# Patient Record
Sex: Male | Born: 1935 | Race: White | Hispanic: No | Marital: Married | Smoking: Never smoker
Health system: Southern US, Community
[De-identification: ages and names within clinical notes are randomized; demographics above are authoritative.]

## PROBLEM LIST (undated history)

## (undated) DIAGNOSIS — I10 Essential (primary) hypertension: Secondary | ICD-10-CM

## (undated) DIAGNOSIS — K279 Peptic ulcer, site unspecified, unspecified as acute or chronic, without hemorrhage or perforation: Secondary | ICD-10-CM

## (undated) DIAGNOSIS — Z86010 Personal history of colonic polyps: Secondary | ICD-10-CM

## (undated) DIAGNOSIS — R972 Elevated prostate specific antigen [PSA]: Secondary | ICD-10-CM

## (undated) DIAGNOSIS — K648 Other hemorrhoids: Secondary | ICD-10-CM

## (undated) DIAGNOSIS — J383 Other diseases of vocal cords: Secondary | ICD-10-CM

## (undated) DIAGNOSIS — G252 Other specified forms of tremor: Secondary | ICD-10-CM

## (undated) DIAGNOSIS — R498 Other voice and resonance disorders: Secondary | ICD-10-CM

## (undated) DIAGNOSIS — M5416 Radiculopathy, lumbar region: Secondary | ICD-10-CM

## (undated) DIAGNOSIS — M545 Low back pain: Secondary | ICD-10-CM

## (undated) DIAGNOSIS — M21371 Foot drop, right foot: Secondary | ICD-10-CM

## (undated) DIAGNOSIS — I2510 Atherosclerotic heart disease of native coronary artery without angina pectoris: Secondary | ICD-10-CM

## (undated) DIAGNOSIS — R05 Cough: Secondary | ICD-10-CM

## (undated) DIAGNOSIS — G250 Essential tremor: Secondary | ICD-10-CM

## (undated) DIAGNOSIS — Z0000 Encounter for general adult medical examination without abnormal findings: Secondary | ICD-10-CM

## (undated) DIAGNOSIS — E785 Hyperlipidemia, unspecified: Secondary | ICD-10-CM

## (undated) DIAGNOSIS — J309 Allergic rhinitis, unspecified: Secondary | ICD-10-CM

## (undated) DIAGNOSIS — G5733 Lesion of lateral popliteal nerve, bilateral lower limbs: Secondary | ICD-10-CM

## (undated) DIAGNOSIS — R7302 Impaired glucose tolerance (oral): Secondary | ICD-10-CM

## (undated) DIAGNOSIS — F411 Generalized anxiety disorder: Secondary | ICD-10-CM

## (undated) HISTORY — DX: Impaired glucose tolerance (oral): R73.02

## (undated) HISTORY — DX: Personal history of colonic polyps: Z86.010

## (undated) HISTORY — DX: Essential (primary) hypertension: I10

## (undated) HISTORY — DX: Other diseases of vocal cords: J38.3

## (undated) HISTORY — DX: Atherosclerotic heart disease of native coronary artery without angina pectoris: I25.10

## (undated) HISTORY — DX: Other voice and resonance disorders: R49.8

## (undated) HISTORY — DX: Foot drop, right foot: M21.371

## (undated) HISTORY — DX: Lesion of lateral popliteal nerve, bilateral lower limbs: G57.33

## (undated) HISTORY — DX: Hyperlipidemia, unspecified: E78.5

## (undated) HISTORY — DX: Other hemorrhoids: K64.8

## (undated) HISTORY — PX: TONSILLECTOMY: SUR1361

## (undated) HISTORY — DX: Radiculopathy, lumbar region: M54.16

## (undated) HISTORY — DX: Other specified forms of tremor: G25.2

## (undated) HISTORY — DX: Peptic ulcer, site unspecified, unspecified as acute or chronic, without hemorrhage or perforation: K27.9

## (undated) HISTORY — PX: APPENDECTOMY: SHX54

## (undated) HISTORY — PX: OTHER SURGICAL HISTORY: SHX169

## (undated) HISTORY — DX: Cough: R05

## (undated) HISTORY — PX: CORONARY ARTERY BYPASS GRAFT: SHX141

## (undated) HISTORY — PX: BACK SURGERY: SHX140

## (undated) HISTORY — DX: Essential tremor: G25.0

## (undated) HISTORY — DX: Elevated prostate specific antigen (PSA): R97.20

## (undated) HISTORY — DX: Encounter for general adult medical examination without abnormal findings: Z00.00

## (undated) HISTORY — DX: Generalized anxiety disorder: F41.1

## (undated) HISTORY — DX: Low back pain: M54.5

## (undated) HISTORY — DX: Allergic rhinitis, unspecified: J30.9

---

## 1998-03-13 ENCOUNTER — Ambulatory Visit (HOSPITAL_COMMUNITY): Admission: RE | Admit: 1998-03-13 | Discharge: 1998-03-13 | Payer: Self-pay | Admitting: Cardiology

## 1998-10-30 ENCOUNTER — Ambulatory Visit (HOSPITAL_BASED_OUTPATIENT_CLINIC_OR_DEPARTMENT_OTHER): Admission: RE | Admit: 1998-10-30 | Discharge: 1998-10-30 | Payer: Self-pay | Admitting: General Surgery

## 2000-03-26 ENCOUNTER — Encounter: Payer: Self-pay | Admitting: Cardiology

## 2000-03-26 ENCOUNTER — Ambulatory Visit (HOSPITAL_COMMUNITY): Admission: RE | Admit: 2000-03-26 | Discharge: 2000-03-26 | Payer: Self-pay | Admitting: Cardiology

## 2002-05-14 ENCOUNTER — Encounter: Payer: Self-pay | Admitting: Internal Medicine

## 2002-05-14 ENCOUNTER — Encounter: Admission: RE | Admit: 2002-05-14 | Discharge: 2002-05-14 | Payer: Self-pay | Admitting: Internal Medicine

## 2002-07-22 ENCOUNTER — Encounter: Payer: Self-pay | Admitting: Neurosurgery

## 2002-07-26 ENCOUNTER — Inpatient Hospital Stay (HOSPITAL_COMMUNITY): Admission: RE | Admit: 2002-07-26 | Discharge: 2002-07-27 | Payer: Self-pay | Admitting: Neurosurgery

## 2002-07-26 ENCOUNTER — Encounter: Payer: Self-pay | Admitting: Neurosurgery

## 2003-03-23 ENCOUNTER — Encounter: Payer: Self-pay | Admitting: Gastroenterology

## 2004-10-18 ENCOUNTER — Ambulatory Visit: Payer: Self-pay | Admitting: Internal Medicine

## 2004-10-25 ENCOUNTER — Ambulatory Visit: Payer: Self-pay | Admitting: Internal Medicine

## 2004-11-22 ENCOUNTER — Ambulatory Visit: Payer: Self-pay | Admitting: Internal Medicine

## 2004-12-25 ENCOUNTER — Ambulatory Visit: Payer: Self-pay | Admitting: Internal Medicine

## 2005-11-12 ENCOUNTER — Ambulatory Visit: Payer: Self-pay | Admitting: Internal Medicine

## 2005-11-21 ENCOUNTER — Ambulatory Visit: Payer: Self-pay | Admitting: Internal Medicine

## 2005-12-24 ENCOUNTER — Ambulatory Visit: Payer: Self-pay | Admitting: Cardiology

## 2006-11-18 ENCOUNTER — Ambulatory Visit: Payer: Self-pay | Admitting: Internal Medicine

## 2006-11-18 ENCOUNTER — Ambulatory Visit: Payer: Self-pay

## 2006-11-18 ENCOUNTER — Ambulatory Visit: Payer: Self-pay | Admitting: Cardiology

## 2006-11-18 LAB — CONVERTED CEMR LAB
Bilirubin, Direct: 0.1 mg/dL (ref 0.0–0.3)
CO2: 29 meq/L (ref 19–32)
Creatinine, Ser: 1 mg/dL (ref 0.4–1.5)
HDL: 49.9 mg/dL (ref >39.0)
PSA: 1.06 ng/mL (ref 0.10–4.00)
Potassium: 4.8 meq/L (ref 3.5–5.1)
Sodium: 141 meq/L (ref 135–145)
Total Bilirubin: 1.2 mg/dL (ref 0.3–1.2)
Total CHOL/HDL Ratio: 3
Total Protein: 6.2 g/dL (ref 6.0–8.3)
Triglycerides: 61 mg/dL (ref 0–149)

## 2006-12-11 ENCOUNTER — Ambulatory Visit: Payer: Self-pay | Admitting: Cardiology

## 2007-03-03 ENCOUNTER — Encounter: Admission: RE | Admit: 2007-03-03 | Discharge: 2007-03-03 | Payer: Self-pay | Admitting: Neurosurgery

## 2007-04-08 ENCOUNTER — Inpatient Hospital Stay (HOSPITAL_COMMUNITY): Admission: RE | Admit: 2007-04-08 | Discharge: 2007-04-09 | Payer: Self-pay | Admitting: Neurosurgery

## 2007-10-05 IMAGING — CR DG LUMBAR SPINE 2-3V
1 series · 1 of 1 positions shown · non-contrast
Comparison: none

CLINICAL DATA: Lumbar stenosis, L4-5 decompression.
 LUMBAR SPINE ? 2 VIEW:

[view not recorded]
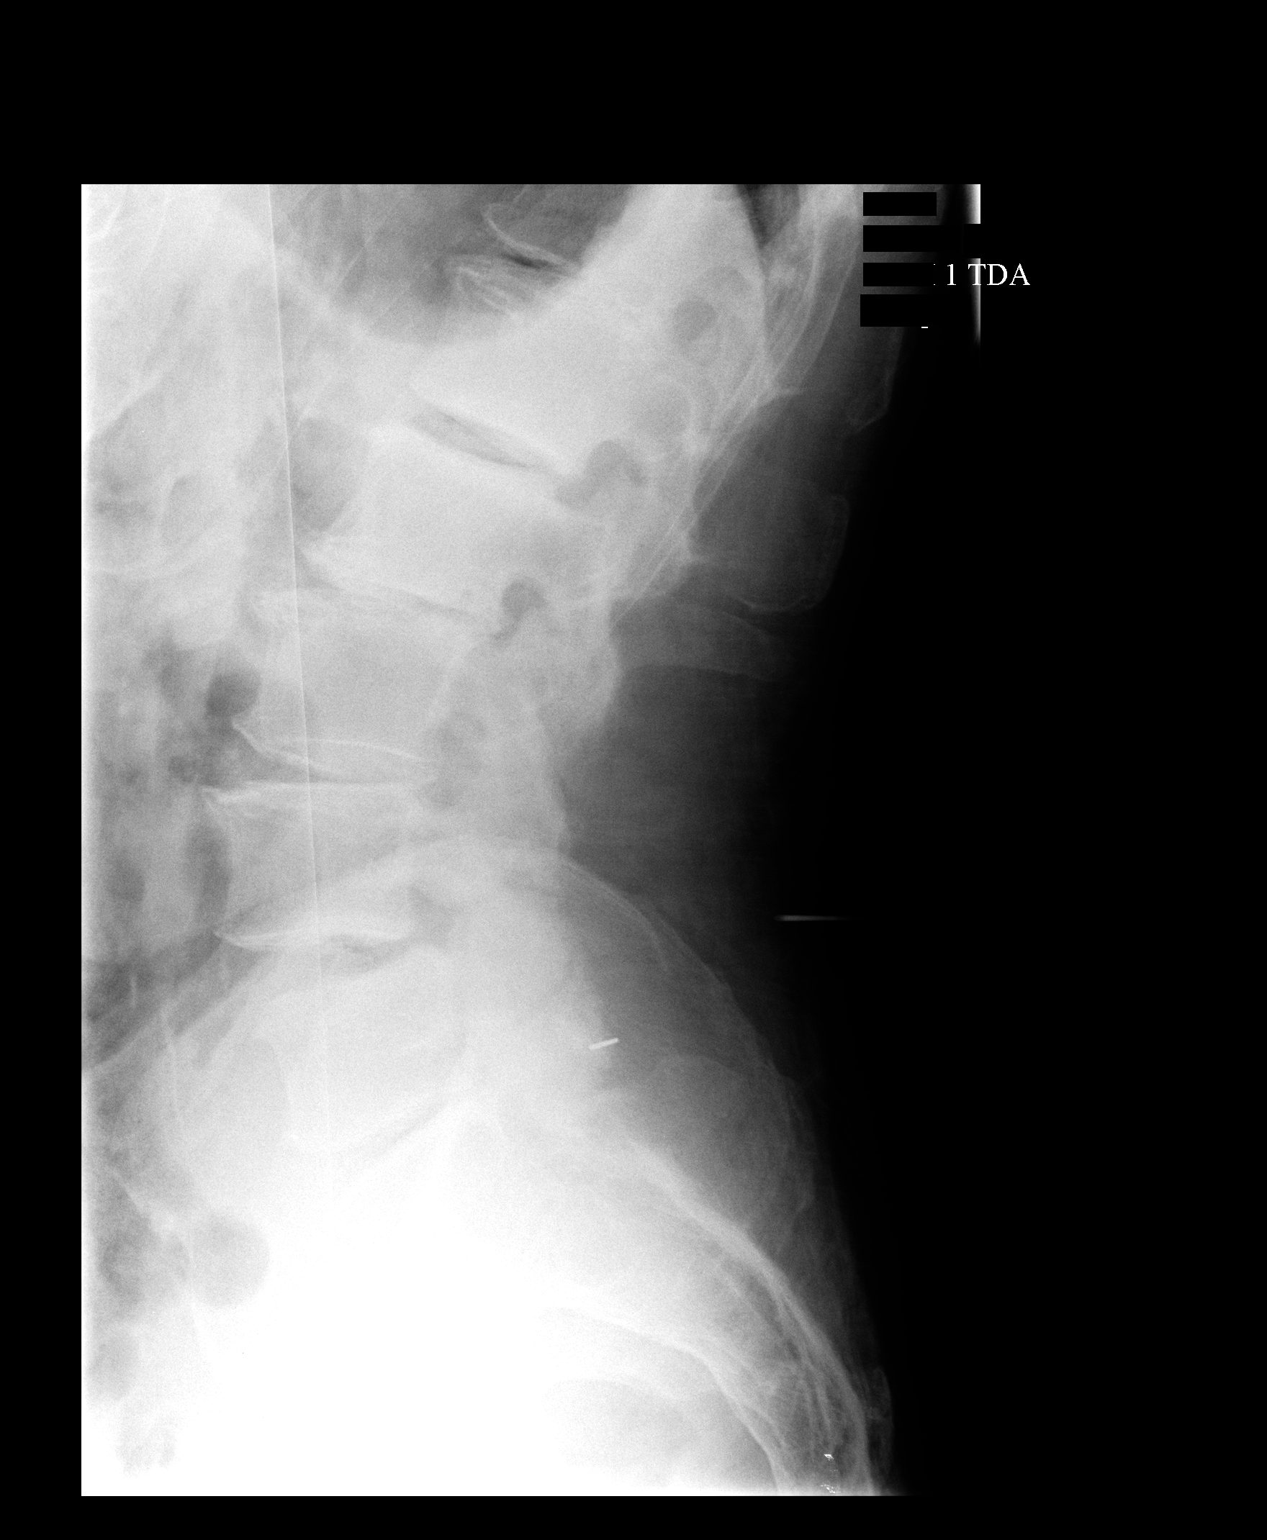

[1 of 1 positions shown; findings below may reference images not displayed]

FINDINGS: Two lateral intraoperative views of the lumbar spine are submitted postoperatively for interpretation.
 Film #1 demonstrates posterior metallic probe directed at the L4-5 interspace.
 Film #2 demonstrates a posterior metallic probe directed at the inferior aspect of L4.
IMPRESSION: Localization.

## 2007-11-25 ENCOUNTER — Ambulatory Visit: Payer: Self-pay | Admitting: Internal Medicine

## 2007-11-25 DIAGNOSIS — I2510 Atherosclerotic heart disease of native coronary artery without angina pectoris: Secondary | ICD-10-CM

## 2007-11-25 DIAGNOSIS — E785 Hyperlipidemia, unspecified: Secondary | ICD-10-CM

## 2007-11-25 DIAGNOSIS — G252 Other specified forms of tremor: Secondary | ICD-10-CM

## 2007-11-25 DIAGNOSIS — Z860100 Personal history of colon polyps, unspecified: Secondary | ICD-10-CM

## 2007-11-25 DIAGNOSIS — F411 Generalized anxiety disorder: Secondary | ICD-10-CM | POA: Insufficient documentation

## 2007-11-25 DIAGNOSIS — J309 Allergic rhinitis, unspecified: Secondary | ICD-10-CM

## 2007-11-25 DIAGNOSIS — G250 Essential tremor: Secondary | ICD-10-CM

## 2007-11-25 DIAGNOSIS — K279 Peptic ulcer, site unspecified, unspecified as acute or chronic, without hemorrhage or perforation: Secondary | ICD-10-CM | POA: Insufficient documentation

## 2007-11-25 DIAGNOSIS — I10 Essential (primary) hypertension: Secondary | ICD-10-CM

## 2007-11-25 DIAGNOSIS — M545 Low back pain, unspecified: Secondary | ICD-10-CM

## 2007-11-25 HISTORY — DX: Essential tremor: G25.0

## 2007-11-25 HISTORY — DX: Essential (primary) hypertension: I10

## 2007-11-25 HISTORY — DX: Allergic rhinitis, unspecified: J30.9

## 2007-11-25 HISTORY — DX: Low back pain, unspecified: M54.50

## 2007-11-25 HISTORY — DX: Atherosclerotic heart disease of native coronary artery without angina pectoris: I25.10

## 2007-11-25 HISTORY — DX: Personal history of colon polyps, unspecified: Z86.0100

## 2007-11-25 HISTORY — DX: Personal history of colonic polyps: Z86.010

## 2007-11-25 HISTORY — DX: Peptic ulcer, site unspecified, unspecified as acute or chronic, without hemorrhage or perforation: K27.9

## 2007-11-25 HISTORY — DX: Generalized anxiety disorder: F41.1

## 2007-11-25 HISTORY — DX: Hyperlipidemia, unspecified: E78.5

## 2007-12-01 LAB — CONVERTED CEMR LAB
AST: 29 units/L (ref 0–37)
Bilirubin, Direct: 0.2 mg/dL (ref 0.0–0.3)
Chloride: 103 meq/L (ref 96–112)
Cholesterol: 159 mg/dL (ref 0–200)
Creatinine, Ser: 1 mg/dL (ref 0.4–1.5)
Eosinophils Absolute: 0.2 10*3/uL (ref 0.0–0.6)
Eosinophils Relative: 3.8 % (ref 0.0–5.0)
GFR calc non Af Amer: 77 mL/min
Glucose, Bld: 113 mg/dL — ABNORMAL HIGH (ref 70–99)
HCT: 40.6 % (ref 39.0–52.0)
Ketones, ur: NEGATIVE mg/dL
Leukocytes, UA: NEGATIVE
Lymphocytes Relative: 20 % (ref 12.0–46.0)
MCV: 100.1 fL — ABNORMAL HIGH (ref 78.0–100.0)
Neutrophils Relative %: 63.1 % (ref 43.0–77.0)
Nitrite: NEGATIVE
PSA: 1.2 ng/mL (ref 0.10–4.00)
RBC: 4.06 M/uL — ABNORMAL LOW (ref 4.22–5.81)
Sodium: 140 meq/L (ref 135–145)
Total Bilirubin: 1.4 mg/dL — ABNORMAL HIGH (ref 0.3–1.2)
Total Protein: 7 g/dL (ref 6.0–8.3)
Urobilinogen, UA: 0.2 (ref 0.0–1.0)
WBC: 5.7 10*3/uL (ref 4.5–10.5)

## 2008-06-13 ENCOUNTER — Encounter: Payer: Self-pay | Admitting: Internal Medicine

## 2008-07-29 ENCOUNTER — Telehealth (INDEPENDENT_AMBULATORY_CARE_PROVIDER_SITE_OTHER): Payer: Self-pay | Admitting: *Deleted

## 2008-08-18 ENCOUNTER — Ambulatory Visit: Payer: Self-pay | Admitting: Cardiology

## 2008-08-19 ENCOUNTER — Ambulatory Visit: Payer: Self-pay | Admitting: Cardiology

## 2008-08-19 LAB — CONVERTED CEMR LAB
ALT: 23 units/L (ref 0–53)
Albumin: 3.5 g/dL (ref 3.5–5.2)
Alkaline Phosphatase: 52 units/L (ref 39–117)
BUN: 16 mg/dL (ref 6–23)
Calcium: 9.2 mg/dL (ref 8.4–10.5)
Eosinophils Relative: 4.7 % (ref 0.0–5.0)
GFR calc Af Amer: 93 mL/min
Glucose, Bld: 100 mg/dL — ABNORMAL HIGH (ref 70–99)
HCT: 39.7 % (ref 39.0–52.0)
Hemoglobin: 13.7 g/dL (ref 13.0–17.0)
Monocytes Absolute: 0.6 10*3/uL (ref 0.1–1.0)
Monocytes Relative: 13.1 % — ABNORMAL HIGH (ref 3.0–12.0)
Neutro Abs: 2.7 10*3/uL (ref 1.4–7.7)
RBC: 3.87 M/uL — ABNORMAL LOW (ref 4.22–5.81)
Total CHOL/HDL Ratio: 3.2
Total Protein: 6.3 g/dL (ref 6.0–8.3)
WBC: 4.8 10*3/uL (ref 4.5–10.5)

## 2008-09-06 ENCOUNTER — Encounter: Payer: Self-pay | Admitting: Internal Medicine

## 2008-10-19 ENCOUNTER — Encounter: Payer: Self-pay | Admitting: Internal Medicine

## 2008-11-22 ENCOUNTER — Telehealth: Payer: Self-pay | Admitting: Internal Medicine

## 2009-01-04 ENCOUNTER — Ambulatory Visit: Payer: Self-pay | Admitting: Internal Medicine

## 2009-01-04 DIAGNOSIS — R259 Unspecified abnormal involuntary movements: Secondary | ICD-10-CM | POA: Insufficient documentation

## 2009-01-04 DIAGNOSIS — R498 Other voice and resonance disorders: Secondary | ICD-10-CM

## 2009-01-04 DIAGNOSIS — R05 Cough: Secondary | ICD-10-CM | POA: Insufficient documentation

## 2009-01-04 DIAGNOSIS — R059 Cough, unspecified: Secondary | ICD-10-CM

## 2009-01-04 HISTORY — DX: Cough, unspecified: R05.9

## 2009-01-04 HISTORY — DX: Other voice and resonance disorders: R49.8

## 2009-01-05 ENCOUNTER — Encounter: Payer: Self-pay | Admitting: Internal Medicine

## 2009-01-05 DIAGNOSIS — R972 Elevated prostate specific antigen [PSA]: Secondary | ICD-10-CM

## 2009-01-05 HISTORY — DX: Elevated prostate specific antigen (PSA): R97.20

## 2009-01-06 LAB — CONVERTED CEMR LAB
Albumin: 3.7 g/dL (ref 3.5–5.2)
Alkaline Phosphatase: 48 units/L (ref 39–117)
Basophils Relative: 0.2 % (ref 0.0–3.0)
Bilirubin Urine: NEGATIVE
Bilirubin, Direct: 0.2 mg/dL (ref 0.0–0.3)
Chloride: 105 meq/L (ref 96–112)
Cholesterol: 170 mg/dL (ref 0–200)
Creatinine, Ser: 1.2 mg/dL (ref 0.4–1.5)
Eosinophils Relative: 3.9 % (ref 0.0–5.0)
GFR calc non Af Amer: 62.37 mL/min (ref >60)
HDL: 63.8 mg/dL (ref >39.00)
Ketones, ur: NEGATIVE mg/dL
Lymphocytes Relative: 28.6 % (ref 12.0–46.0)
MCV: 101.3 fL — ABNORMAL HIGH (ref 78.0–100.0)
Monocytes Absolute: 0.6 10*3/uL (ref 0.1–1.0)
Monocytes Relative: 12.8 % — ABNORMAL HIGH (ref 3.0–12.0)
Neutrophils Relative %: 54.5 % (ref 43.0–77.0)
Nitrite: NEGATIVE
Potassium: 4.9 meq/L (ref 3.5–5.1)
RBC: 4.13 M/uL — ABNORMAL LOW (ref 4.22–5.81)
Total Bilirubin: 1.6 mg/dL — ABNORMAL HIGH (ref 0.3–1.2)
Total Protein, Urine: NEGATIVE mg/dL
Triglycerides: 38 mg/dL (ref 0.0–149.0)
Urine Glucose: NEGATIVE mg/dL
VLDL: 7.6 mg/dL (ref 0.0–40.0)
WBC: 4.4 10*3/uL — ABNORMAL LOW (ref 4.5–10.5)
pH: 7 (ref 5.0–8.0)

## 2009-01-17 ENCOUNTER — Encounter: Payer: Self-pay | Admitting: Internal Medicine

## 2009-01-26 ENCOUNTER — Encounter: Payer: Self-pay | Admitting: Internal Medicine

## 2009-01-27 ENCOUNTER — Ambulatory Visit: Payer: Self-pay | Admitting: Gastroenterology

## 2009-02-17 ENCOUNTER — Ambulatory Visit: Payer: Self-pay | Admitting: Gastroenterology

## 2009-02-17 LAB — HM COLONOSCOPY

## 2009-08-03 ENCOUNTER — Encounter: Payer: Self-pay | Admitting: Internal Medicine

## 2009-09-05 ENCOUNTER — Ambulatory Visit: Payer: Self-pay | Admitting: Cardiology

## 2009-09-21 ENCOUNTER — Telehealth: Payer: Self-pay | Admitting: Internal Medicine

## 2010-01-25 ENCOUNTER — Ambulatory Visit: Payer: Self-pay | Admitting: Internal Medicine

## 2010-01-25 LAB — CONVERTED CEMR LAB
ALT: 24 units/L (ref 0–53)
Basophils Absolute: 0 10*3/uL (ref 0.0–0.1)
Bilirubin, Direct: 0.2 mg/dL (ref 0.0–0.3)
CO2: 29 meq/L (ref 19–32)
Eosinophils Absolute: 0.2 10*3/uL (ref 0.0–0.7)
Eosinophils Relative: 3.3 % (ref 0.0–5.0)
GFR calc non Af Amer: 78.57 mL/min (ref >60)
Glucose, Bld: 94 mg/dL (ref 70–99)
HDL: 60.3 mg/dL (ref >39.00)
Leukocytes, UA: NEGATIVE
MCV: 102.9 fL — ABNORMAL HIGH (ref 78.0–100.0)
Monocytes Absolute: 0.6 10*3/uL (ref 0.1–1.0)
Neutrophils Relative %: 54.7 % (ref 43.0–77.0)
Nitrite: NEGATIVE
PSA: 2 ng/mL (ref 0.10–4.00)
Platelets: 203 10*3/uL (ref 150.0–400.0)
Potassium: 4.9 meq/L (ref 3.5–5.1)
Sodium: 141 meq/L (ref 135–145)
Specific Gravity, Urine: 1.015 (ref 1.000–1.030)
Total Bilirubin: 1.3 mg/dL — ABNORMAL HIGH (ref 0.3–1.2)
VLDL: 8.2 mg/dL (ref 0.0–40.0)
WBC: 4.9 10*3/uL (ref 4.5–10.5)
pH: 6.5 (ref 5.0–8.0)

## 2010-01-30 ENCOUNTER — Ambulatory Visit: Payer: Self-pay | Admitting: Internal Medicine

## 2010-02-06 ENCOUNTER — Encounter (INDEPENDENT_AMBULATORY_CARE_PROVIDER_SITE_OTHER): Payer: Self-pay | Admitting: *Deleted

## 2010-03-16 ENCOUNTER — Encounter: Payer: Self-pay | Admitting: Internal Medicine

## 2010-03-29 ENCOUNTER — Encounter
Admission: RE | Admit: 2010-03-29 | Discharge: 2010-06-12 | Payer: Self-pay | Source: Home / Self Care | Admitting: Neurology

## 2010-08-27 ENCOUNTER — Ambulatory Visit: Payer: Self-pay | Admitting: Cardiology

## 2010-08-27 ENCOUNTER — Encounter: Payer: Self-pay | Admitting: Cardiology

## 2010-08-28 ENCOUNTER — Telehealth: Payer: Self-pay | Admitting: Cardiology

## 2010-09-03 ENCOUNTER — Encounter: Payer: Self-pay | Admitting: Internal Medicine

## 2010-10-14 LAB — CONVERTED CEMR LAB: Folate: >20 ng/mL

## 2010-10-16 NOTE — Progress Notes (Signed)
  Phone Note Refill Request  on September 21, 2009 4:36 PM  Refills Requested: Medication #1:  LOPRESSOR 50 MG  TABS 1 and 1/.2 by mouth qam and 1 by mouth qpm   Dosage confirmed as above?Dosage Confirmed   Last Refilled: 05/2009   Notes: [facility] Initial call taken by: Scharlene Gloss,  September 21, 2009 4:37 PM    Prescriptions: LOPRESSOR 50 MG  TABS (METOPROLOL TARTRATE) 1 and 1/.2 by mouth qam and 1 by mouth qpm  #75 Tablet x 4   Entered by:   Scharlene Gloss   Authorized by:   [name] MD   Signed by:   Scharlene Gloss on 09/21/2009   Method used:   Faxed to ...       [facility]  [address] [phone]* (retail)       [address]       [place], [place]  [address]       Ph: [phone]       Fax: [phone]   RxID:   [id number]

## 2010-10-16 NOTE — Consult Note (Signed)
Summary: [facility]  [facility]   Imported By: Sherian Rein 03/22/2010 12:44:08  _____________________________________________________________________  External Attachment:    Type:   Image     Comment:   External Document

## 2010-10-16 NOTE — Letter (Signed)
Summary: [facility] Consult Scheduled Letter  [facility]  [address] [place], [place] [address]   Phone: [phone]  Fax: [phone]      02/06/2010 MRN: [id number]  [facility] [address] [place], [place]  [address]    Dear Ronald [name],      We have scheduled an appointment for you.  At the recommendation of Dr.Mcihael, we have scheduled you a consult with [facility] (Dr.Yan) on June 16,2011 Thur at 10:45 AM .Their phone number is [phone].If this appointment day and time is not convenient for you, please feel free to call the office of the doctor you are being referred to at the number listed above and reschedule the appointment.  *please arrive 43 Min [facility]  [address], [place], [place] [address] [phone]   Thank you,  Patient Care Coordinator [facility]

## 2010-10-16 NOTE — Assessment & Plan Note (Signed)
Summary: CPX/ SECURE HORIZIONS// NWS  #   Vital Signs:  Patient profile:   77 year old male Height:      68 inches Weight:      183.25 pounds BMI:     27.96 O2 Sat:      97 % on Room air Temp:     97.8 degrees F oral Pulse rate:   54 / minute BP sitting:   128 / 74  (left arm) Cuff size:   regular  Vitals Entered ByZella Ball Ewing (Jan 30, 2010 10:29 AM)  O2 Flow:  Room air  CC: CPX/RE   Primary Care Provider:  [name] MD  CC:  CPX/RE.  History of Present Illness: tremors seems worse to the upper extremties in recent months despite the beta blocker with good compliacne for the lsat several years;  worse with "sensitve times" such as signing the name, drinking toasts of wine, and putting on the golf course;  also with chronic RLE weakness down to 3% due to  peroneal nerve damage;  still goes to the club daily with walking at least a mile per day, and some machine work; pain to the lower back some worse recently with more walking, but can ride the stationary bike without pain - stamina seems ok and no Pt denies CP, sob, doe, wheezing, orthopnea, pnd, worsening LE edema, palps, dizziness or syncope .  Also saw Dr Retta Diones for prostate  Wife ill iwth lung cancer for 1.5 yrs with mult treatment - he denies signifcant depressive symptoms or panic, sleeps well.    Preventive Screening-Counseling & Management      Drug Use:  no.    Problems Prior to Update: 1)  Tremor  (ICD-781.0) 2)  Coronary Artery Disease  (ICD-414.00) 3)  Hyperlipidemia  (ICD-272.4) 4)  Hypertension  (ICD-401.9) 5)  Psa, Increased  (ICD-790.93) 6)  Tremor  (ICD-781.0) 7)  Hoarseness  (ICD-784.49) 8)  Cough  (ICD-786.2) 9)  Preventive Health Care  (ICD-V70.0) 10)  Low Back Pain  (ICD-724.2) 11)  Allergic Rhinitis  (ICD-477.9) 12)  Anxiety  (ICD-300.00) 13)  Peptic Ulcer Disease  (ICD-533.90) 14)  Colonic Polyps, Hx of  (ICD-V12.72) 15)  Preventive Health Care  (ICD-V70.0) 16)  Tremor, Essential   (ICD-333.1)  Medications Prior to Update: 1)  Lopressor 50 Mg  Tabs (Metoprolol Tartrate) .[name].. 1 and 1/.2 By Mouth Qam and 1 By Mouth Qpm 2)  Adult Aspirin Low Strength 81 Mg  Tbdp (Aspirin) .... Take 1 Tablet By Mouth Once A Day 3)  Lisinopril 20 Mg  Tabs (Lisinopril) .... Take 1 Tablet By Mouth Two Times A Day 4)  Crestor 40 Mg Tabs (Rosuvastatin Calcium) .... 1/2  By Mouth Once Daily 5)  Omega 3 1200 Mg  Caps (Omega-3 Fatty Acids) .... Take 3 Tablet By Mouth Once A Day 6)  Calcium 1200 + Vitamin D 250 .... Take 3 Tablet By Mouth Once A Day 7)  Vitamin B-12 1000 Mcg Tabs (Cyanocobalamin) .[name].. 1 By Mouth Once Daily 8)  Multi V .... 1 Tab Once Daily  Current Medications (verified): 1)  Lopressor 50 Mg  Tabs (Metoprolol Tartrate) .[name].. 1 and 1/.2 By Mouth Qam and 1 By Mouth Qpm 2)  Adult Aspirin Low Strength 81 Mg  Tbdp (Aspirin) .... Take 1 Tablet By Mouth Once A Day 3)  Lisinopril 20 Mg  Tabs (Lisinopril) .... Take 1 Tablet By Mouth Two Times A Day 4)  Crestor 40 Mg Tabs (Rosuvastatin Calcium) .Marland KitchenMarland KitchenMarland Kitchen  1/2  By Mouth Once Daily 5)  Omega 3 1200 Mg  Caps (Omega-3 Fatty Acids) .... Take 3 Tablet By Mouth Once A Day 6)  Calcium 1200 + Vitamin D 250 .... Take 3 Tablet By Mouth Once A Day 7)  Vitamin B-12 1000 Mcg Tabs (Cyanocobalamin) .[name].. 1 By Mouth Once Daily 8)  Multi V .... 1 Tab Once Daily  Allergies (verified): 1)  ! Lipitor 2)  ! Zocor 3)  ! * Niaspan  Past History:  Past Surgical History: Last updated: 11/25/2007 s/p lumbar laminectomy and fusion Appendectomy Tonsillectomy Coronary artery bypass graft  Family History: Last updated: 11/25/2007 father with colon cancer  Social History: Last updated: 01/30/2010 Never Smoked Alcohol use-yes son is Administrator, arts in [place] Drug use-no  Risk Factors: Smoking Status: never (11/25/2007)  Past Medical History: tremor Hyperlipidemia Hypertension Colonic polyps, hx of lumbar spinal stenosis Coronary artery disease Peptic  ulcer disease hx of h pylori tx chronic RLE weakness/right foot drop Anxiety Allergic rhinitis Low back pain - chronic 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 Coronary artery disease status post coronary bypass surgery in 1998     with documented occlusion of the vein graft to the right coronary     artery and the vein graft to the diagonal branch of the left     anterior descending coronary artery, but with nonobstructive     disease of the native vessels. 2. Good left ventricular function. 3. Hyperlipidemia. 4. Intolerance to LIPITOR due to elevated CKs. 5. Lumbar spine disease status post laminectomy and fusion, July 2009.   Social History: Reviewed history from 01/04/2009 and no changes required. Never Smoked Alcohol use-yes son is Administrator, arts in [place] Drug use-no Drug Use:  no  Review of Systems  The patient denies anorexia, fever, vision loss, decreased hearing, hoarseness, chest pain, syncope, dyspnea on exertion, peripheral edema, prolonged cough, headaches, hemoptysis, abdominal pain, melena, hematochezia, severe indigestion/heartburn, hematuria, muscle weakness, suspicious skin lesions, transient blindness, difficulty walking, depression, unusual weight change, abnormal bleeding, enlarged lymph nodes, and angioedema.         all otherwise negative per pt -    Physical Exam  General:  alert and well-developed.   Head:  normocephalic and atraumatic.   Eyes:  vision grossly intact, pupils equal, and pupils round.   Ears:  R ear normal and L ear normal.   Nose:  no external deformity and no nasal discharge.   Mouth:  no gingival abnormalities and pharynx pink and moist.   Neck:  supple and no masses.   Lungs:  normal respiratory effort and normal breath sounds.   Heart:  normal rate and regular rhythm.   Abdomen:  soft, non-tender, and normal bowel sounds.   Msk:  no joint tenderness and no joint swelling.     Extremities:  no edema, no erythema  Neurologic:  cranial nerves II-XII intact and strength normal in all extremities.  , head and bilat upper extrem's tremor noted   Impression & Recommendations:  Problem # 1:  Preventive Health Care (ICD-V70.0)  Overall doing well, age appropriate education and counseling updated and referral for appropriate preventive services done unless declined, immunizations up to date or declined, diet counseling done if overweight, urged to quit smoking if smokes , most recent labs reviewed and current ordered if appropriate, ecg reviewed or declined (interpretation per ECG scanned in the EMR if done); information regarding Medicare Prevention requirements given if appropriate; speciality referrals  updated as appropriate   Orders: EKG w/ Interpretation (93000)  Problem # 2:  HYPERLIPIDEMIA (ICD-272.4)  His updated medication list for this problem includes:    Crestor 40 Mg Tabs (Rosuvastatin calcium) .[name]... 1/2  by mouth once daily tolerated current dose, but has been intolerant of mult other meds, declines increase statin  Problem # 3:  TREMOR (ICD-781.0)  worsening, also noted macrocytosis today - to check b12, refer neurology  Orders: Neurology Referral (Neuro) TLB-B12 + Folate Pnl ([id number])  Complete Medication List: 1)  Lopressor 50 Mg Tabs (Metoprolol tartrate) .[name].. 1 and 1/.2 by mouth qam and 1 by mouth qpm 2)  Adult Aspirin Low Strength 81 Mg Tbdp (Aspirin) .... Take 1 tablet by mouth once a day 3)  Lisinopril 20 Mg Tabs (Lisinopril) .... Take 1 tablet by mouth two times a day 4)  Crestor 40 Mg Tabs (Rosuvastatin calcium) .... 1/2  by mouth once daily 5)  Omega 3 1200 Mg Caps (Omega-3 fatty acids) .... Take 3 tablet by mouth once a day 6)  Calcium 1200 + Vitamin D 250  .... Take 3 tablet by mouth once a day 7)  Vitamin B-12 1000 Mcg Tabs (Cyanocobalamin) .[name].. 1 by mouth once daily 8)  Multi V  .... 1 tab once daily  Patient  Instructions: 1)  Please go to the Lab in the basement for your blood  tests today  - the b12 level 2)  You will be contacted about the referral(s) to: neurology 3)  Continue all previous medications as before this visit 4)  Please schedule a follow-up appointment in 1 year or sooner if needed 5)  remember, you only take HALF of the crestor Prescriptions: CRESTOR 40 MG TABS (ROSUVASTATIN CALCIUM) 1  by mouth once daily  #90 x 3   Entered and Authorized by:   [name] MD   Signed by:   [name] MD on 01/30/2010   Method used:   Print then Give to Patient   RxID:   [id number]

## 2010-10-18 NOTE — Letter (Signed)
Summary: [facility]  [facility]   Imported By: Lester Bartelso 09/11/2010 08:50:01  _____________________________________________________________________  External Attachment:    Type:   Image     Comment:   External Document

## 2010-10-18 NOTE — Progress Notes (Signed)
Summary: disability papers    Phone Note Call from Patient Call back at Home Phone [phone]   Caller: Patient Reason for Call: Talk to Nurse Summary of Call: Pt states the doctor gave him the wrong disability papers with the wrong name. Pt would like to have papers sent to his home.  Initial call taken by: Roe Coombs,  August 28, 2010 8:11 AM  Follow-up for Phone Call        Pt was in the office yesterday and was given a handicap form for Rosine Beat Horner and not him.  He is going to mail back the wrong form and would like the correct form done and mailed to him  will have Dr bridie sign on Friday and mail to pt  Pt aware Dennis Bast, RN, BSN  August 28, 2010 8:29 AM

## 2010-10-18 NOTE — Assessment & Plan Note (Signed)
Summary: [name]      Allergies Added:   Visit Type:  Follow-up Primary Ronald [name]:  Ronald Levins MD   History of Present Illness: The patient is 77 years old and return for management of CAD. He had bypass surgery in 1998. His last catheterization was in 2003 at which time he had total occlusion of the vein graft to the right coronary and good LV function. Get a negative Myoview in 2008.  He's been doing quite well has had no recent chest pain shortness of breath or palpitations. Despite limitations from his back problem he has been able to exercise regularly without symptoms.  his back problem is his major limitation.  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 His wife has been diagnosed with lung cancer. She is undergoing chemotherapy.  Current Medications (verified): 1)  Lopressor 50 Mg  Tabs (Metoprolol Tartrate) .[name].. 1 and 1/.2 By Mouth Qam and 1 By Mouth Qpm 2)  Adult Aspirin Low Strength 81 Mg  Tbdp (Aspirin) .... Take 1 Tablet By Mouth Once A Day 3)  Lisinopril 20 Mg  Tabs (Lisinopril) .... Take 1 Tablet By Mouth Two Times A Day 4)  Crestor 40 Mg Tabs (Rosuvastatin Calcium) .... 1/2  By Mouth Once Daily 5)  Omega 3 1200 Mg  Caps (Omega-3 Fatty Acids) .... Take 3 Tablet By Mouth Once A Day 6)  Vitamin B-12 1000 Mcg Tabs (Cyanocobalamin) .[name].. 1 By Mouth Once Daily 7)  Multi V .... 1 Tab Once Daily  Allergies (verified): 1)  ! Lipitor 2)  ! Zocor 3)  ! * Niaspan  Past History:  Past Medical History: Reviewed history from 01/30/2010 and no changes required. 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 Coronary artery disease status post coronary bypass surgery in 1998     with documented occlusion of the vein graft to the right coronary     artery and the vein graft to the diagonal branch of the left     anterior descending coronary artery, but with nonobstructive     disease of the native vessels. 2. Good left ventricular function. 3. Hyperlipidemia. 4. Intolerance to LIPITOR due to elevated CKs. 5. Lumbar spine disease status post laminectomy and fusion, July 2009.   Review of Systems       ROS is negative except as outlined in HPI.   Vital Signs:  Patient profile:   77 year old male Height:      68 inches Weight:      185 pounds Pulse rate:   76 / minute Pulse rhythm:   regular BP sitting:   142 / 70  (right arm)  Vitals Entered By: Jacquelin Hawking, CMA (August 27, 2010 4:21 PM)  Physical Exam  Additional Exam:  Gen. Well-nourished, in no distress   Neck: No JVD, thyroid not enlarged, no carotid bruits Lungs: No tachypnea, clear without rales, rhonchi or wheezes Cardiovascular: Rhythm regular, PMI not displaced,  heart sounds  normal, no murmurs or gallops, no peripheral edema, pulses normal in all 4 extremities. Abdomen: BS normal, abdomen soft and non-tender without masses or organomegaly, no hepatosplenomegaly. MS: No deformities, no  cyanosis or clubbing   Neuro:  No focal sns   Skin:  no lesions    Impression & Recommendations:  Problem # 1:  CORONARY ARTERY DISEASE (ICD-414.00)  He had previous bypass surgery and has one known occluded graft. He is quite active and has had no symptoms of chest pain. This problem appears stable. His updated medication list for this problem includes:    Lopressor 50 Mg Tabs (Metoprolol tartrate) .[name]... 1 and 1/.2 by mouth qam and 1 by mouth qpm    Adult Aspirin Low Strength 81 Mg Tbdp (Aspirin) .[name]... Take 1 tablet by mouth once a day    Lisinopril 20 Mg Tabs (Lisinopril) .[name]... Take 1 tablet by  mouth two times a day  Orders: EKG w/ Interpretation (93000)  Problem # 2:  HYPERTENSION (ICD-401.9) This is well controlled on current medications. His updated medication list for this problem includes:    Lopressor 50 Mg Tabs (Metoprolol tartrate) .[name]... 1 and 1/.2 by mouth qam and 1 by mouth qpm    Adult Aspirin Low Strength 81 Mg Tbdp (Aspirin) .[name]... Take 1 tablet by mouth once a day    Lisinopril 20 Mg Tabs (Lisinopril) .[name]... Take 1 tablet by mouth two times a day  Problem # 3:  HYPERLIPIDEMIA (ICD-272.4) His last lipid profile about 6 months ago was very good. We will continue current therapy. His updated medication list for this problem includes:    Crestor 40 Mg Tabs (Rosuvastatin calcium) .[name]... 1/2  by mouth once daily  Patient Instructions: 1)  Your physician recommends that you continue on your current medications as directed. Please refer to the Current Medication list given to you today. 2)  Your physician wants you to follow-up in: 1 year with Dr. Excell Seltzer.  You will receive a reminder letter in the mail two months in advance. If you don't receive a letter, please call our office to schedule the follow-up appointment.

## 2010-11-06 ENCOUNTER — Telehealth: Payer: Self-pay | Admitting: Internal Medicine

## 2010-11-06 DIAGNOSIS — K648 Other hemorrhoids: Secondary | ICD-10-CM | POA: Insufficient documentation

## 2010-11-06 HISTORY — DX: Other hemorrhoids: K64.8

## 2010-11-13 NOTE — Progress Notes (Signed)
Summary: OV?   Phone Note Call from Patient Call back at [facility] Phone [phone]   Summary of Call: Pt left vm - wants apt for eval of hemorrhoids if Dr Jonny Ruiz would see him for this issue.  Initial call taken by: Lamar Sprinkles, CMA,  November 06, 2010 10:01 AM  Follow-up for Phone Call        we can, but I should say if any surgury is involved, we may need to refer him to a surgeon Follow-up by: [name] MD,  November 06, 2010 11:30 AM  Additional Follow-up for Phone Call Additional follow up Details #1::        Pt wants referral to Dr Abbey Chatters at [facility]. Per pt Dr Abbey Chatters did previous hemorrhoid surgery 6 years ago. Pt will expect call from [facility] with appt info Additional Follow-up by: Margaret Pyle, CMA,  November 06, 2010 11:54 AM  New Problems: HEMORRHOIDS, INTERNAL (ICD-455.0)   Additional Follow-up for Phone Call Additional follow up Details #2::    ok - wil do Follow-up by: [name] MD,  November 06, 2010 1:17 PM  New Problems: HEMORRHOIDS, INTERNAL (ICD-455.0)

## 2010-11-19 ENCOUNTER — Telehealth: Payer: Self-pay | Admitting: Internal Medicine

## 2010-11-27 NOTE — Progress Notes (Signed)
Summary: REFERRAL   Phone Note Call from Patient   Summary of Call: Pt is set up for [facility] at the end of the month. Pt learned about a banding procedure by Dr Velna Ochs and are willing to see pt. If ok, MD needs to send referral to Dr Kinnie Scales for hemorrhoid banding procedure, including copies of last colonoscopy.  Initial call taken by: Lamar Sprinkles, CMA,  November 19, 2010 9:45 AM  Follow-up for Phone Call        ok - I will do referral  dahlia to fax results last colonscopy Follow-up by: [name] MD,  November 19, 2010 10:12 AM  Additional Follow-up for Phone Call Additional follow up Details #1::        results faxed Additional Follow-up by: Margaret Pyle, CMA,  November 19, 2010 11:34 AM

## 2011-01-15 ENCOUNTER — Other Ambulatory Visit: Payer: Self-pay | Admitting: Internal Medicine

## 2011-01-29 NOTE — Assessment & Plan Note (Signed)
[facility]                            CARDIOLOGY OFFICE NOTE   CARVIN, ALMAS                        MRN:          [id number]  DATE:08/18/2008                            DOB:          October 26, 1929    PRIMARY CARE PHYSICIAN:  [name], MD   Mr. Khawaja is a 77 year old who returned for management of his coronary  heart disease.  He had bypass surgery and was last cathed in 2003, at  which time he had occlusion of the vein graft to the right coronary  artery, but it is mainly circulation and only nonobstructive disease.  He had a Myoview scan in 2008, which showed no evidence of ischemia and  good LV function.   He has done very well from the standpoint of his heart.  He had no chest  pain, shortness breath, or palpitations.  He works out regularly at [employer].   His past medical history is significant for lumbar spine disease.  In  July, he had a lumbar laminectomy and fusion with Dr. Newell Coral.  He has  a footdrop on his right, which is not recovering, and he indicated that  he may have to have additional surgery.  His past history is also  significant for hyperlipidemia.   SOCIAL HISTORY:  Mr. 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  He exercises regularly.  He does not smoke.   ALLERGIES:  He has an intolerance to LIPITOR, but is tolerating Crestor  well.   CURRENT MEDICATIONS:  1. Aspirin 81 mg daily.  2. Lisinopril 20 mg daily.  3. Crestor 10 mg daily.  4. Omega-3.  5. Glucosamine.  6. Calcium.  7. Metoprolol 50 mg 2-1/2 tablets daily.   PHYSICAL EXAMINATION:  Blood pressure is 149/74 and the pulse 67 and  regular.  There is no venous distension.  The carotid pulses are full  without bruits.  The chest is clear.  Cardiac rhythm is regular.  No  murmurs or gallops.  The abdomen is soft, normal bowel sounds.  Peripheral pulses are full.  There are no peripheral edema.   Electrocardiogram showed left axis deviation, right bundle-branch block,  and had not changed.   IMPRESSION:  1. Coronary artery disease status post coronary bypass surgery in 1998      with documented occlusion of the vein graft to the right coronary      artery and the vein graft to the diagonal branch of the left      anterior descending coronary artery, but with nonobstructive      disease of the native vessels.  2. Good left ventricular function.  3. Hyperlipidemia.  4. Intolerance to LIPITOR due to elevated CKs.  5. Lumbar spine disease status post laminectomy and fusion, July 2009.   RECOMMENDATIONS:  I think, Mr. Wichmann is doing quite well.  We will plan  to get a fasting lipid, liver, CBC, and BNP tomorrow.  I plan to see him  back in a year.     Bruce Elvera Lennox  Juanda Chance, MD, [facility]  Electronically Signed    BRB/MedQ  DD: 08/18/2008  DT: 08/18/2008  Job #: [id number]

## 2011-01-29 NOTE — Op Note (Signed)
NAMEMarland Kitchen  TRIPP, GOINS NO.:  [id number]   MEDICAL RECORD NO.:  [id number]          PATIENT TYPE:  INP   LOCATION:  3172                         FACILITY:  [facility]   PHYSICIAN:  Hewitt Shorts, M.D.DATE OF BIRTH:  May 24, 1930   DATE OF PROCEDURE:  DATE OF DISCHARGE:                               OPERATIVE REPORT   PREOPERATIVE DIAGNOSES:  1. 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  Multiple      degenerative synovial cysts.  2. Lumbar stenosis.  3. Lumbar spondylosis.  4. Lumbar degenerative disk disease.  5. Lumbar radiculopathy.   POSTOPERATIVE DIAGNOSES:  1. 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  Multiple      degenerative synovial cysts.  2. Lumbar stenosis.  3. Lumbar spondylosis.  4. Lumbar degenerative disk disease.  5. Lumbar radiculopathy.   PROCEDURES:  1. Bilateral L4 and L5 decompressive lumbar laminectomy.  2. Resection of multiple degenerative synovial cysts with      microdissection.  3. Bilateral L4-5 posterior lumbar interbody fusion with ACS peak      interbody implants and VITOSS.  4. Bilateral L4-5 posterior arthrodesis with radius posterior disk      rotation and VITOSS with Infuse.   SURGEON:  Nudelman.   ASSISTANT:  Jerald Kief, NP and Elsner.   ANESTHESIA:  General endotracheal.   HISTORY OF PRESENT ILLNESS:  The patient is a 77 year old man, who is  status post L2 to L5 decompressive lumbar laminectomy nearly 5 years  ago.  He has developed low back and bilateral buttock pain.  Restudy  shows static degenerative L4-5 spondylolisthesis with recurrent stenosis  and multiple synovial cysts, causing severe multifactorial lumbar  stenosis with underlying spondylosis and degenerative disk disease.  A  decision was made to proceed with decompression and arthrodesis at the  L4-5 level.   PROCEDURE:  Patient was brought to the operating room and placed under  general endotracheal anesthesia.  Patient was turned  to a prone  position.  Lumbar region was prepped with Betadine and saline solution,  draped in a sterile fashion.  The midline was then infiltrated with  local anesthetic with epinephrine.  An x-ray was taken, the L4-5 level  identified, and a midline made through the previous midline incision  above the L4-5 level, and carried down to the subcutaneous tissue.  Bipolar cautery and electrocautery was used to maintain hemostasis.  We  dissected down the midline surgical plane through scar tissue and then  began to dissect laterally to either side, identifying the facet  complexes laterally.  We also identified the remaining portion of the L2  spinous process.  We then began to dissect from lateral to medial,  exposing the more medial aspect of the facet and then residual lamina  and dystrophic bone that had filled into the laminectomy defect.  Using  magnification and with microdissection and microsurgical technique, we  exposed the bony edges and separated the scar tissue from the bony edges  of the residual bone and calcified scar tissue.  We were able to  carefully dissect and begin to remove some of the hypertrophic  facet and  then began to expose the lateral margin of the thecal sac.  We  progressively dissected and exposed the L4 and L5 nerve roots  bilaterally.  The residual facets and lamina were severely spondylitic  and hypertrophic and these were carefully removed, taking care to leave  the underlying dura undisturbed.  We found areas of substantial synovial  cyst development over the left L5 nerve root within the origin of the  nerve root foramen and in the lateral aspect of the right side of the  spinal canal, compressing the thecal sac and exiting L4 nerve root and  further synovial cysts laterally within the right L4-5 neural foramen.  In the end, we were able to remove all of the synovial cysts and to  further decompress the thecal sac and exiting nerve roots.  We then   identified the annulus of the L4-5 disk.  It was markedly degenerated  and we proceeded with a bilateral diskectomy at L4-5, incising the  annulus and using a variety of curettes, pituitary rongeurs and  osteophyte removal tools.  In doing so, we were able to remove the  spondylotic overgrowth that was compressing the exiting nerve roots  ventrally.  In the end, we performed a thorough diskectomy at L4-5,  removing all loose fragments, disk material and began to prepare the end  plates for interbody arthrodesis using a variety of paddle curettes to  scrape away the remaining cartilaginous surface down to a good bony  surface.  We then measured the heights of the intervertebral disk spaces  and selected 8 mm implants.  We then used a 10 mL strip of VITOSS that  was saturated with blood and packed two 8 mm implants.  The first  implant was placed on the left side, carefully retracting the thecal sac  and nerve root and then placing the implant in the interbody spacing  countersinking it.  We then went to the right side, packed additional  VITOSS within the midline and then subsequently placed the right-sided  implant, again carefully retracting the thecal sac and nerve root.  Again, the right sided implant was countersunk.  We then draped the C-  arm fluoroscope.  It was brought into the field to provide guidance in  placing the pedicle screws.  Pedicle entry sites were identified  bilaterally at L4 and L5 and each of the pedicles probed.  They were  examined with a ball probe.  No cutouts were found and then each of the  pedicles was tapped with a 5.25 mm tap, again examined with a ball  probe.  No cutouts were found and then we placed 5.75 x 45-mm screws  bilaterally at each level.  The C-arm fluoroscope showed good  positioning of the pedicle screws.  We had previously exposed the  transverse processes of L4 and L5 bilaterally.  They had been  decorticated and then we packed Infuse and  additional VITOSS in the  lateral gutters over the transverse processes and into transverse space.  We again examined the spinal canal, the thecal sac and exiting L4 and L5  nerve roots.  Good decompression of these structures was again confirmed  and then we proceeded with closure.  The deep fascia closed with  interrupted undyed 1 Vicryl sutures.  Scarpa's fascia closed with  interrupted undyed inverted 1 Vicryl sutures and the subcutaneous and  subcuticular layer closed with interrupted inverted 2-0 undyed Vicryl  sutures and the skin was reapproximated with surgical staples  and was  dressed with Adaptic and sterile gauze.  Procedure was tolerated well.  The estimated blood loss was 200 ml.  We did use a Cell Saver during the  procedure, but the CRNA and Cell Saver technician both felt that there  was insufficient blood process to process it.  Sponge and needle count  were correct.  Following surgery, the patient was turned back to the  supine position to be reversed from anesthetic, extubated, and  transferred to the recovery room for further care.      Hewitt Shorts, M.D.  Electronically Signed     RWN/MEDQ  D:  04/08/2007  T:  04/09/2007  Job:  [id number]   cc:   Hewitt Shorts, M.D.

## 2011-01-29 NOTE — H&P (Signed)
NAMEMarland [name]  Ronald [name] NO.:  [id number]   MEDICAL RECORD NO.:  [id number]          PATIENT TYPE:  INP   LOCATION:  2899                         FACILITY:  [facility]   PHYSICIAN:  Hewitt Shorts, M.D.DATE OF BIRTH:  09/25/1929   DATE OF ADMISSION:  04/08/2007  DATE OF DISCHARGE:                              HISTORY & PHYSICAL   HISTORY OF PRESENT ILLNESS:  Patient is a 77 year old right-handed white  male who presented with low back pain extending into the upper buttocks  bilaterally which has been worsening over the past year-and-a-half.  He  is status post L2-L5 decompressive lumbar laminectomy for decompression  of lumbar stenosis in November of 2003.  He had previous surgery with  Dr. Ples Specter on the right side of the L5-S1 in 1975.   The patient was evaluated with x-rays and MRI scan.  The x-rays show the  previous L2-L5 decompressive lumbar laminectomy and show listhesis of L4-  5, retrolisthesis of L2-3 and retrolisthesis of L3-4, all of these are  static to flexion and extension.  There is extensive spondylytic  overgrowth and spurring and multilevel degenerative disc disease.  MRI  scan reconfirms the anterolisthesis of L4-5 with severe facet  arthropathy with recurrent stenosis due to combination of synovial cyst  formation of facet arthropathy as well as the anterolisthesis.  It was  felt that the L2-3 and L3-4 levels remain adequately decompressed and  that his symptoms are from advanced progressive degenerative changes at  L4-5.  He is admitted now for L4-5 lumbar laminectomy, fasciectomy,  microdiskectomy, posterior lumbar interbody fusion with interbody  implants and bone graft, and L4-5 posterior arthrodesis with posterior  instrumentation and bone graft as well as resection of his synovial  cyst, and is now admitted for such.   PAST MEDICAL HISTORY:  Notable for:  1. History of hypertension.  2. Hypercholesterolemia.   No history of  myocardial infarction, cancer, stroke, diabetes, peptic  ulcer disease or lung disease.   PREVIOUS SURGERY:  Includes:  1. His L2-L5 decompression lumbar laminectomy November of 2003.  2. His right L5-S1 discectomy in 1975.  3. Cardiac bypass surgery by Dr. Donata Clay in 1999.   He denies allergies to medications.   CURRENT MEDICATIONS:  Include:  1. Crestor 10 mg once a day.  2. Metoprolol 50 mg once a day.  3. Lisinopril 20 mg b.i.d.  4. Aspirin 81 mg once a day.   FAMILY HISTORY:  His parents have passed on.   SOCIAL HISTORY:  Patient is married.  He is retired.  He does not smoke.  He drinks alcoholic beverages socially.  He denies history of substance  abuse.   REVIEW OF SYSTEMS:  Notable for those that are described in his History  of Present Illness and Past Medical History, but his review of systems  is otherwise unremarkable.   PHYSICAL EXAMINATION:  GENERAL:  The patient is a well-developed, well-  nourished white male in no acute distress.  VITAL SIGNS:  Temperature is 98.2, pulse 66, blood pressure 132/76,  respiratory rate 18, height 5  feet 10 inches, weight 175 pounds.  LUNGS:  Clear to auscultation.  He has symmetric respiratory excursion.  HEART:  Regular rate and rhythm.  Normal S1-S2.  There is no murmur.  EXTREMITY EXAMINATION:  Shows no clubbing, cyanosis or edema.  MUSCULOSKELETAL EXAMINATION:  Shows no tenderness to palpation over the  lumbar spinous process or paralumbar musculature.  He is able to flex to  nearly 90 degrees, able to extend fairly well.  Straight leg raising is  negative bilaterally.  NEUROLOGIC EXAMINATION:  Shows 5/5 strength in the iliopsoas and the  quadriceps bilaterally, however the left dorsiflexion was 4, the right  dorsiflexion was 0 to 1.  The left extensor hallucis longus is 4-, the  right is 0 to 1.  Plantar flexor is 5 bilaterally.  Sensation is intact  to pinprick through the triceps and feet bilaterally.  Reflexes:  The   left quadriceps is absent, the right is trace.  Gastrocnemius are absent  bilaterally.  Toes are equivocal bilaterally.  He has normal gait and  stance.   IMPRESSION:  Patient with low back and upper buttock pain with extensive  spondylosis and degenerative disc disease status post L2-L3  decompressive lumbar laminectomy 4 1/2 years ago.  Current studies show  grade 1 anterolisthesis at L4-5 secondary to severe facet arthropathy  with significant recurrent stenosis at that level due to a combination  of synovial cyst and facet arthropathy and hypertrophy causing dorsal  encroachment of the thecal sac and probable encroachment of the right  side encroaching on the right L5 nerve root.   PLAN:  Patient will be admitted for a decompression of L4-5 with  laminectomy, fasciectomy, resection of synovial cyst, microdiskectomy  and stabilization with posterior lumbar interbody fusion and  posterolateral arthrodesis with interbody implants, posterior  instrumentation and bone graft.  We discussed the nature and extent of  such a surgery using his x-rays, MRI scan and models in the office.  We  discussed the nature of the surgery, hospital stay and overall  recuperation, his limitations postoperatively, the need for  postoperative immobilization, lumbar corset and risks of surgery  including risk of infection, bleeding, risks of nerve root dysfunction  with pain, weakness, numbness or paresthesias, the risk of jaw tear and  CSF leakage and risk of failure of the arthrodesis and anesthetic risk  of myocardial infarction, stroke, pneumonia and death.   Understanding all of this, the patient would like to go ahead with  surgery and is admitted for such.      Hewitt Shorts, M.D.  Electronically Signed     RWN/MEDQ  D:  04/08/2007  T:  04/08/2007  Job:  [id number]

## 2011-02-01 ENCOUNTER — Other Ambulatory Visit: Payer: Self-pay | Admitting: Internal Medicine

## 2011-02-01 NOTE — Op Note (Signed)
NAME:  Ronald [name], Ronald [name] NO.:  [id number]   MEDICAL RECORD NO.:  [id number]                   PATIENT TYPE:  INP   LOCATION:  2866                                 FACILITY:  [facility]   PHYSICIAN:  Hewitt Shorts, M.D.            DATE OF BIRTH:  08/22/1930   DATE OF PROCEDURE:  07/26/2002  DATE OF DISCHARGE:                                 OPERATIVE REPORT   PREOPERATIVE DIAGNOSIS:  Lumbar stenosis, spondylosis, degenerative disk  disease and radiculopathy.   POSTOPERATIVE DIAGNOSIS:  Lumbar stenosis, spondylosis, degenerative disk  disease and radiculopathy.   OPERATION PERFORMED:  L2 to L5 lumbar laminectomy with microdissection.   SURGEON:  Hewitt Shorts, M.D.   ASSISTANT:  Hilda Lias, M.D.   ANESTHESIA:  General endotracheal.   INDICATIONS FOR PROCEDURE:  The patient is a 77 year old man who presented  with bilateral foot drops.  He was found to have evidence of multilevel,  multifactorial lumbar stenosis.  The decision was made to proceed with  decompressive lumbar laminectomy.   DESCRIPTION OF PROCEDURE:  The patient was brought to the operating room and  placed under general endotracheal anesthesia.  The patient was turned to a  prone position and the lumbar region was prepped with Betadine soap and  solution and draped in sterile fashion.  He did have previous lumbar surgery  20 to 25 years ago and the previous lumbar incision was reopened and the  incision was extended rostrally.  An x-ray was taken prior to incision to  help with the localization.  Dissection was carried down to the subcutaneous  tissues.  Bipolar cautery and electrocautery were used to maintain  hemostasis.  Dissection was carried down to the lumbar fascia which incised  bilaterally and the paraspinal muscles dissected from the spinous processes  and lamina in subperiosteal fashion.  Self-retaining retractors were placed.  Another x-ray was taken.  The L2  through L5 spinous processes and laminae  were identified.  Then we proceeded with a multilevel decompressive lumbar  laminectomy using a variety of double action rongeurs, the [facility] Max drill  and Kerrison punches.  The operating microscope was draped and brought into  the field to provide additional magnification, illumination and  visualization and the deepest portion of the decompression was performed  using microdissection and microsurgical technique.  The worst stenosis was  found at the L4-5 level, next worst stenosis at L2-3, and least stenosis at  L3-4.  Spondylitic overgrowth and ligamentum thickening was removed and we  were able to decompress the thecal sac and nerve roots bilaterally from L2  to L5 with decompression of the L2, L3, L4 and L5 nerve roots bilaterally.  Once the decompression was completed, hemostasis was established.  We made a  pledget of Gelfoam in the laminectomy defect.  The Gelfoam was soaked in  thrombin and then we proceeded with closure.  The paraspinal  muscles were  reapproximated with interrupted 1 undyed Vicryl sutures, the deep fascia  closed with interrupted 1 undyed Vicryl sutures, the subcutaneous and  subcuticular layer closed with inverted interrupted 1 and 2-0 undyed Vicryl  sutures and the skin edges were  approximated with Dermabond.  The patient tolerated the procedure well.  The  estimated blood loss was 75 cc.  Sponge and needle count correct.  Following  surgery the patient was to be turned back to supine position, reversed from  anesthetic, extubated and transferred to the recovery room for further care.                                                 Hewitt Shorts, M.D.    RWN/MEDQ  D:  07/26/2002  T:  07/26/2002  Job:  [id number]

## 2011-02-01 NOTE — Assessment & Plan Note (Signed)
[facility]                            CARDIOLOGY OFFICE NOTE   JILES, GOYA                        MRN:          [id number]  DATE:12/11/2006                            DOB:          April 12, 1930    PRIMARY CARE PHYSICIAN:  Dr. [name].   PAST MEDICAL HISTORY:  Mr. 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  He had bypass surgery in 1998  and was cathed in 2003.  He has occlusion of the vein graft to the  diagonal branch of the LAD and the right coronary artery, but his native  circulation has good circulation in those areas.  His LIMA graft is  patent and his circumflex graft was patent.   He has done quite well and has had no angina, shortness of breath, or  palpitations.   PAST MEDICAL HISTORY:  Significant for hyperlipidemia.  He has been  intolerant of LIPITOR due to CK elevation and is now on Crestor.  He has  also had a lumbar laminectomy and has some chronic weakness in his right  leg.   CURRENT MEDICATIONS:  Aspirin.  Metoprolol.  Lisinopril.  Crestor.  Omega 3.   EXAMINATION:  Blood pressure 130/70, pulse 53 and regular.  There is no venous distension.  Carotid pulses were full without bruits.  CHEST:  Clear.  HEART:  Rhythm was regular.  There are no murmurs or gallops.  ABDOMEN:  Soft with normal bowel sounds.  There is no  hepatosplenomegaly.  The peripheral pulses are full and there is no  peripheral edema.   An ECG showed left axis deviation with right bundle branch block.   We did a recent Myoview scan, which showed no evidence of ischemia.  The  ejection fraction was 54%.   IMPRESSION:  1. Coronary artery disease status post coronary artery bypass grafting      surgery in 1998 with documented occlusion of the vein graft to the      right coronary artery and diagonal branch of the left anterior      descending, but with nonobstructive disease in the native      circulation in  those areas, now stable with a negative Myoview      scan.  2. Good left ventricular function.  3. Hyperlipidemia.  4. Elevated CK on Lipitor.  5. 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   RECOMMENDATIONS:  I think Mr. Brassell is doing quite well from the  standpoint of his heart.  He said he is still playing a lot of golf and  that is the most active thing he can do with his arthritic  problems.  His last lipid profile was good and his risk factors appear  to be under good control.  We will plan to see him back in followup in a  year.     Bruce Elvera Lennox Juanda Chance, MD, [facility]  Electronically Signed    BRB/MedQ  DD: 12/11/2006  DT: 12/11/2006  Job #: [id number]

## 2011-02-01 NOTE — Cardiovascular Report (Signed)
[place]  Patient:    Ronald [name], Ronald [name]                        MRN: [id number] Proc. Date: 03/26/00 Adm. Date:  [id number] Disc. Date: [id number] Attending:  Lenoria Farrier CC:         [name], M.D. [facility]             Cardiac Catheterization Lab                        Cardiac Catheterization  INDICATIONS:  Mr. Hatfield has had previous bypass surgery in 1998 and had a catheterization performed in June 1999 at which time, the graft to the diagonal branch of the LAD was occluded, and the vein graft to the distal right coronary artery was occluded, but the native circulation had no major critical blockage.  He has had some persistent left shoulder pain with exertion that is suggestive of angina.  He had a recent Cardiolite scan which suggested some inferobasal ischemia.  We brought him in for a repeat catheterization.  DESCRIPTION OF PROCEDURE:  The procedure was performed from the right femoral artery using an arterial sheath and 6-French preformed coronary catheterizations.  A front wall arterial puncture was performed, and Omnipaque contrast was used.  The vein grafts were injected with a JR4 diagnostic catheter.  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  The LIMA graft was injected with a LIMA graft.  Distal aortography was performed to rule out an abdominal aortic aneurysm.  The patient tolerated the procedure well and left the laboratory in satisfactory condition.  RESULTS: 1. The left main coronary artery was free of significant disease. 2. The left anterior descending artery gave rise to two septal perforators and    a diagonal branch.  After these, there was an 80% stenosis with competing    flow distally. 3. The left circumflex artery was a moderate sized vessel that gave rise to a    first marginal branch and an AV branch and then was  completely occluded.    There was 50% narrowing in the mid portion of the vessel, and there was    competing flow in the first marginal branch, and the distal portion of the    vein graft filled the posterolateral branch. 4. The right coronary artery was a moderately large vessel that gave rise to a    conus branch, two right ventricular branches, a posterior descending    branch, and a posterolateral branch.  There was 30% proximal and 40%    stenosis in the mid vessel. 5. The sequential saphenous vein graft to the marginal and posterolateral    branch of the circumflex artery was patent and functioned normally. 6. The LIMA graft to the mid LAD was patent and functioned normally, and the    distal LAD was free of major obstruction.  The left ventriculogram performed in the RAO projection showed good wall motion with no areas of hypokinesis.  The estimated ejection fraction was 60%.  A distal aortogram was performed which showed no renal artery stenosis and no significant aortoiliac obstruction.  There was no aneurysm.  The aortic pressure was 131/69 with a mean of 93.  The left ventricular was 131/18.  CONCLUSIONS:  Coronary artery disease status post coronary artery bypass graft surgery  in 1998.  The native circulation shows an 80% stenosis in the LAD, a 50% stenosis in the mid circumflex artery, with total occlusion of the posterolateral branch, and 30% proximal and 40% mid stenosis in the right coronary artery.  The grafts show a patent LIMA graft to the mid LAD, a patent sequential vein graft to the marginal and posterolateral branches of the circumflex artery, an occluded vein graft to the diagonal branch of the LAD (old), and an occluded vein graft to the right coronary artery (old).  Left ventricular function is normal.  RECOMMENDATIONS:  The patient has had no progression of disease since the last study.  His overall circulation is quite good.  The only area that may be  no adequately supplied is the diagonal branch to the LAD.  The first diagonal branch does not appear to be threatened by the native circulation, and there may be a small second diagonal branch which has a potential source of ischemia.  Medical management is recommended.DD:  03/26/00 TD:  03/26/00 Job: [id number]

## 2011-02-07 ENCOUNTER — Other Ambulatory Visit: Payer: Self-pay | Admitting: Internal Medicine

## 2011-02-18 ENCOUNTER — Other Ambulatory Visit: Payer: Self-pay | Admitting: Internal Medicine

## 2011-03-01 ENCOUNTER — Other Ambulatory Visit: Payer: Self-pay | Admitting: Internal Medicine

## 2011-03-19 ENCOUNTER — Other Ambulatory Visit: Payer: Self-pay | Admitting: Internal Medicine

## 2011-03-28 ENCOUNTER — Other Ambulatory Visit: Payer: Self-pay | Admitting: Internal Medicine

## 2011-04-07 ENCOUNTER — Encounter: Payer: Self-pay | Admitting: Internal Medicine

## 2011-04-07 DIAGNOSIS — Z0001 Encounter for general adult medical examination with abnormal findings: Secondary | ICD-10-CM | POA: Insufficient documentation

## 2011-04-07 DIAGNOSIS — Z0000 Encounter for general adult medical examination without abnormal findings: Secondary | ICD-10-CM

## 2011-04-07 HISTORY — DX: Encounter for general adult medical examination without abnormal findings: Z00.00

## 2011-04-16 ENCOUNTER — Ambulatory Visit (INDEPENDENT_AMBULATORY_CARE_PROVIDER_SITE_OTHER): Payer: Medicare Other | Admitting: Internal Medicine

## 2011-04-16 ENCOUNTER — Encounter: Payer: Self-pay | Admitting: Internal Medicine

## 2011-04-16 ENCOUNTER — Other Ambulatory Visit (INDEPENDENT_AMBULATORY_CARE_PROVIDER_SITE_OTHER): Payer: Medicare Other

## 2011-04-16 VITALS — BP 130/74 | HR 60 | Temp 98.8°F | Ht 68.0 in | Wt 183.4 lb

## 2011-04-16 DIAGNOSIS — Z23 Encounter for immunization: Secondary | ICD-10-CM

## 2011-04-16 DIAGNOSIS — M5416 Radiculopathy, lumbar region: Secondary | ICD-10-CM | POA: Insufficient documentation

## 2011-04-16 DIAGNOSIS — H9193 Unspecified hearing loss, bilateral: Secondary | ICD-10-CM | POA: Insufficient documentation

## 2011-04-16 DIAGNOSIS — I10 Essential (primary) hypertension: Secondary | ICD-10-CM

## 2011-04-16 DIAGNOSIS — Z0000 Encounter for general adult medical examination without abnormal findings: Secondary | ICD-10-CM

## 2011-04-16 DIAGNOSIS — E785 Hyperlipidemia, unspecified: Secondary | ICD-10-CM

## 2011-04-16 DIAGNOSIS — H9190 Unspecified hearing loss, unspecified ear: Secondary | ICD-10-CM

## 2011-04-16 LAB — CBC WITH DIFFERENTIAL/PLATELET
Basophils Absolute: 0 10*3/uL (ref 0.0–0.1)
Basophils Relative: 0.5 % (ref 0.0–3.0)
HCT: 44.9 % (ref 39.0–52.0)
Hemoglobin: 14.3 g/dL (ref 13.0–17.0)
Lymphs Abs: 1.4 10*3/uL (ref 0.7–4.0)
MCHC: 31.7 g/dL (ref 30.0–36.0)
Monocytes Relative: 13.8 % — ABNORMAL HIGH (ref 3.0–12.0)
Neutro Abs: 2.8 10*3/uL (ref 1.4–7.7)
RBC: 4.33 Mil/uL (ref 4.22–5.81)
RDW: 13.7 % (ref 11.5–14.6)

## 2011-04-16 LAB — URINALYSIS, ROUTINE W REFLEX MICROSCOPIC
Bilirubin Urine: NEGATIVE
Ketones, ur: NEGATIVE
Leukocytes, UA: NEGATIVE
Urine Glucose: NEGATIVE
Urobilinogen, UA: 0.2 (ref 0.0–1.0)

## 2011-04-16 LAB — LIPID PANEL
Cholesterol: 165 mg/dL (ref 0–200)
LDL Cholesterol: 86 mg/dL (ref 0–99)
Total CHOL/HDL Ratio: 2
Triglycerides: 51 mg/dL (ref 0.0–149.0)
VLDL: 10.2 mg/dL (ref 0.0–40.0)

## 2011-04-16 LAB — BASIC METABOLIC PANEL
CO2: 27 mEq/L (ref 19–32)
Calcium: 9 mg/dL (ref 8.4–10.5)
Creatinine, Ser: 1 mg/dL (ref 0.4–1.5)
GFR: 75.64 mL/min (ref >60.00)
Sodium: 138 mEq/L (ref 135–145)

## 2011-04-16 LAB — HEPATIC FUNCTION PANEL
AST: 28 U/L (ref 0–37)
Albumin: 4.2 g/dL (ref 3.5–5.2)
Alkaline Phosphatase: 53 U/L (ref 39–117)
Total Protein: 6.9 g/dL (ref 6.0–8.3)

## 2011-04-16 LAB — TSH: TSH: 2.59 u[IU]/mL (ref 0.35–5.50)

## 2011-04-16 MED ORDER — PNEUMOCOCCAL VAC POLYVALENT 25 MCG/0.5ML IJ INJ
0.5000 mL | INJECTION | Freq: Once | INTRAMUSCULAR | Status: AC
  Administered 2011-04-16: 0.5 mL via INTRAMUSCULAR

## 2011-04-16 NOTE — Progress Notes (Signed)
Subjective:    Patient ID: Ronald [name], male    DOB: 1930/07/30, 77 y.o.   MRN: [id number]  HPI  Here for wellness and f/u;  Overall doing ok;  Pt denies CP, worsening SOB, DOE, wheezing, orthopnea, PND, worsening LE edema, palpitations, dizziness or syncope.  Pt denies neurological change such as new Headache, facial or extremity weakness.  Pt denies polydipsia, polyuria, or low sugar symptoms. Pt states overall good compliance with treatment and medications, good tolerability, and trying to follow lower cholesterol diet.  Pt denies worsening depressive symptoms, suicidal ideation or panic. No fever, wt loss, night sweats, loss of appetite, or other constitutional symptoms.  Pt states good ability with ADL's, low fall risk, home safety reviewed and adequate, no significant changes in hearing or vision, and occasionally active with exercise, does best he can with stationary bike at the gym, just cant walk longer distances (due to spine dz) and running. Also mentions some hearing loss in the last few days - ? wax Past Medical History  Diagnosis Date  . ALLERGIC RHINITIS 11/25/2007  . ANXIETY 11/25/2007  . COLONIC POLYPS, HX OF 11/25/2007  . CORONARY ARTERY DISEASE 11/25/2007  . Cough 01/04/2009  . HEMORRHOIDS, INTERNAL 11/06/2010  . HOARSENESS 01/04/2009  . HYPERLIPIDEMIA 11/25/2007  . HYPERTENSION 11/25/2007  . LOW BACK PAIN 11/25/2007  . PEPTIC ULCER DISEASE 11/25/2007  . Preventative health care 04/07/2011  . PSA, INCREASED 01/05/2009  . TREMOR, ESSENTIAL 11/25/2007  . Lumbar radiculopathy, chronic     with right foot drop  . Right foot drop    Past Surgical History  Procedure Date  . S/p lumbar laminectomy and fusion   . Appendectomy   . Tonsillectomy   . Coronary artery bypass graft     reports that he has never smoked. He does not have any smokeless tobacco history on file. He reports that he drinks alcohol. He reports that he does not use illicit drugs. family history includes Cancer in his  father. Allergies  Allergen Reactions  . Atorvastatin     REACTION: leg pain  . Niacin   . Simvastatin    Current Outpatient Prescriptions on File Prior to Visit  Medication Sig Dispense Refill  . aspirin 81 MG tablet Take 81 mg by mouth daily.        . CRESTOR 40 MG tablet TAKE ONE TABLET BY MOUTH DAILY  30 each  6  . Cyanocobalamin (VITAMIN B 12 PO) Take by mouth daily.        [name] lisinopril (PRINIVIL,ZESTRIL) 20 MG tablet TAKE ONE TABLET TWICE DAILY  60 tablet  6  . metoprolol (LOPRESSOR) 50 MG tablet TAKE 1 & 1/2 TABLETS BY MOUTH EVERY MORNING AND 1 TABLET EVERY EVENING  75 tablet  0  . OMEGA 3 1200 MG CAPS Take 3 tablets by mouth once a day       . rosuvastatin (CRESTOR) 40 MG tablet 1/2 tablet by mouth once daily  30 each  1   Review of Systems Review of Systems  Constitutional: Negative for diaphoresis, activity change, appetite change and unexpected weight change.  HENT: Negative for hearing loss, ear pain, facial swelling, mouth sores and neck stiffness.   Eyes: Negative for pain, redness and visual disturbance.  Respiratory: Negative for shortness of breath and wheezing.   Cardiovascular: Negative for chest pain and palpitations.  Gastrointestinal: Negative for diarrhea, blood in stool, abdominal distention and rectal pain.  Genitourinary: Negative for hematuria, flank pain and decreased urine  volume.  Musculoskeletal: Negative for myalgias and joint swelling.  Skin: Negative for color change and wound.  Neurological: Negative for syncope and numbness. Does still have significant tremor, not using caffeine, hard to hold cup of coffee without spilling in the AM, better later in the day after meds Hematological: Negative for adenopathy.  Psychiatric/Behavioral: Negative for hallucinations, self-injury, decreased concentration and agitation.      Objective:   Physical Exam BP 130/74  Pulse 60  Temp(Src) 98.8 F (37.1 C) (Oral)  Ht 5\' 8"  (1.727 m)  Wt 183 lb 6.4 oz (83.19  kg)  BMI 27.89 kg/m2  SpO2 95% Physical Exam  VS noted Constitutional: Pt is oriented to person, place, and time. Appears well-developed and well-nourished.  HENT:  Head: Normocephalic and atraumatic.  Right Ear: External ear normal.  Left Ear: External ear normal.  Nose: Nose normal.  Mouth/Throat: Oropharynx is clear and moist.  Eyes: Conjunctivae and EOM are normal. Pupils are equal, round, and reactive to light.  Neck: Normal range of motion. Neck supple. No JVD present. No tracheal deviation present.  Cardiovascular: Normal rate, regular rhythm, normal heart sounds and intact distal pulses.   Pulmonary/Chest: Effort normal and breath sounds normal.  Abdominal: Soft. Bowel sounds are normal. There is no tenderness.  Musculoskeletal: Normal range of motion. Exhibits no edema.  Lymphadenopathy:  Has no cervical adenopathy.  Neurological: Pt is alert and oriented to person, place, and time. Pt has normal reflexes. No cranial nerve deficit. 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 No rash noted.  Psychiatric:  Has  normal mood and affect. Behavior is normal. 1+ nervous over wife's lung cancer   Assessment & Plan:

## 2011-04-16 NOTE — Assessment & Plan Note (Signed)
stable overall by hx and exam, most recent data reviewed with pt, and pt to continue medical treatment as before  BP Readings from Last 3 Encounters:  04/16/11 130/74  08/27/10 142/70  01/30/10 128/74

## 2011-04-16 NOTE — Assessment & Plan Note (Signed)
Improved s/p irrigation today 

## 2011-04-16 NOTE — Patient Instructions (Signed)
Your ears were irrigated today You had the pneumonia shot today Continue all other medications as before - please have the pharmacy call if you need refills Please go to LAB in the Basement for the blood and/or urine tests to be done today Please call the phone number [phone] (the PhoneTree System) for results of testing in 2-3 days;  When calling, simply dial the number, and when prompted enter the MRN number above (the Medical Record Number) and the # key, then the message should start. Please return in 1 year for your yearly visit, or sooner if needed, with Lab testing done 3-5 days before

## 2011-04-16 NOTE — Assessment & Plan Note (Addendum)
Overall doing well, age appropriate education and counseling updated, referrals for preventative services and immunizations addressed, dietary and smoking counseling addressed, most recent labs and ECG reviewed.  I have personally reviewed and have noted: 1) the patient's medical and social history 2) The pt's use of alcohol, tobacco, and illicit drugs 3) The patient's current medications and supplements 4) Functional ability including ADL's, fall risk, home safety risk, hearing and visual impairment 5) Diet and physical activities 6) Evidence for depression or mood disorder 7) The patient's height, weight, and BMI have been recorded in the chart I have made referrals, and provided counseling and education based on review of the above Due for pneumovax today

## 2011-04-16 NOTE — Progress Notes (Signed)
Quick Note:  Voice message left on PhoneTree system - lab is negative, normal or otherwise stable, pt to continue same tx ______ 

## 2011-04-18 ENCOUNTER — Other Ambulatory Visit: Payer: Self-pay | Admitting: Internal Medicine

## 2011-05-28 ENCOUNTER — Other Ambulatory Visit: Payer: Self-pay | Admitting: Internal Medicine

## 2011-07-01 LAB — BASIC METABOLIC PANEL
BUN: 16
CO2: 29
Calcium: 9.5
Chloride: 103
Creatinine, Ser: 1.01
GFR calc Af Amer: >60
GFR calc non Af Amer: >60
Glucose, Bld: 109 — ABNORMAL HIGH
Potassium: 5
Sodium: 141

## 2011-07-01 LAB — CBC
HCT: 42.3
Hemoglobin: 14.4
MCHC: 34
MCV: 99.4
Platelets: 247
RBC: 4.26
RDW: 13.1
WBC: 5.9

## 2011-07-01 LAB — TYPE AND SCREEN
ABO/RH(D): A POS
Antibody Screen: NEGATIVE

## 2011-07-01 LAB — ABO/RH: ABO/RH(D): A POS

## 2011-09-03 ENCOUNTER — Ambulatory Visit (INDEPENDENT_AMBULATORY_CARE_PROVIDER_SITE_OTHER): Payer: Medicare Other | Admitting: Vascular Surgery

## 2011-09-03 ENCOUNTER — Other Ambulatory Visit: Payer: Self-pay

## 2011-09-03 DIAGNOSIS — R55 Syncope and collapse: Secondary | ICD-10-CM

## 2011-09-03 NOTE — Progress Notes (Signed)
Carotid duplex perfomred @ [facility] 09/03/2011

## 2011-09-11 NOTE — Procedures (Unsigned)
CAROTID DUPLEX EXAM  INDICATION:  Carotid stenosis.  HISTORY: Diabetes:  No. Cardiac:  CABG, CAD. Hypertension:  Yes. Smoking:  No. Previous Surgery:  No carotid intervention. CV History:  Syncope, otherwise asymptomatic. Amaurosis Fugax No, Paresthesias No, Hemiparesis No.                                      RIGHT             LEFT Brachial systolic pressure:         148               148 Brachial Doppler waveforms:         WNL               WNL Vertebral direction of flow:        Antegrade         Antegrade DUPLEX VELOCITIES (cm/sec) CCA peak systolic                   57                108 ECA peak systolic                   58                142 ICA peak systolic                   80                122 ICA end diastolic                   37                34 PLAQUE MORPHOLOGY:                  Calcified         Calcified PLAQUE AMOUNT:                      Mild-to-moderate  Mild-to-moderate PLAQUE LOCATION:                    CCA/ICA           CCA/ICA  IMPRESSION: 1. Bilateral internal carotid artery stenosis estimated in the 1% to     39% range. 2. Bilateral external carotid arteries appear patent. 3. Bilateral vertebral arteries are patent and antegrade.  ___________________________________________ Ronald [name]. Hart Rochester, M.D.  SH/MEDQ  D:  09/03/2011  T:  09/03/2011  Job:  [id number]

## 2011-10-21 ENCOUNTER — Other Ambulatory Visit: Payer: Self-pay

## 2011-10-21 MED ORDER — LISINOPRIL 20 MG PO TABS: 20.0000 mg | ORAL_TABLET | Freq: Every day | ORAL | Status: DC

## 2012-04-14 ENCOUNTER — Other Ambulatory Visit: Payer: Self-pay

## 2012-04-14 MED ORDER — METOPROLOL TARTRATE 50 MG PO TABS: ORAL_TABLET | ORAL | Status: DC

## 2012-04-20 ENCOUNTER — Ambulatory Visit (INDEPENDENT_AMBULATORY_CARE_PROVIDER_SITE_OTHER)
Admission: RE | Admit: 2012-04-20 | Discharge: 2012-04-20 | Disposition: A | Discharge status: Home / Self Care | Payer: Medicare Other | Source: Ambulatory Visit | Attending: Internal Medicine | Admitting: Internal Medicine

## 2012-04-20 ENCOUNTER — Ambulatory Visit: Payer: Medicare Other

## 2012-04-20 ENCOUNTER — Encounter: Payer: Self-pay | Admitting: Internal Medicine

## 2012-04-20 ENCOUNTER — Other Ambulatory Visit: Payer: Self-pay | Admitting: *Deleted

## 2012-04-20 ENCOUNTER — Ambulatory Visit (INDEPENDENT_AMBULATORY_CARE_PROVIDER_SITE_OTHER): Payer: Medicare Other | Admitting: Internal Medicine

## 2012-04-20 VITALS — BP 134/78 | HR 68 | Temp 97.9°F | Ht 70.0 in | Wt 182.0 lb

## 2012-04-20 DIAGNOSIS — Z136 Encounter for screening for cardiovascular disorders: Secondary | ICD-10-CM

## 2012-04-20 DIAGNOSIS — H9190 Unspecified hearing loss, unspecified ear: Secondary | ICD-10-CM

## 2012-04-20 DIAGNOSIS — I10 Essential (primary) hypertension: Secondary | ICD-10-CM

## 2012-04-20 DIAGNOSIS — R059 Cough, unspecified: Secondary | ICD-10-CM

## 2012-04-20 DIAGNOSIS — Z0000 Encounter for general adult medical examination without abnormal findings: Secondary | ICD-10-CM

## 2012-04-20 DIAGNOSIS — R05 Cough: Secondary | ICD-10-CM

## 2012-04-20 DIAGNOSIS — H9193 Unspecified hearing loss, bilateral: Secondary | ICD-10-CM

## 2012-04-20 DIAGNOSIS — R609 Edema, unspecified: Secondary | ICD-10-CM

## 2012-04-20 LAB — URINALYSIS, ROUTINE W REFLEX MICROSCOPIC
Bilirubin Urine: NEGATIVE
Ketones, ur: NEGATIVE
Leukocytes, UA: NEGATIVE
Nitrite: NEGATIVE
Specific Gravity, Urine: 1.025 (ref 1.000–1.030)
Urobilinogen, UA: 0.2 (ref 0.0–1.0)

## 2012-04-20 LAB — CBC WITH DIFFERENTIAL/PLATELET
Basophils Relative: 0.2 % (ref 0.0–3.0)
Eosinophils Relative: 3.6 % (ref 0.0–5.0)
HCT: 41 % (ref 39.0–52.0)
Lymphs Abs: 1.2 10*3/uL (ref 0.7–4.0)
MCV: 105 fl — ABNORMAL HIGH (ref 78.0–100.0)
Monocytes Absolute: 0.7 10*3/uL (ref 0.1–1.0)
Monocytes Relative: 15.1 % — ABNORMAL HIGH (ref 3.0–12.0)
RBC: 3.9 Mil/uL — ABNORMAL LOW (ref 4.22–5.81)
WBC: 4.9 10*3/uL (ref 4.5–10.5)

## 2012-04-20 LAB — LIPID PANEL
HDL: 65.7 mg/dL (ref >39.00)
Total CHOL/HDL Ratio: 2
VLDL: 12.4 mg/dL (ref 0.0–40.0)

## 2012-04-20 LAB — BASIC METABOLIC PANEL
BUN: 21 mg/dL (ref 6–23)
CO2: 27 mEq/L (ref 19–32)
Calcium: 9 mg/dL (ref 8.4–10.5)
GFR: 69.1 mL/min (ref >60.00)
Glucose, Bld: 113 mg/dL — ABNORMAL HIGH (ref 70–99)
Potassium: 4.8 mEq/L (ref 3.5–5.1)

## 2012-04-20 LAB — TSH: TSH: 2.27 u[IU]/mL (ref 0.35–5.50)

## 2012-04-20 LAB — HEPATIC FUNCTION PANEL
ALT: 26 U/L (ref 0–53)
Bilirubin, Direct: 0.2 mg/dL (ref 0.0–0.3)
Total Bilirubin: 1.2 mg/dL (ref 0.3–1.2)

## 2012-04-20 MED ORDER — METOPROLOL TARTRATE 50 MG PO TABS: ORAL_TABLET | ORAL | Status: DC

## 2012-04-20 MED ORDER — ROSUVASTATIN CALCIUM 40 MG PO TABS: ORAL_TABLET | ORAL | Status: DC

## 2012-04-20 MED ORDER — IRBESARTAN 150 MG PO TABS: 150.0000 mg | ORAL_TABLET | Freq: Every day | ORAL | Status: DC

## 2012-04-20 NOTE — Progress Notes (Signed)
Subjective:    Patient ID: Ronald [name], male    DOB: 12-Mar-1930, 78 y.o.   MRN: [id number]  HPI  Here for wellness and f/u;  Overall doing ok;  Pt denies CP, worsening SOB, DOE, wheezing, orthopnea, PND, worsening LE edema except for ankle edema bilat for severaal months (does not bother him but wife noticed, and asked him to bring it up, goes down with leg elevation, does tend to drink plenty of fluids daily), no palpitations, dizziness or syncope.  Pt denies neurological change such as new Headache, facial or extremity weakness.  Pt denies polydipsia, polyuria, or low sugar symptoms. Pt states overall good compliance with treatment and medications, good tolerability, and trying to follow lower cholesterol diet.  Pt denies worsening depressive symptoms, suicidal ideation or panic. No fever, wt loss, night sweats, loss of appetite, or other constitutional symptoms.  Pt states good ability with ADL's, low fall risk, home safety reviewed and adequate, no significant changes in hearing or vision, and very active with exercise for his age with near daily at the gym. Has incresaed stress recently with wife's cancer diagnosis.  Also with nonprod cough for a couple of yrs, did not bring it up before, but too annoying to dismiss now.   Has ongoing chin and hand tremors but high freqeuncy, no bradykinesia or parkinsons .  Can sit on stationary bike, but cannot do treadmill due to onset spine pain, s/p lumbar fusion.  Alcohol resolves the tremor temporarily Past Medical History  Diagnosis Date  . ALLERGIC RHINITIS 11/25/2007  . ANXIETY 11/25/2007  . COLONIC POLYPS, HX OF 11/25/2007  . CORONARY ARTERY DISEASE 11/25/2007  . Cough 01/04/2009  . HEMORRHOIDS, INTERNAL 11/06/2010  . HOARSENESS 01/04/2009  . HYPERLIPIDEMIA 11/25/2007  . HYPERTENSION 11/25/2007  . LOW BACK PAIN 11/25/2007  . PEPTIC ULCER DISEASE 11/25/2007  . Preventative health care 04/07/2011  . PSA, INCREASED 01/05/2009  . TREMOR, ESSENTIAL 11/25/2007  .  Lumbar radiculopathy, chronic     with right foot drop  . Right foot drop    Past Surgical History  Procedure Date  . S/p lumbar laminectomy and fusion   . Appendectomy   . Tonsillectomy   . Coronary artery bypass graft     reports that he has never smoked. He does not have any smokeless tobacco history on file. He reports that he drinks alcohol. He reports that he does not use illicit drugs. family history includes Cancer in his father. Allergies  Allergen Reactions  . Atorvastatin     REACTION: leg pain  . Niacin   . Simvastatin    Current Outpatient Prescriptions on File Prior to Visit  Medication Sig Dispense Refill  . aspirin 81 MG tablet Take 81 mg by mouth daily.        . Cyanocobalamin (VITAMIN B 12 PO) Take by mouth daily.        [name] lisinopril (PRINIVIL,ZESTRIL) 20 MG tablet Take 1 tablet (20 mg total) by mouth daily.  60 tablet  6  . metoprolol (LOPRESSOR) 50 MG tablet Take 1 and 1/2 tablets by mouth every morning and 1 tablet every evening.  75 tablet  0  . OMEGA 3 1200 MG CAPS Take 3 tablets by mouth once a day       . rosuvastatin (CRESTOR) 40 MG tablet 1/2 tablet by mouth once daily  30 each  1   Review of Systems Review of Systems  Constitutional: Negative for diaphoresis, activity change, appetite change and unexpected  weight change.  HENT: Negative for hearing loss, ear pain, facial swelling, mouth sores and neck stiffness.   Eyes: Negative for pain, redness and visual disturbance.  Respiratory: Negative for shortness of breath and wheezing.   Cardiovascular: Negative for chest pain and palpitations.  Gastrointestinal: Negative for diarrhea, blood in stool, abdominal distention and rectal pain.  Genitourinary: Negative for hematuria, flank pain and decreased urine volume.  Musculoskeletal: Negative for myalgias and joint swelling.  Skin: Negative for color change and wound.  Neurological: Negative for syncope and numbness.  Hematological: Negative for  adenopathy.  Psychiatric/Behavioral: Negative for hallucinations, self-injury, decreased concentration and agitation.     Objective:   Physical Exam BP 134/78  Pulse 68  Temp 97.9 F (36.6 C) (Oral)  Ht 5\' 10"  (1.778 m)  Wt 182 lb (82.555 kg)  BMI 26.11 kg/m2  SpO2 92% Physical Exam  VS noted, elderly Constitutional: Pt is oriented to person, place, and time. Appears well-developed and well-nourished.  HENT:  Head: Normocephalic and atraumatic.  Right Ear: External ear normal.  Left Ear: External ear normal.  Nose: Nose normal.  Mouth/Throat: Oropharynx is clear and moist.  Bilat tm's clear after irrigation Eyes: Conjunctivae and EOM are normal. Pupils are equal, round, and reactive to light.  Neck: Normal range of motion. Neck supple. No JVD present. No tracheal deviation present.  Cardiovascular: Normal rate, regular rhythm, normal heart sounds and intact distal pulses.   Pulmonary/Chest: Effort normal and breath sounds normal.  Abdominal: Soft. Bowel sounds are normal. There is no tenderness.  Musculoskeletal: Normal range of motion. Exhibits no edema.  Lymphadenopathy:  Has no cervical adenopathy.  Neurological: Pt is alert and oriented to person, place, and time. Pt has normal reflexes. No cranial nerve deficit. Has chin and hand tremors Skin: Skin is warm and dry. No rash noted. trace to 1+ edema to ankles bilat Psychiatric:  Has 1+ nervous. Behavior is normal.     Assessment & Plan:

## 2012-04-20 NOTE — Assessment & Plan Note (Signed)
Presumed secondary to ACE, to d/c the ACE, also for cxr

## 2012-04-20 NOTE — Assessment & Plan Note (Addendum)
Overall doing well, age appropriate education and counseling updated, referrals for preventative services and immunizations addressed, dietary and smoking counseling addressed, most recent labs and ECG reviewed.  I have personally reviewed and have noted: 1) the patient's medical and social history 2) The pt's use of alcohol, tobacco, and illicit drugs 3) The patient's current medications and supplements 4) Functional ability including ADL's, fall risk, home safety risk, hearing and visual impairment 5) Diet and physical activities 6) Evidence for depression or mood disorder 7) The patient's height, weight, and BMI have been recorded in the chart I have made referrals, and provided counseling and education based on review of the above ECG reviewed as per emr; also d/w pt  - delicnes screening PSA at his age

## 2012-04-20 NOTE — Patient Instructions (Addendum)
Please check with your insurance to see if the Shingles shot is covered; if so, please make Nurse Visit for the shot Please try to not drink too much fluids, avoid salt in the diet, keep legs elevated if sitting to reduce swelling, and consider compression stockings at [facility] [address] (knee-high) Please call if you would like to try the lasix 20 mg per day as needed only Please stop the lisinopril Please start the generic avapro (better than losartan for blood pressure, still generic and no cough) Please go to XRAY in the Basement for the x-ray test You will be contacted by phone if any changes need to be made immediately.  Otherwise, you will receive a letter about your results with an explanation. Your medications were all refilled today Your ears were irrigated today of wax to improve hearing Please return in 1 year for your yearly visit, or sooner if needed, with Lab testing done 3-5 days before

## 2012-04-20 NOTE — Assessment & Plan Note (Addendum)
Ok to change to avapro 150 qd

## 2012-04-20 NOTE — Assessment & Plan Note (Signed)
Mild, likely due to wax , improved with irrigation today

## 2012-04-20 NOTE — Assessment & Plan Note (Signed)
ECG reviewed as per emr, ? Venous insuff vs other, for low salt, dont push oral fluids, leg elevation, and compression stockings, also consider lasix 20 qd prn (hold for now) and consider echo

## 2012-04-21 ENCOUNTER — Encounter: Payer: Self-pay | Admitting: Internal Medicine

## 2012-04-21 LAB — VITAMIN B12: Vitamin B-12: 692 pg/mL (ref 211–911)

## 2012-04-21 LAB — HEMOGLOBIN A1C: Hgb A1c MFr Bld: 5.9 % (ref 4.6–6.5)

## 2012-06-03 ENCOUNTER — Telehealth: Payer: Self-pay

## 2012-06-03 MED ORDER — ROSUVASTATIN CALCIUM 40 MG PO TABS: ORAL_TABLET | ORAL | Status: DC

## 2012-06-03 NOTE — Telephone Encounter (Signed)
The patient called to inform that his most recent crestor rx was written to take 1/2 daily #45.  The pharmacy will only give him #15 and cost too much.  Normally in the past the prescription has been written to take 1 per day even though he only takes 1/2 daily.  Patient is requesting to have prescription written correctly as crestor 40 mg 1 per day #30 sent to [facility].

## 2012-06-03 NOTE — Telephone Encounter (Signed)
Ok - to robin to handle 

## 2012-06-03 NOTE — Telephone Encounter (Signed)
Sig changed and new RX sent in e-script, pharmacy to notify

## 2012-09-14 ENCOUNTER — Other Ambulatory Visit: Payer: Self-pay | Admitting: Internal Medicine

## 2012-12-07 ENCOUNTER — Other Ambulatory Visit: Payer: Self-pay | Admitting: Internal Medicine

## 2013-04-04 ENCOUNTER — Other Ambulatory Visit: Payer: Self-pay | Admitting: Internal Medicine

## 2013-04-13 ENCOUNTER — Other Ambulatory Visit: Payer: Self-pay | Admitting: Internal Medicine

## 2013-04-21 ENCOUNTER — Ambulatory Visit (INDEPENDENT_AMBULATORY_CARE_PROVIDER_SITE_OTHER): Payer: Medicare Other | Admitting: Internal Medicine

## 2013-04-21 ENCOUNTER — Encounter: Payer: Self-pay | Admitting: Internal Medicine

## 2013-04-21 ENCOUNTER — Other Ambulatory Visit (INDEPENDENT_AMBULATORY_CARE_PROVIDER_SITE_OTHER): Payer: Medicare Other

## 2013-04-21 VITALS — BP 140/84 | HR 54 | Temp 98.0°F | Ht 70.0 in | Wt 185.0 lb

## 2013-04-21 DIAGNOSIS — Z0000 Encounter for general adult medical examination without abnormal findings: Secondary | ICD-10-CM

## 2013-04-21 DIAGNOSIS — Z136 Encounter for screening for cardiovascular disorders: Secondary | ICD-10-CM

## 2013-04-21 DIAGNOSIS — I2510 Atherosclerotic heart disease of native coronary artery without angina pectoris: Secondary | ICD-10-CM

## 2013-04-21 DIAGNOSIS — R7309 Other abnormal glucose: Secondary | ICD-10-CM

## 2013-04-21 DIAGNOSIS — E785 Hyperlipidemia, unspecified: Secondary | ICD-10-CM

## 2013-04-21 DIAGNOSIS — G252 Other specified forms of tremor: Secondary | ICD-10-CM

## 2013-04-21 DIAGNOSIS — H9193 Unspecified hearing loss, bilateral: Secondary | ICD-10-CM

## 2013-04-21 DIAGNOSIS — F411 Generalized anxiety disorder: Secondary | ICD-10-CM

## 2013-04-21 DIAGNOSIS — G250 Essential tremor: Secondary | ICD-10-CM

## 2013-04-21 DIAGNOSIS — R7302 Impaired glucose tolerance (oral): Secondary | ICD-10-CM

## 2013-04-21 HISTORY — DX: Impaired glucose tolerance (oral): R73.02

## 2013-04-21 LAB — CBC WITH DIFFERENTIAL/PLATELET
Basophils Relative: 0.2 % (ref 0.0–3.0)
Eosinophils Absolute: 0.2 10*3/uL (ref 0.0–0.7)
MCHC: 33.4 g/dL (ref 30.0–36.0)
MCV: 106.7 fl — ABNORMAL HIGH (ref 78.0–100.0)
Monocytes Absolute: 0.7 10*3/uL (ref 0.1–1.0)
Neutro Abs: 2.6 10*3/uL (ref 1.4–7.7)
Neutrophils Relative %: 57.7 % (ref 43.0–77.0)
RBC: 3.74 Mil/uL — ABNORMAL LOW (ref 4.22–5.81)
RDW: 13.9 % (ref 11.5–14.6)

## 2013-04-21 LAB — URINALYSIS, ROUTINE W REFLEX MICROSCOPIC
Bilirubin Urine: NEGATIVE
Hgb urine dipstick: NEGATIVE
Total Protein, Urine: NEGATIVE
Urine Glucose: NEGATIVE

## 2013-04-21 LAB — HEPATIC FUNCTION PANEL
ALT: 29 U/L (ref 0–53)
Alkaline Phosphatase: 48 U/L (ref 39–117)
Bilirubin, Direct: 0.2 mg/dL (ref 0.0–0.3)
Total Bilirubin: 1.5 mg/dL — ABNORMAL HIGH (ref 0.3–1.2)

## 2013-04-21 LAB — LIPID PANEL
LDL Cholesterol: 63 mg/dL (ref 0–99)
Total CHOL/HDL Ratio: 2
Triglycerides: 47 mg/dL (ref 0.0–149.0)

## 2013-04-21 LAB — BASIC METABOLIC PANEL
Chloride: 108 mEq/L (ref 96–112)
Creatinine, Ser: 1 mg/dL (ref 0.4–1.5)
Potassium: 4.6 mEq/L (ref 3.5–5.1)
Sodium: 140 mEq/L (ref 135–145)

## 2013-04-21 MED ORDER — IRBESARTAN 150 MG PO TABS: ORAL_TABLET | ORAL | Status: DC

## 2013-04-21 MED ORDER — ROSUVASTATIN CALCIUM 40 MG PO TABS: ORAL_TABLET | ORAL | Status: DC

## 2013-04-21 MED ORDER — METOPROLOL TARTRATE 50 MG PO TABS: ORAL_TABLET | ORAL | Status: DC

## 2013-04-21 NOTE — Assessment & Plan Note (Signed)
Improved with irrigation today

## 2013-04-21 NOTE — Assessment & Plan Note (Signed)
Previous cardiologist retired (dr Juanda Chance); will ask pt to re-establish with new [place] cards

## 2013-04-21 NOTE — Assessment & Plan Note (Signed)
Appears mild worse jaw and RUE, does not bother him as he rarely has to write and can use left hand to eat and drink; cont beta blocker, declines neurology referral

## 2013-04-21 NOTE — Assessment & Plan Note (Signed)
For f/u a1c today 

## 2013-04-21 NOTE — Patient Instructions (Addendum)
You ear wax impactions were irrigated/removed today Please continue all other medications as before, and refills have been done if requested. Please have the pharmacy call with any other refills you may need. Please continue your efforts at being more active, low cholesterol diet, and weight control. You are otherwise up to date with prevention measures today. Please call the insurance company to see if the shingles shot is covered, then please call here to arrange getting the shot. Please go to the LAB in the Basement (turn left off the elevator) for the tests to be done today You will be contacted by phone if any changes need to be made immediately.  Otherwise, you will receive a letter about your results with an explanation, but please check with MyChart first.  You will be contacted regarding the referral for: cardiology  Please remember to sign up for My Chart if you have not done so, as this will be important to you in the future with finding out test results, communicating by private email, and scheduling acute appointments online when needed.  Please return in 1 year for your yearly visit, or sooner if needed, with Lab testing done 3-5 days before

## 2013-04-21 NOTE — Progress Notes (Signed)
Subjective:    Patient ID: Ronald [name], male    DOB: [birth year], 79 y.o.   MRN: [id number]  HPI  Here for wellness and f/u;  Overall doing ok;  Pt denies CP, worsening SOB, DOE, wheezing, orthopnea, PND, worsening LE edema, palpitations, dizziness or syncope.  Pt denies neurological change such as new headache, facial or extremity weakness.  Pt denies polydipsia, polyuria, or low sugar symptoms. Pt states overall good compliance with treatment and medications, good tolerability, and has been trying to follow lower cholesterol diet.  Pt denies worsening depressive symptoms, suicidal ideation or panic. No fever, night sweats, wt loss, loss of appetite, or other constitutional symptoms.  Pt states good ability with ADL's, has low fall risk, home safety reviewed and adequate, no other significant changes in hearing or vision, and only occasionally active with exercise. Pt continues to have recurring LBP without change in severity, bowel or bladder change, fever, wt loss,  worsening LE pain/numbness/weakness, gait change or falls. Going tot he gym and working out relatively hard every day per pt though has chornic pain to the legs related to the back. S/p lumbar laminectomy, with right foot drop bilat with peroneal neuropathies.  Cant run due to this, but does elliptical and stationary bike. Past Medical History  Diagnosis Date  . ALLERGIC RHINITIS 11/25/2007  . ANXIETY 11/25/2007  . COLONIC POLYPS, HX OF 11/25/2007  . CORONARY ARTERY DISEASE 11/25/2007  . Cough 01/04/2009  . HEMORRHOIDS, INTERNAL 11/06/2010  . HOARSENESS 01/04/2009  . HYPERLIPIDEMIA 11/25/2007  . HYPERTENSION 11/25/2007  . LOW BACK PAIN 11/25/2007  . PEPTIC ULCER DISEASE 11/25/2007  . Preventative health care 04/07/2011  . PSA, INCREASED 01/05/2009  . TREMOR, ESSENTIAL 11/25/2007  . Lumbar radiculopathy, chronic     with right foot drop  . Right foot drop    Past Surgical History  Procedure Laterality Date  . S/p lumbar laminectomy and  fusion    . Appendectomy    . Tonsillectomy    . Coronary artery bypass graft      reports that he has never smoked. He does not have any smokeless tobacco history on file. He reports that  drinks alcohol. He reports that he does not use illicit drugs. family history includes Cancer in his father. Allergies  Allergen Reactions  . Atorvastatin     REACTION: leg pain  . Niacin   . Simvastatin    Current Outpatient Prescriptions on File Prior to Visit  Medication Sig Dispense Refill  . aspirin 81 MG tablet Take 81 mg by mouth daily.        . Coenzyme Q10 (COQ10 PO) Take by mouth daily.      . Cyanocobalamin (VITAMIN B 12 PO) Take by mouth daily.        . irbesartan (AVAPRO) 150 MG tablet TAKE ONE TABLET BY MOUTH DAILY  90 tablet  0  . irbesartan (AVAPRO) 150 MG tablet TAKE ONE TABLET BY MOUTH DAILY  90 tablet  3  . metoprolol (LOPRESSOR) 50 MG tablet TAKE 1 AND 1/2 TABLETS BY MOUTH EVERY MORNING AND 1 TABLET EVERYEVENING  75 tablet  5  . OMEGA 3 1200 MG CAPS Take 3 tablets by mouth once a day       . rosuvastatin (CRESTOR) 40 MG tablet 1 tablet by mouth once daily  30 tablet  11   No current facility-administered medications on file prior to visit.   Review of Systems Constitutional: Negative for diaphoresis, activity change, appetite change  or unexpected weight change.  HENT: Negative for hearing loss, ear pain, facial swelling, mouth sores and neck stiffness.   Eyes: Negative for pain, redness and visual disturbance.  Respiratory: Negative for shortness of breath and wheezing.   Cardiovascular: Negative for chest pain and palpitations.  Gastrointestinal: Negative for diarrhea, blood in stool, abdominal distention or other pain Genitourinary: Negative for hematuria, flank pain or change in urine volume.  Musculoskeletal: Negative for myalgias and joint swelling.  Skin: Negative for color change and wound.  Neurological: Negative for syncope and numbness. other than  noted Hematological: Negative for adenopathy.  Psychiatric/Behavioral: Negative for hallucinations, self-injury, decreased concentration and agitation.      Objective:   Physical Exam BP 140/84  Pulse 54  Temp(Src) 98 F (36.7 C) (Oral)  Ht 5\' 10"  (1.778 m)  Wt 185 lb (83.915 kg)  BMI 26.54 kg/m2  SpO2 96% VS noted,  Constitutional: Pt is oriented to person, place, and time. Appears well-developed and well-nourished.  Head: Normocephalic and atraumatic.  Right Ear: External ear normal.  Left Ear: External ear normal.  Nose: Nose normal.  Mouth/Throat: Oropharynx is clear and moist.  Eyes: Conjunctivae and EOM are normal. Pupils are equal, round, and reactive to light.  Neck: Normal range of motion. Neck supple. No JVD present. No tracheal deviation present.  Ear wax impactions removed bilat Cardiovascular: Normal rate, regular rhythm, normal heart sounds and intact distal pulses.   Pulmonary/Chest: Effort normal and breath sounds normal.  Abdominal: Soft. Bowel sounds are normal. There is no tenderness. No HSM  Musculoskeletal: Normal range of motion. Exhibits no edema.  Lymphadenopathy:  Has no cervical adenopathy.  Neurological: Pt is alert and oriented to person, place, and time. Pt has normal reflexes. No cranial nerve deficit. Tremor noted to jaw and RUE, motor 5/5  Skin: Skin is warm and dry. No rash noted.  Psychiatric:  Has  normal mood and affect. Behavior is normal.     Assessment & Plan:

## 2013-04-21 NOTE — Assessment & Plan Note (Signed)

## 2013-05-18 ENCOUNTER — Telehealth: Payer: Self-pay

## 2013-05-18 MED ORDER — ROSUVASTATIN CALCIUM 40 MG PO TABS: ORAL_TABLET | ORAL | Status: DC

## 2013-05-18 NOTE — Telephone Encounter (Signed)
[facility] needs clarification on Crestor 40 mg rx.  Stated the patient  States his crestor 40 mg should state he takes 1/2 po qd.  Please advise new pharmacy is [facility].

## 2013-05-18 NOTE — Telephone Encounter (Signed)
Called the patient to confirm he has been taking Crestor 40 mg 1/2 per day as prescribed by Dr. Jonny Ruiz September 2013.  Will correct medication in chart and send refill to [facility] [place] as requested by the patient.

## 2013-05-18 NOTE — Telephone Encounter (Signed)
Last lipids were fine  Robin to contact pt and verify what he is acutally taking, let the pharmacy know, and make sure med list is correct

## 2013-05-31 ENCOUNTER — Encounter: Payer: Self-pay | Admitting: Internal Medicine

## 2013-06-09 ENCOUNTER — Encounter: Payer: Self-pay | Admitting: *Deleted

## 2013-06-14 ENCOUNTER — Encounter: Payer: Self-pay | Admitting: Cardiovascular Disease

## 2013-06-14 ENCOUNTER — Ambulatory Visit (INDEPENDENT_AMBULATORY_CARE_PROVIDER_SITE_OTHER): Payer: Medicare Other | Admitting: Cardiovascular Disease

## 2013-06-14 VITALS — BP 162/90 | HR 69 | Ht 70.0 in | Wt 181.0 lb

## 2013-06-14 DIAGNOSIS — R609 Edema, unspecified: Secondary | ICD-10-CM

## 2013-06-14 DIAGNOSIS — R259 Unspecified abnormal involuntary movements: Secondary | ICD-10-CM

## 2013-06-14 DIAGNOSIS — G250 Essential tremor: Secondary | ICD-10-CM

## 2013-06-14 DIAGNOSIS — I10 Essential (primary) hypertension: Secondary | ICD-10-CM

## 2013-06-14 DIAGNOSIS — I2510 Atherosclerotic heart disease of native coronary artery without angina pectoris: Secondary | ICD-10-CM

## 2013-06-14 DIAGNOSIS — R251 Tremor, unspecified: Secondary | ICD-10-CM

## 2013-06-14 DIAGNOSIS — E785 Hyperlipidemia, unspecified: Secondary | ICD-10-CM

## 2013-06-14 MED ORDER — HYDROCHLOROTHIAZIDE 25 MG PO TABS: 12.5000 mg | ORAL_TABLET | ORAL | Status: DC | PRN

## 2013-06-14 NOTE — Assessment & Plan Note (Signed)
PRN HCTZ 12.5mg  called in Discussed low sodium diet

## 2013-06-14 NOTE — Progress Notes (Signed)
Patient ID: Ronald [name], male   DOB: 07/15/1930, 79 y.o.   MRN: [id number] The patient is 79 years old and return for management of CAD.   Previously seen by Dr Juanda Chance  Has not been seen in over 3 years Primary is Dr Jonny Ruiz  He had bypass surgery in 1998. His last catheterization was in 2003 at which time he had total occlusion of the vein graft to the right coronary and good LV function. Get a negative Myoview in 2008.  He's been doing quite well has had no recent chest pain shortness of breath or palpitations. 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 His wife has been diagnosed with lung cancer. She is undergoing chemotherapy. Stable for last few years but lots of side effects from Rx  Has RLE edema that is dependant.  Has increasing RUE and mandibular tremor  No other signs of parkinsons but no familial history of tremor  ROS: Denies fever, malais, weight loss, blurry vision, decreased visual acuity, cough, sputum, SOB, hemoptysis, pleuritic pain, palpitaitons, heartburn, abdominal pain, melena, lower extremity edema, claudication, or rash.  All other systems reviewed and negative   General: Affect appropriate Healthy:  appears stated age HEENT: normal Neck supple with no adenopathy JVP normal no bruits no thyromegaly Lungs clear with no wheezing and good diaphragmatic motion Heart:  S1/S2 no murmur,rub, gallop or click PMI normal Abdomen: benighn, BS positve, no tenderness, no AAA no bruit.  No HSM or HJR Distal pulses intact with no bruits No edema Neuro non-focal  RUE and chin tremor Skin warm and dry No muscular weakness  Medications Current Outpatient Prescriptions  Medication Sig Dispense Refill  . aspirin 81 MG tablet Take 81 mg by mouth daily.        . Coenzyme Q10 (COQ10 PO) Take by mouth daily.      . Cyanocobalamin (VITAMIN  B 12 PO) Take by mouth daily.        . irbesartan (AVAPRO) 150 MG tablet TAKE ONE TABLET BY MOUTH DAILY  90 tablet  3  . metoprolol (LOPRESSOR) 50 MG tablet TAKE 1 AND 1/2 TABLETS BY MOUTH EVERY MORNING AND 1 TABLET EVERYEVENING  75 tablet  11  . OMEGA 3 1200 MG CAPS Take 3 tablets by mouth once a day       . rosuvastatin (CRESTOR) 40 MG tablet Take 1/2 by mouth daily  45 tablet  3   No current facility-administered medications for this visit.    Allergies Atorvastatin; Niacin; and Simvastatin  Family History: Family History  Problem Relation Age of Onset  . Cancer Father     colon    Social History: History   Social History  . Marital Status: Married    Spouse Name: N/A    Number of Children: N/A  . Years of Education: N/A   Occupational History  . Not on file.   Social History Main Topics  . Smoking status: Never Smoker   . Smokeless tobacco: Not on file  . Alcohol Use: Yes  . Drug Use: No  . Sexual Activity: Not on file   Other Topics Concern  . Not on file   Social History Narrative   Son is internist in [place]    Electrocardiogram:  SR rate 52  RBBB LAFB   Assessment and Plan

## 2013-06-14 NOTE — Assessment & Plan Note (Signed)
Stable with no angina and good activity level.  Continue medical Rx  

## 2013-06-14 NOTE — Patient Instructions (Signed)
Your physician wants you to follow-up in:   YEAR WITH DR Haywood Filler will receive a reminder letter in the mail two months in advance. If you don't receive a letter, please call our office to schedule the follow-up appointment. Your physician has recommended you make the following change in your medication:  MAY TAKE HCTZ 12.5 MG  AS NEEDED  FOR  SWELLING You have been referred to DR  [facility]   DX  TREMORS

## 2013-06-14 NOTE — Assessment & Plan Note (Signed)
Well controlled.  Continue current medications and low sodium Dash type diet.   White coat component Runs normal at home

## 2013-06-14 NOTE — Assessment & Plan Note (Signed)
More bothersome to patient On beta blocker already  Refer to Dr Arbutus Leas [facility]

## 2013-06-14 NOTE — Assessment & Plan Note (Signed)
Cholesterol is at goal.  Continue current dose of statin and diet Rx.  No myalgias or side effects.  F/U  LFT's in 6 months. Lab Results  Component Value Date   LDLCALC 63 04/21/2013

## 2013-06-18 ENCOUNTER — Ambulatory Visit (INDEPENDENT_AMBULATORY_CARE_PROVIDER_SITE_OTHER): Payer: Medicare Other | Admitting: Neurology

## 2013-06-18 ENCOUNTER — Encounter: Payer: Self-pay | Admitting: Neurology

## 2013-06-18 VITALS — BP 146/70 | HR 60 | Temp 98.0°F | Resp 18 | Wt 178.6 lb

## 2013-06-18 DIAGNOSIS — G250 Essential tremor: Secondary | ICD-10-CM

## 2013-06-18 MED ORDER — PRIMIDONE 50 MG PO TABS: 50.0000 mg | ORAL_TABLET | Freq: Every day | ORAL | Status: DC

## 2013-06-18 NOTE — Progress Notes (Signed)
Subjective:   Ronald [name] was seen in consultation in the movement disorder clinic at the request of Dr. Eden Emms.  His PCP is Oliver Barre, MD.  The evaluation is for tremor.  The patient is a 79 y.o. right handed male with a history of tremor.  The tremor is primarily in the R and in the jaw.  The jaw does not bother him.  There is no family hx of tremor.    It is worse in the AM and worse with use.    Affected by caffeine:  Doesn't drink caffeine Affected by alcohol:  Yes, improves Affected by stress:  yes Affected by fatigue:  yes Spills soup if on spoon:  yes Spills spoonful of peas:  yes Spills glass of liquid if full:  yes if doesn't use both hands Affects ADL's (tying shoes, brushing teeth, etc): minimal  He does have a hx of foot drop x 3 years.  He had lumbar laminectomy several years ago after developing b/l foot drop and the laminectomy greatly helped the pain but the foot drop has been static.  The R is worse than the L.  Current/Previously tried tremor medications: n/a but on metoprolol for other reasons.  Current medications that may exacerbate tremor:  n/a  Outside reports reviewed: historical medical records.  Allergies  Allergen Reactions  . Atorvastatin     REACTION: leg pain  . Niacin   . Simvastatin     Current Outpatient Prescriptions on File Prior to Visit  Medication Sig Dispense Refill  . aspirin 81 MG tablet Take 81 mg by mouth daily.        . Coenzyme Q10 (COQ10 PO) Take by mouth daily.      . Cyanocobalamin (VITAMIN B 12 PO) Take by mouth daily.        . hydrochlorothiazide (HYDRODIURIL) 25 MG tablet Take 0.5 tablets (12.5 mg total) by mouth as needed.  30 tablet  6  . irbesartan (AVAPRO) 150 MG tablet TAKE ONE TABLET BY MOUTH DAILY  90 tablet  3  . metoprolol (LOPRESSOR) 50 MG tablet TAKE 1 AND 1/2 TABLETS BY MOUTH EVERY MORNING AND 1 TABLET EVERYEVENING  75 tablet  11  . OMEGA 3 1200 MG CAPS Take 3 tablets by mouth once a day       . rosuvastatin  (CRESTOR) 40 MG tablet Take 1/2 by mouth daily  45 tablet  3   No current facility-administered medications on file prior to visit.    Past Medical History  Diagnosis Date  . ALLERGIC RHINITIS 11/25/2007  . ANXIETY 11/25/2007  . COLONIC POLYPS, HX OF 11/25/2007  . CORONARY ARTERY DISEASE 11/25/2007  . Cough 01/04/2009  . HEMORRHOIDS, INTERNAL 11/06/2010  . HOARSENESS 01/04/2009  . HYPERLIPIDEMIA 11/25/2007  . HYPERTENSION 11/25/2007  . LOW BACK PAIN 11/25/2007  . PEPTIC ULCER DISEASE 11/25/2007  . Preventative health care 04/07/2011  . PSA, INCREASED 01/05/2009  . TREMOR, ESSENTIAL 11/25/2007  . Lumbar radiculopathy, chronic     with right foot drop  . Right foot drop   . Impaired glucose tolerance 04/21/2013    Past Surgical History  Procedure Laterality Date  . S/p lumbar laminectomy and fusion    . Appendectomy    . Tonsillectomy    . Coronary artery bypass graft      History   Social History  . Marital Status: Married    Spouse Name: N/A    Number of Children: N/A  . Years of Education: N/A  Occupational History  . Not on file.   Social History Main Topics  . Smoking status: Never Smoker   . Smokeless tobacco: Not on file  . Alcohol Use: Yes     Comment: Occ  . Drug Use: No  . Sexual Activity: Not on file   Other Topics Concern  . Not on file   Social History Narrative   Son is internist in [place]    Family Status  Relation Status Death Age  . Father Deceased 31    Colon Cancer  . Mother Deceased 57    Old Age  . Son Alive     3    Review of Systems A complete 10 system ROS was obtained and was negative apart from what is mentioned.   Objective:   VITALS:   Filed Vitals:   06/18/13 1306  BP: 146/70  Pulse: 60  Temp: 98 F (36.7 C)  Resp: 18  Weight: 178 lb 9.6 oz (81.012 kg)   Gen:  Appears stated age and in NAD. HEENT:  Normocephalic, atraumatic. The mucous membranes are moist. The superficial temporal arteries are without ropiness or  tenderness. Cardiovascular: Regular rate and rhythm. Lungs: Clear to auscultation bilaterally. Neck: There are no carotid bruits noted bilaterally.  NEUROLOGICAL:  Orientation:  The patient is alert and oriented x 3.  Recent and remote memory are intact.  Attention span and concentration are normal.  Able to name objects and repeat without trouble.  Fund of knowledge is appropriate Cranial nerves: There is good facial symmetry. The pupils are equal round and reactive to light bilaterally. Fundoscopic exam reveals clear disc margins bilaterally. Extraocular muscles are intact and visual fields are full to confrontational testing. Speech is fluent and clear. Soft palate rises symmetrically and there is no tongue deviation. Hearing is intact to conversational tone. Tone: Tone is good throughout. Sensation: Sensation is intact to light touch and pinprick throughout (facial, trunk, extremities). Vibration is absent in the LE. There is no extinction with double simultaneous stimulation. There is no sensory dermatomal level identified. Coordination:  The patient has no dysdiadichokinesia or dysmetria. Motor: Strength is 5/5 in the bilateral upper and lower extremities with the exception of 0/5 ankle dorsiflexion b/l.  There is 0/5 strength in b/l EDB.  There is 3/5 ankle eversion on the L and 0/5 on the R.  Shoulder shrug is equal bilaterally.  There is no pronator drift.  There are no fasciculations noted. DTR's: Deep tendon reflexes are 0/4 at the bilateral biceps, triceps, brachioradialis, patella and achilles.  Plantar responses are downgoing bilaterally. Gait and Station: The patient has a "marching gait" on the R due to foot drop.  MOVEMENT EXAM: Tremor:  There is  tremor in the UE, noted most significantly with action.  It is mod-severe on the R and mild on the L.   A definite postural tremor is noted. There is no tremor at rest.  The patient is unable able to pour water from one glass to another  without spilling it because of R hand tremor.  There is a jaw tremor and rare tremor of the head in the "yes" direction     Assessment/Plan:   1.  Essential Tremor.  -This is somewhat asymmetric, but that is not unheard of.  He has no features of any other disease.  We talked about areas treatments.  He is definitely not interested in DBS, and I don't disagree with him.  Ultimately, we decided to try primidone.  Risks, benefits, side effects and alternative therapies were discussed.  The opportunity to ask questions was given and they were answered to the best of my ability.  The patient expressed understanding and willingness to follow the outlined treatment protocols.  -He is to call me in 4 weeks and let me know how he is doing.  I suspect that he is going to need more of the medication with low-dose.  -Patient education was provided.  -Followup with me will be in the next 3 months.

## 2013-06-18 NOTE — Patient Instructions (Addendum)
1.  Call me in 4 weeks and let me know how you are doing 2.  Make a follow up for January

## 2013-06-29 ENCOUNTER — Ambulatory Visit: Payer: Medicare Other | Admitting: Neurology

## 2013-07-15 ENCOUNTER — Telehealth: Payer: Self-pay | Admitting: Neurology

## 2013-07-15 NOTE — Telephone Encounter (Signed)
Pt calling with an update ,as instructed, on how he is doing. Please call / Sherri

## 2013-07-15 NOTE — Telephone Encounter (Signed)
Called pt and relayed your message. He wants to know if we can send in a rx for 60tabs instead of the 30 since he will now be taking 2 a day?

## 2013-07-15 NOTE — Telephone Encounter (Signed)
Is he only taking 50 mg at night?  Have him increase to bid.

## 2013-07-15 NOTE — Telephone Encounter (Signed)
Called pt back and he said the first week of taking Primidone his symptoms were 80% better, the second week he felt about 50% better and now he feels around 40% better than before starting the medicine. He is having a lot of tremors again which had stopped the first week but now have returned. He wants to know Dr.Tat's advice.

## 2013-07-16 ENCOUNTER — Other Ambulatory Visit: Payer: Self-pay

## 2013-07-16 MED ORDER — PRIMIDONE 50 MG PO TABS: 50.0000 mg | ORAL_TABLET | Freq: Every day | ORAL | Status: DC

## 2013-07-16 MED ORDER — PRIMIDONE 50 MG PO TABS: 50.0000 mg | ORAL_TABLET | Freq: Two times a day (BID) | ORAL | Status: DC

## 2013-07-16 NOTE — Telephone Encounter (Signed)
Yes, please refill with 5 additional refills.

## 2013-07-16 NOTE — Telephone Encounter (Signed)
I sent rx to pharmacy per Dr.Tat's instructions.

## 2013-08-16 ENCOUNTER — Other Ambulatory Visit: Payer: Self-pay

## 2013-08-16 ENCOUNTER — Telehealth: Payer: Self-pay

## 2013-08-16 MED ORDER — PRIMIDONE 50 MG PO TABS: ORAL_TABLET | ORAL | Status: DC

## 2013-08-16 NOTE — Telephone Encounter (Signed)
Pt called to inform Dr.Tat that he has not seen any improvement with the increase in Primidone. He wants to know if she would suggest anything else to try before he comes in January for his follow up?

## 2013-08-16 NOTE — Telephone Encounter (Signed)
Hes still at a fairly small dose.  If no SE, increase to 100 mg in the AM and continue the 50 at night.  May send new RX if he needs it.

## 2013-08-16 NOTE — Telephone Encounter (Signed)
I called pt and he agreed to increase dosage of Primidone as you instructed. He said he is not having any side effects so far. I sent a new rx to the pharmacy.

## 2013-08-17 ENCOUNTER — Encounter: Payer: Self-pay | Admitting: Internal Medicine

## 2013-08-17 ENCOUNTER — Ambulatory Visit (INDEPENDENT_AMBULATORY_CARE_PROVIDER_SITE_OTHER): Payer: Medicare Other | Admitting: Internal Medicine

## 2013-08-17 VITALS — BP 142/70 | HR 75 | Temp 97.3°F | Ht 70.0 in | Wt 183.0 lb

## 2013-08-17 DIAGNOSIS — I10 Essential (primary) hypertension: Secondary | ICD-10-CM

## 2013-08-17 DIAGNOSIS — R7302 Impaired glucose tolerance (oral): Secondary | ICD-10-CM

## 2013-08-17 DIAGNOSIS — B029 Zoster without complications: Secondary | ICD-10-CM

## 2013-08-17 DIAGNOSIS — R7309 Other abnormal glucose: Secondary | ICD-10-CM

## 2013-08-17 MED ORDER — VALACYCLOVIR HCL 1 G PO TABS: 1000.0000 mg | ORAL_TABLET | Freq: Three times a day (TID) | ORAL | Status: DC

## 2013-08-17 NOTE — Assessment & Plan Note (Signed)
Mild to mod, for antibx course - valtrex,  to f/u any worsening symptoms or concerns 

## 2013-08-17 NOTE — Patient Instructions (Addendum)
Please take all new medication as prescribed Please continue all other medications as before, and refills have been done if requested. Please have the pharmacy call with any other refills you may need.  Please remember to sign up for My Chart if you have not done so, as this will be important to you in the future with finding out test results, communicating by private email, and scheduling acute appointments online when needed.  Please return in 9 months, or sooner if needed, with Lab testing done 3-5 days before

## 2013-08-17 NOTE — Assessment & Plan Note (Signed)
stable overall by history and exam, recent data reviewed with pt, and pt to continue medical treatment as before,  to f/u any worsening symptoms or concerns BP Readings from Last 3 Encounters:  08/17/13 142/70  06/18/13 146/70  06/14/13 162/90

## 2013-08-17 NOTE — Progress Notes (Signed)
Subjective:    Patient ID: Ronald [name], male    DOB: 1930/05/09, 79 y.o.   MRN: [id number]  HPI  Here with onset rash x 10 days, noticed after comging home from the gym initially, had a shingles shot last Friday incidently, but rash worse with several blisters. Pt denies chest pain, increased sob or doe, wheezing, orthopnea, PND, increased LE swelling, palpitations, dizziness or syncope.  Still working with neurology for tremors.  Trying to be diligent about lower chol diet Past Medical History  Diagnosis Date  . ALLERGIC RHINITIS 11/25/2007  . ANXIETY 11/25/2007  . COLONIC POLYPS, HX OF 11/25/2007  . CORONARY ARTERY DISEASE 11/25/2007  . Cough 01/04/2009  . HEMORRHOIDS, INTERNAL 11/06/2010  . HOARSENESS 01/04/2009  . HYPERLIPIDEMIA 11/25/2007  . HYPERTENSION 11/25/2007  . LOW BACK PAIN 11/25/2007  . PEPTIC ULCER DISEASE 11/25/2007  . Preventative health care 04/07/2011  . PSA, INCREASED 01/05/2009  . TREMOR, ESSENTIAL 11/25/2007  . Lumbar radiculopathy, chronic     with right foot drop  . Right foot drop   . Impaired glucose tolerance 04/21/2013   Past Surgical History  Procedure Laterality Date  . S/p lumbar laminectomy and fusion    . Appendectomy    . Tonsillectomy    . Coronary artery bypass graft      reports that he has never smoked. He does not have any smokeless tobacco history on file. He reports that he drinks alcohol. He reports that he does not use illicit drugs. family history includes Cancer in his father. Allergies  Allergen Reactions  . Atorvastatin     REACTION: leg pain  . Niacin   . Simvastatin    Current Outpatient Prescriptions on File Prior to Visit  Medication Sig Dispense Refill  . aspirin 81 MG tablet Take 81 mg by mouth daily.        . Coenzyme Q10 (COQ10 PO) Take by mouth daily.      . Cyanocobalamin (VITAMIN B 12 PO) Take by mouth daily.        . hydrochlorothiazide (HYDRODIURIL) 25 MG tablet Take 0.5 tablets (12.5 mg total) by mouth as needed.  30  tablet  6  . irbesartan (AVAPRO) 150 MG tablet TAKE ONE TABLET BY MOUTH DAILY  90 tablet  3  . metoprolol (LOPRESSOR) 50 MG tablet TAKE 1 AND 1/2 TABLETS BY MOUTH EVERY MORNING AND 1 TABLET EVERYEVENING  75 tablet  11  . OMEGA 3 1200 MG CAPS Take 3 tablets by mouth once a day       . primidone (MYSOLINE) 50 MG tablet Take 100mg  in the morning and 50mg  at night.  90 tablet  5  . rosuvastatin (CRESTOR) 40 MG tablet Take 1/2 by mouth daily  45 tablet  3   No current facility-administered medications on file prior to visit.   Review of Systems  Constitutional: Negative for unexpected weight change, or unusual diaphoresis  HENT: Negative for tinnitus.   Eyes: Negative for photophobia and visual disturbance.  Respiratory: Negative for choking and stridor.   Gastrointestinal: Negative for vomiting and blood in stool.  Genitourinary: Negative for hematuria and decreased urine volume.  Musculoskeletal: Negative for acute joint swelling Skin: Negative for color change and wound.  Neurological: Negative for tremors and numbness other than noted  Psychiatric/Behavioral: Negative for decreased concentration or  hyperactivity.       Objective:   Physical Exam BP 142/70  Pulse 75  Temp(Src) 97.3 F (36.3 C) (Oral)  Ht  5\' 10"  (1.778 m)  Wt 183 lb (83.008 kg)  BMI 26.26 kg/m2  SpO2 95% VS noted,  Constitutional: Pt appears well-developed and well-nourished.  HENT: Head: NCAT.  Right Ear: External ear normal.  Left Ear: External ear normal.  Eyes: Conjunctivae and EOM are normal. Pupils are equal, round, and reactive to light.  Neck: Normal range of motion. Neck supple.  Cardiovascular: Normal rate and regular rhythm.   Pulmonary/Chest: Effort normal and breath sounds normal.  Abd:  Soft, NT, non-distended, + BS Neurological: Pt is alert. Not confused  Skin: has typical grouped vesicles on erythem base at beginning of natal cleft, nontender with some lfet sided satellite lesion as  well Psychiatric: Pt behavior is normal. Thought content normal.       Assessment & Plan:

## 2013-08-17 NOTE — Assessment & Plan Note (Signed)
stable overall by history and exam, recent data reviewed with pt, and pt to continue medical treatment as before,  to f/u any worsening symptoms or concerns Lab Results  Component Value Date   HGBA1C 5.9 04/21/2013

## 2013-08-17 NOTE — Progress Notes (Signed)
Pre-visit discussion using our clinic review tool. No additional management support is needed unless otherwise documented below in the visit note.  

## 2013-09-21 ENCOUNTER — Encounter: Payer: Self-pay | Admitting: Neurology

## 2013-09-21 ENCOUNTER — Ambulatory Visit (INDEPENDENT_AMBULATORY_CARE_PROVIDER_SITE_OTHER): Payer: Medicare Other | Admitting: Neurology

## 2013-09-21 VITALS — BP 142/70 | HR 68 | Temp 97.6°F | Resp 14 | Ht 70.0 in | Wt 186.7 lb

## 2013-09-21 DIAGNOSIS — G252 Other specified forms of tremor: Principal | ICD-10-CM

## 2013-09-21 DIAGNOSIS — G250 Essential tremor: Secondary | ICD-10-CM

## 2013-09-21 DIAGNOSIS — J387 Other diseases of larynx: Secondary | ICD-10-CM

## 2013-09-21 DIAGNOSIS — J383 Other diseases of vocal cords: Secondary | ICD-10-CM

## 2013-09-21 NOTE — Progress Notes (Signed)
Subjective:   Ronald [name] was seen in consultation in the movement disorder clinic at the request of Dr. Eden EmmsNishan.  His PCP is Oliver BarreJames Jayland, MD.  The evaluation is for tremor.  The patient is a 80 y.o. right handed male with a history of tremor.  The tremor is primarily in the R and in the jaw.  The jaw does not bother him.  There is no family hx of tremor.    It is worse in the AM and worse with use.    Affected by caffeine:  Doesn't drink caffeine Affected by alcohol:  Yes, improves Affected by stress:  yes Affected by fatigue:  yes Spills soup if on spoon:  yes Spills spoonful of peas:  yes Spills glass of liquid if full:  yes if doesn't use both hands Affects ADL's (tying shoes, brushing teeth, etc): minimal  He does have a hx of foot drop x 3 years.  He had lumbar laminectomy several years ago after developing b/l foot drop and the laminectomy greatly helped the pain but the foot drop has been static.  The R is worse than the L.  09/21/13 update:  The pt is f/u today regarding ET.  I started him on primidone last visit, in early October.  He initially did well but seemed to lose efficacy and by 10/30, he had contacted me and we increased it to 50 mg bid.  Again, on 08/16/13, it was increased to 100 mg in the AM and 50 mg in the PM.  Again, he did well for a few days but then seemed to lose efficacy.  He states it is not as bad as it was before the medication but not as good as when he initially started it.  He also now has a feeling like a "tiredness" comes over his eyes, as if he needs to close them.  He does not describe eyelid opening apraxia.  He has noted changes in the voice as well.  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  Occasionally, he will have momentary double vision, but states that he can "blink it away."  He did see the ophthalmologist since last visit and the examination was unremarkable, and  unchanged from 3 years ago.    Current/Previously tried tremor medications: n/a but on metoprolol for other reasons.  Current medications that may exacerbate tremor:  n/a  Outside reports reviewed: historical medical records.  Allergies  Allergen Reactions  . Atorvastatin     REACTION: leg pain  . Niacin   . Simvastatin     Current Outpatient Prescriptions on File Prior to Visit  Medication Sig Dispense Refill  . aspirin 81 MG tablet Take 81 mg by mouth daily.        . Coenzyme Q10 (COQ10 PO) Take by mouth daily.      . Cyanocobalamin (VITAMIN B 12 PO) Take by mouth daily.        . hydrochlorothiazide (HYDRODIURIL) 25 MG tablet Take 0.5 tablets (12.5 mg total) by mouth as needed.  30 tablet  6  . irbesartan (AVAPRO) 150 MG tablet TAKE ONE TABLET BY MOUTH DAILY  90 tablet  3  . metoprolol (LOPRESSOR) 50 MG tablet TAKE 1 AND 1/2 TABLETS BY MOUTH EVERY MORNING AND 1 TABLET EVERYEVENING  75 tablet  11  . OMEGA 3 1200 MG CAPS Take 3 tablets by mouth once a day       .  primidone (MYSOLINE) 50 MG tablet Take 100mg  in the morning and 50mg  at night.  90 tablet  5  . rosuvastatin (CRESTOR) 40 MG tablet Take 1/2 by mouth daily  45 tablet  3  . valACYclovir (VALTREX) 1000 MG tablet Take 1 tablet (1,000 mg total) by mouth 3 (three) times daily.  21 tablet  0   No current facility-administered medications on file prior to visit.    Past Medical History  Diagnosis Date  . ALLERGIC RHINITIS 11/25/2007  . ANXIETY 11/25/2007  . COLONIC POLYPS, HX OF 11/25/2007  . CORONARY ARTERY DISEASE 11/25/2007  . Cough 01/04/2009  . HEMORRHOIDS, INTERNAL 11/06/2010  . HOARSENESS 01/04/2009  . HYPERLIPIDEMIA 11/25/2007  . HYPERTENSION 11/25/2007  . LOW BACK PAIN 11/25/2007  . PEPTIC ULCER DISEASE 11/25/2007  . Preventative health care 04/07/2011  . PSA, INCREASED 01/05/2009  . TREMOR, ESSENTIAL 11/25/2007  . Lumbar radiculopathy, chronic     with right foot drop  . Right foot drop   . Impaired glucose tolerance  04/21/2013    Past Surgical History  Procedure Laterality Date  . S/p lumbar laminectomy and fusion    . Appendectomy    . Tonsillectomy    . Coronary artery bypass graft      History   Social History  . Marital Status: Married    Spouse Name: N/A    Number of Children: N/A  . Years of Education: N/A   Occupational History  . Not on file.   Social History Main Topics  . Smoking status: Never Smoker   . Smokeless tobacco: Not on file  . Alcohol Use: Yes     Comment: Occ  . Drug Use: No  . Sexual Activity: Not on file   Other Topics Concern  . Not on file   Social History Narrative   Son is internist in [place]    Family Status  Relation Status Death Age  . Father Deceased 76    Colon Cancer  . Mother Deceased 79    Old Age  . Son Alive     3    Review of Systems A complete 10 system ROS was obtained and was negative apart from what is mentioned.   Objective:   VITALS:   Filed Vitals:   09/21/13 1243  BP: 142/70  Pulse: 68  Temp: 97.6 F (36.4 C)  Resp: 14  Height: 5\' 10"  (1.778 m)  Weight: 186 lb 11.2 oz (84.687 kg)   Gen:  Appears stated age and in NAD. HEENT:  Normocephalic, atraumatic. The mucous membranes are moist. The superficial temporal arteries are without ropiness or tenderness. Cardiovascular: Regular rate and rhythm. Lungs: Clear to auscultation bilaterally. Neck: There are no carotid bruits noted bilaterally.  NEUROLOGICAL:  Orientation:  The patient is alert and oriented x 3.  Recent and remote memory are intact.  Attention span and concentration are normal.  Able to name objects and repeat without trouble.  Fund of knowledge is appropriate Cranial nerves: There is good facial symmetry. [name] Speech is fluent and clear but there is mild spasmodic dysphonia with mild hoarseness of the voice. Soft palate rises symmetrically and there is no tongue deviation. Hearing is intact to conversational tone. Tone: Tone is good  throughout. Sensation: Sensation is intact to light touch and pinprick throughout (facial, trunk, extremities). Vibration is absent in the LE. There is no extinction with double simultaneous stimulation. There is no sensory dermatomal level identified. Coordination:  The patient has no dysdiadichokinesia or  dysmetria. Motor: Strength is 5/5 in the bilateral upper and lower extremities with the exception of 0/5 ankle dorsiflexion b/l.  There is 0/5 strength in b/l EDB.  There is 3/5 ankle eversion on the L and 0/5 on the R.  Shoulder shrug is equal bilaterally.  There is no pronator drift.  There are no fasciculations noted. Gait and Station: The patient has a "marching gait" on the R due to foot drop.  MOVEMENT EXAM: Tremor:  There is  tremor in the UE, noted most significantly with action.  It is mild-mod on the R (improved) and mild on the L.   A definite postural tremor is noted. There is no tremor at rest.  The patient is able to pour water from one glass to another but spills some, but not all (much improved).  There is a jaw tremor and rare tremor of the head in the "yes" direction     Assessment/Plan:   1.  Essential Tremor.  -This is somewhat asymmetric, but that is not unheard of.  He has no features of any other disease.  We talked about areas treatments.  He is definitely not interested in DBS, and I don't disagree with him.  Primidone has been beneficial, but the patient is wondering if he is having side effects.  I asked him to go ahead and stop it just for the next 2 days and to call me on Friday morning.  He will let me know if he still feels the "tiredness" in the eyes.  If so, we will likely resume the primidone since we know it is not from that and will likely even increase the dose.  If not, then we will have to consider either consolidating all of the dosage at night to see if that helps, or seeing if his cardiologist would allow [place] to change the metoprolol to Inderal. 2.  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

## 2013-09-24 ENCOUNTER — Ambulatory Visit: Payer: Medicare Other | Admitting: Neurology

## 2013-09-24 ENCOUNTER — Telehealth: Payer: Self-pay

## 2013-09-24 NOTE — Telephone Encounter (Signed)
That's good.  Have him stay off of it until Monday to see if it resolves completely.  IF it does, our option is to try it just at night and see if that is better (rather than bid) or to change to inderal (I talked with Dr. Eden EmmsNishan and he said that we could change his metoprolol to that if needed).

## 2013-09-24 NOTE — Telephone Encounter (Signed)
Pt called to inform Dr.Tat that his vision has almost completely returned to normal since stopping the Primidone.

## 2013-09-27 MED ORDER — PROPRANOLOL HCL ER 60 MG PO CP24: 60.0000 mg | ORAL_CAPSULE | Freq: Every day | ORAL | Status: DC

## 2013-09-27 NOTE — Telephone Encounter (Signed)
Sent Rx to pharmacy. Called pt and relayed your message. Made f/u appointment for 3/30.

## 2013-09-27 NOTE — Telephone Encounter (Signed)
Tell him to hold the metoprolol.  Instead give him RX for inderal LA 60 mg once daily.  We may need higher dose but need him to monitor BP and pulse at home.  Call in #30 with 2 refills and I will see him in 2-3 months (make appt)

## 2013-09-27 NOTE — Telephone Encounter (Signed)
Pt says his vision problems have completely resolved now. He is interested in trying the new medication.

## 2013-12-13 ENCOUNTER — Ambulatory Visit (INDEPENDENT_AMBULATORY_CARE_PROVIDER_SITE_OTHER): Payer: Medicare Other | Admitting: Neurology

## 2013-12-13 ENCOUNTER — Encounter: Payer: Self-pay | Admitting: Gastroenterology

## 2013-12-13 ENCOUNTER — Encounter: Payer: Self-pay | Admitting: Neurology

## 2013-12-13 VITALS — BP 132/76 | HR 60 | Resp 14 | Ht 70.0 in | Wt 184.0 lb

## 2013-12-13 DIAGNOSIS — G250 Essential tremor: Secondary | ICD-10-CM

## 2013-12-13 DIAGNOSIS — G252 Other specified forms of tremor: Principal | ICD-10-CM

## 2013-12-13 MED ORDER — PROPRANOLOL HCL ER 60 MG PO CP24: 60.0000 mg | ORAL_CAPSULE | Freq: Every day | ORAL | Status: DC

## 2013-12-13 NOTE — Progress Notes (Signed)
Subjective:   Ronald [name] was seen in consultation in the movement disorder clinic at the request of Dr. Eden Emms.  His PCP is Oliver Barre, MD.  The evaluation is for tremor.  The patient is a 80 y.o. right handed male with a history of tremor.  The tremor is primarily in the R and in the jaw.  The jaw does not bother him.  There is no family hx of tremor.    It is worse in the AM and worse with use.    Affected by caffeine:  Doesn't drink caffeine Affected by alcohol:  Yes, improves Affected by stress:  yes Affected by fatigue:  yes Spills soup if on spoon:  yes Spills spoonful of peas:  yes Spills glass of liquid if full:  yes if doesn't use both hands Affects ADL's (tying shoes, brushing teeth, etc): minimal  He does have a hx of foot drop x 3 years.  He had lumbar laminectomy several years ago after developing b/l foot drop and the laminectomy greatly helped the pain but the foot drop has been static.  The R is worse than the L.  09/21/13 update:  The pt is f/u today regarding ET.  I started him on primidone last visit, in early October.  He initially did well but seemed to lose efficacy and by 10/30, he had contacted me and we increased it to 50 mg bid.  Again, on 08/16/13, it was increased to 100 mg in the AM and 50 mg in the PM.  Again, he did well for a few days but then seemed to lose efficacy.  He states it is not as bad as it was before the medication but not as good as when he initially started it.  He also now has a feeling like a "tiredness" comes over his eyes, as if he needs to close them.  He does not describe eyelid opening apraxia.  He has noted changes in the voice as well.  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  Occasionally, he will have momentary double vision, but states that he can "blink it away."  He did see the ophthalmologist since last visit and the examination was unremarkable, and  unchanged from 3 years ago.    12/13/13 update:  The patient is following up regarding his essential tremor talk to his cardiologist and he reported that it would be okay to change his beta blocker to Inderal LA, 60 mg.  The patient felt that the primidone caused his eyes to be "tired."  When he stopped the primidone, that seemed to get better but tremor got worse.  He is no longer having the tired feeling of the eyes.   He is exercising without a problem.  "I feel that I am 80 years old, I am not embarrassed by the tremor, I don't have to do things that require significant precision any longer."  He still has trouble pouring water from a glass.  Has trouble with signature and handwriting.  Current/Previously tried tremor medications: n/a but on metoprolol for other reasons.  Current medications that may exacerbate tremor:  n/a  Outside reports reviewed: historical medical records.  Allergies  Allergen Reactions  . Atorvastatin     REACTION: leg pain  . Niacin   . Simvastatin     Current Outpatient Prescriptions on File Prior to Visit  Medication Sig Dispense Refill  . aspirin  81 MG tablet Take 81 mg by mouth daily.        . Coenzyme Q10 (COQ10 PO) Take by mouth daily.      . Cyanocobalamin (VITAMIN B 12 PO) Take by mouth daily.        . hydrochlorothiazide (HYDRODIURIL) 25 MG tablet Take 0.5 tablets (12.5 mg total) by mouth as needed.  30 tablet  6  . irbesartan (AVAPRO) 150 MG tablet TAKE ONE TABLET BY MOUTH DAILY  90 tablet  3  . metoprolol (LOPRESSOR) 50 MG tablet TAKE 1 AND 1/2 TABLETS BY MOUTH EVERY MORNING AND 1 TABLET EVERYEVENING  75 tablet  11  . OMEGA 3 1200 MG CAPS Take 3 tablets by mouth once a day       . primidone (MYSOLINE) 50 MG tablet Take 100mg  in the morning and 50mg  at night.  90 tablet  5  . rosuvastatin (CRESTOR) 40 MG tablet Take 1/2 by mouth daily  45 tablet  3  . valACYclovir (VALTREX) 1000 MG tablet Take 1 tablet (1,000 mg total) by mouth 3 (three) times daily.   21 tablet  0   No current facility-administered medications on file prior to visit.    Past Medical History  Diagnosis Date  . ALLERGIC RHINITIS 11/25/2007  . ANXIETY 11/25/2007  . COLONIC POLYPS, HX OF 11/25/2007  . CORONARY ARTERY DISEASE 11/25/2007  . Cough 01/04/2009  . HEMORRHOIDS, INTERNAL 11/06/2010  . HOARSENESS 01/04/2009  . HYPERLIPIDEMIA 11/25/2007  . HYPERTENSION 11/25/2007  . LOW BACK PAIN 11/25/2007  . PEPTIC ULCER DISEASE 11/25/2007  . Preventative health care 04/07/2011  . PSA, INCREASED 01/05/2009  . TREMOR, ESSENTIAL 11/25/2007  . Lumbar radiculopathy, chronic     with right foot drop  . Right foot drop   . Impaired glucose tolerance 04/21/2013    Past Surgical History  Procedure Laterality Date  . S/p lumbar laminectomy and fusion    . Appendectomy    . Tonsillectomy    . Coronary artery bypass graft      History   Social History  . Marital Status: Married    Spouse Name: N/A    Number of Children: N/A  . Years of Education: N/A   Occupational History  . Not on file.   Social History Main Topics  . Smoking status: Never Smoker   . Smokeless tobacco: Not on file  . Alcohol Use: Yes     Comment: Occ  . Drug Use: No  . Sexual Activity: Not on file   Other Topics Concern  . Not on file   Social History Narrative   Son is internist in [place]    Family Status  Relation Status Death Age  . Father Deceased 3267    Colon Cancer  . Mother Deceased 6293    Old Age  . Son Alive     3    Review of Systems A complete 10 system ROS was obtained and was negative apart from what is mentioned.   Objective:   VITALS:   Filed Vitals:   12/13/13 0842  BP: 132/76  Pulse: 60  Resp: 14  Height: 5\' 10"  (1.778 m)  Weight: 184 lb (83.462 kg)   Gen:  Appears stated age and in NAD. HEENT:  Normocephalic, atraumatic. The mucous membranes are moist. The superficial temporal  NEUROLOGICAL:  Orientation:  The patient is alert and oriented x 3.  Recent and  remote memory are intact.  Attention span and concentration are normal.  Able  to name objects and repeat without trouble.  Fund of knowledge is appropriate Cranial nerves: There is good facial symmetry. [name] Speech is fluent and clear but there is mild spasmodic dysphonia with mild hoarseness of the voice. Soft palate rises symmetrically and there is no tongue deviation. Hearing is intact to conversational tone. Tone: Tone is good throughout. Sensation: Sensation is intact to light touch throughout. Coordination:  The patient has no dysdiadichokinesia or dysmetria. Motor: Strength is 5/5 in the bilateral upper and lower extremities with the exception of 0/5 ankle dorsiflexion b/l.  There is 0/5 strength in b/l EDB.  There is 3/5 ankle eversion on the L and 0/5 on the R.  Shoulder shrug is equal bilaterally.  There is no pronator drift.  There are no fasciculations noted. Gait and Station: The patient has a "marching gait" on the R due to foot drop.  MOVEMENT EXAM: Tremor:  There is  tremor in the UE, noted most significantly with action.  It is mild-mod on the R (improved) and none noted on the L today.   He has trouble with archimedes spirals.  There is a jaw tremor and rare tremor of the head in the "yes" direction     Assessment/Plan:   1.  Essential Tremor.  -This is somewhat asymmetric, but that is not unheard of.  He has no features of any other disease.  We talked about various treatments.  He is definitely not interested in DBS, and I don't disagree with him.  Primidone has been beneficial, but the patient felt he had "tired eyes" on the medication.  He doesn't think that the inderal LA has been as beneficial but he kept a daily BP log and is very happy with that.  He doesn't want to try and push the inderal dose up and really would like to just stay with the same dose, inderal LA 60 mg daily.  He can certainly let me know if he changes his mind. 2.  Spasmodic dysphonia  -He does have mild  spasmodic dysphonia and is not interested in vocal cord botox. 3.  F/u one year

## 2014-01-12 ENCOUNTER — Encounter: Payer: Self-pay | Admitting: Gastroenterology

## 2014-02-09 ENCOUNTER — Telehealth: Payer: Self-pay | Admitting: *Deleted

## 2014-02-09 MED ORDER — ATORVASTATIN CALCIUM 40 MG PO TABS: 40.0000 mg | ORAL_TABLET | Freq: Every day | ORAL | Status: DC

## 2014-02-09 NOTE — Telephone Encounter (Signed)
Ok change to lipitor - done erx

## 2014-02-09 NOTE — Telephone Encounter (Signed)
Pt called states his Crestor Rx is too expensive.  Pt is requesting a less expensive Rx.  Please advise

## 2014-02-10 NOTE — Telephone Encounter (Signed)
Patient informed medication changed

## 2014-03-10 ENCOUNTER — Ambulatory Visit (INDEPENDENT_AMBULATORY_CARE_PROVIDER_SITE_OTHER): Payer: Medicare Other | Admitting: Gastroenterology

## 2014-03-10 ENCOUNTER — Encounter: Payer: Self-pay | Admitting: Gastroenterology

## 2014-03-10 VITALS — BP 132/74 | HR 64 | Ht 70.0 in | Wt 181.0 lb

## 2014-03-10 DIAGNOSIS — Z86010 Personal history of colonic polyps: Secondary | ICD-10-CM

## 2014-03-10 DIAGNOSIS — Z800 Family history of malignant neoplasm of digestive organs: Secondary | ICD-10-CM

## 2014-03-10 NOTE — Assessment & Plan Note (Signed)
I see no documentation of this by either colonoscopy report or pathology report.

## 2014-03-10 NOTE — Assessment & Plan Note (Signed)
Patient is unsure but thinks his father may have had colon cancer in his 5060s.  He offers no GI complaints.  Last examined 2010 was normal.  In view of the patient's age and absence of symptoms I will stop routine colorectal cancer screening

## 2014-03-10 NOTE — Progress Notes (Signed)
_                                                                                                                History of Present Illness: Patient is here for consideration of screening colonoscopy.  He believes his father had colon cancer in his 3960s.  Last colonoscopy in 2010 was normal.  He has no GI complaints including change of bowel habits, abdominal pain, melena or hematochezia.  Altogether he is feeling well.    Past Medical History  Diagnosis Date  . ALLERGIC RHINITIS 11/25/2007  . ANXIETY 11/25/2007  . COLONIC POLYPS, HX OF 11/25/2007  . CORONARY ARTERY DISEASE 11/25/2007  . Cough 01/04/2009  . HEMORRHOIDS, INTERNAL 11/06/2010  . HOARSENESS 01/04/2009  . HYPERLIPIDEMIA 11/25/2007  . HYPERTENSION 11/25/2007  . LOW BACK PAIN 11/25/2007  . PEPTIC ULCER DISEASE 11/25/2007  . Preventative health care 04/07/2011  . PSA, INCREASED 01/05/2009  . TREMOR, ESSENTIAL 11/25/2007  . Lumbar radiculopathy, chronic     with right foot drop  . Right foot drop   . Impaired glucose tolerance 04/21/2013  . Spasmodic dysphonia    Past Surgical History  Procedure Laterality Date  . S/p lumbar laminectomy and fusion    . Appendectomy    . Tonsillectomy    . Coronary artery bypass graft    . Back surgery     family history includes Colon cancer in his father. Current Outpatient Prescriptions  Medication Sig Dispense Refill  . aspirin 81 MG tablet Take 81 mg by mouth daily.        [name]. atorvastatin (LIPITOR) 40 MG tablet Take 1 tablet (40 mg total) by mouth daily.  90 tablet  3  . Coenzyme Q10 (COQ10 PO) Take by mouth daily.      . Cyanocobalamin (VITAMIN B 12 PO) Take by mouth daily.        . hydrochlorothiazide (HYDRODIURIL) 25 MG tablet Take 0.5 tablets (12.5 mg total) by mouth as needed.  30 tablet  6  . irbesartan (AVAPRO) 150 MG tablet TAKE ONE TABLET BY MOUTH DAILY  90 tablet  3  . OMEGA 3 1200 MG CAPS Take 3 tablets by mouth once a day       . propranolol ER (INDERAL LA) 60  MG 24 hr capsule Take 1 capsule (60 mg total) by mouth daily.  90 capsule  3   No current facility-administered medications for this visit.   Allergies as of 03/10/2014 - Review Complete 03/10/2014  Allergen Reaction Noted  . Atorvastatin  11/25/2007  . Niacin  11/25/2007  . Simvastatin  11/25/2007    reports that he has never smoked. He has never used smokeless tobacco. He reports that he drinks alcohol. He reports that he does not use illicit drugs.     Review of Systems: He has a right peroneal palsy with some slight difficulties in gait.  He complains of back pain.  Pertinent positive and negative review of systems were noted in the above HPI section.  All other review of systems were otherwise negative.  Vital signs were reviewed in today's medical record Physical Exam: General: Well developed , well nourished, no acute distress Skin: anicteric Head: Normocephalic and atraumatic Eyes:  sclerae anicteric, EOMI Ears: Normal auditory acuity Mouth: No deformity or lesions Neck: Supple, no masses or thyromegaly Lungs: Clear throughout to auscultation Heart: Regular rate and rhythm; no murmurs, rubs or bruits Abdomen: Soft, non tender and non distended. No masses, hepatosplenomegaly or hernias noted. Normal Bowel sounds Rectal:deferred Musculoskeletal: Symmetrical with no gross deformities  Skin: No lesions on visible extremities Pulses:  Normal pulses noted Extremities: No clubbing, cyanosis, edema or deformities noted Neurological: Alert oriented x 4, grossly nonfocal.  He has a resting tremor Cervical Nodes:  No significant cervical adenopathy Inguinal Nodes: No significant inguinal adenopathy Psychological:  Alert and cooperative. Normal mood and affect  See Assessment and Plan under Problem List

## 2014-03-10 NOTE — Patient Instructions (Signed)
Follow up as needed

## 2014-04-08 ENCOUNTER — Other Ambulatory Visit: Payer: Self-pay | Admitting: Internal Medicine

## 2014-04-28 ENCOUNTER — Other Ambulatory Visit (INDEPENDENT_AMBULATORY_CARE_PROVIDER_SITE_OTHER): Payer: Medicare Other

## 2014-04-28 ENCOUNTER — Ambulatory Visit (INDEPENDENT_AMBULATORY_CARE_PROVIDER_SITE_OTHER): Payer: Medicare Other | Admitting: Internal Medicine

## 2014-04-28 ENCOUNTER — Encounter: Payer: Self-pay | Admitting: Internal Medicine

## 2014-04-28 VITALS — BP 112/66 | HR 74 | Temp 98.0°F | Ht 70.0 in | Wt 179.2 lb

## 2014-04-28 DIAGNOSIS — Z0000 Encounter for general adult medical examination without abnormal findings: Secondary | ICD-10-CM

## 2014-04-28 DIAGNOSIS — E785 Hyperlipidemia, unspecified: Secondary | ICD-10-CM

## 2014-04-28 DIAGNOSIS — R7309 Other abnormal glucose: Secondary | ICD-10-CM

## 2014-04-28 DIAGNOSIS — R7302 Impaired glucose tolerance (oral): Secondary | ICD-10-CM

## 2014-04-28 DIAGNOSIS — Z136 Encounter for screening for cardiovascular disorders: Secondary | ICD-10-CM

## 2014-04-28 DIAGNOSIS — Z23 Encounter for immunization: Secondary | ICD-10-CM

## 2014-04-28 DIAGNOSIS — I2510 Atherosclerotic heart disease of native coronary artery without angina pectoris: Secondary | ICD-10-CM

## 2014-04-28 LAB — URINALYSIS, ROUTINE W REFLEX MICROSCOPIC
BILIRUBIN URINE: NEGATIVE
HGB URINE DIPSTICK: NEGATIVE
KETONES UR: NEGATIVE
Leukocytes, UA: NEGATIVE
Nitrite: NEGATIVE
Specific Gravity, Urine: <=1.005 — AB (ref 1.000–1.030)
Total Protein, Urine: NEGATIVE
URINE GLUCOSE: NEGATIVE
UROBILINOGEN UA: 0.2 (ref 0.0–1.0)
pH: 6 (ref 5.0–8.0)

## 2014-04-28 LAB — CBC WITH DIFFERENTIAL/PLATELET
BASOS ABS: 0 10*3/uL (ref 0.0–0.1)
Basophils Relative: 0.2 % (ref 0.0–3.0)
Eosinophils Absolute: 0.1 10*3/uL (ref 0.0–0.7)
Eosinophils Relative: 3.4 % (ref 0.0–5.0)
HCT: 38.1 % — ABNORMAL LOW (ref 39.0–52.0)
Hemoglobin: 13 g/dL (ref 13.0–17.0)
LYMPHS ABS: 0.9 10*3/uL (ref 0.7–4.0)
LYMPHS PCT: 20.8 % (ref 12.0–46.0)
MCHC: 34.2 g/dL (ref 30.0–36.0)
MCV: 105.4 fl — ABNORMAL HIGH (ref 78.0–100.0)
Monocytes Absolute: 0.7 10*3/uL (ref 0.1–1.0)
Monocytes Relative: 15.7 % — ABNORMAL HIGH (ref 3.0–12.0)
Neutro Abs: 2.6 10*3/uL (ref 1.4–7.7)
Neutrophils Relative %: 59.9 % (ref 43.0–77.0)
Platelets: 214 10*3/uL (ref 150.0–400.0)
RBC: 3.62 Mil/uL — ABNORMAL LOW (ref 4.22–5.81)
RDW: 14.4 % (ref 11.5–15.5)
WBC: 4.3 10*3/uL (ref 4.0–10.5)

## 2014-04-28 LAB — HEPATIC FUNCTION PANEL
ALK PHOS: 56 U/L (ref 39–117)
ALT: 26 U/L (ref 0–53)
AST: 31 U/L (ref 0–37)
Albumin: 3.6 g/dL (ref 3.5–5.2)
BILIRUBIN DIRECT: 0.2 mg/dL (ref 0.0–0.3)
TOTAL PROTEIN: 6.4 g/dL (ref 6.0–8.3)
Total Bilirubin: 1.4 mg/dL — ABNORMAL HIGH (ref 0.2–1.2)

## 2014-04-28 LAB — LIPID PANEL
CHOL/HDL RATIO: 2
Cholesterol: 135 mg/dL (ref 0–200)
HDL: 56.8 mg/dL (ref >39.00)
LDL Cholesterol: 72 mg/dL (ref 0–99)
NONHDL: 78.2
Triglycerides: 30 mg/dL (ref 0.0–149.0)
VLDL: 6 mg/dL (ref 0.0–40.0)

## 2014-04-28 LAB — TSH: TSH: 2.51 u[IU]/mL (ref 0.35–4.50)

## 2014-04-28 LAB — BASIC METABOLIC PANEL
BUN: 18 mg/dL (ref 6–23)
CO2: 30 meq/L (ref 19–32)
Calcium: 9 mg/dL (ref 8.4–10.5)
Chloride: 103 mEq/L (ref 96–112)
Creatinine, Ser: 1.1 mg/dL (ref 0.4–1.5)
GFR: 70.23 mL/min (ref >60.00)
Glucose, Bld: 104 mg/dL — ABNORMAL HIGH (ref 70–99)
POTASSIUM: 4.5 meq/L (ref 3.5–5.1)
SODIUM: 136 meq/L (ref 135–145)

## 2014-04-28 LAB — HEMOGLOBIN A1C: HEMOGLOBIN A1C: 6.2 % (ref 4.6–6.5)

## 2014-04-28 NOTE — Assessment & Plan Note (Signed)

## 2014-04-28 NOTE — Progress Notes (Signed)
Subjective:    Patient ID: Ronald [name], male    DOB: 12/17/1929, 80 y.o.   MRN: [id number]  HPI  Here for wellness and f/u;  Overall doing ok;  Pt denies CP, worsening SOB, DOE, wheezing, orthopnea, PND, worsening LE edema, palpitations, dizziness or syncope.  Pt denies neurological change such as new headache, facial or extremity weakness.  Pt denies polydipsia, polyuria, or low sugar symptoms. Pt states overall good compliance with treatment and medications, good tolerability, and has been trying to follow lower cholesterol diet.  Pt denies worsening depressive symptoms, suicidal ideation or panic. No fever, night sweats, wt loss, loss of appetite, or other constitutional symptoms.  Pt states good ability with ADL's, has low fall risk, home safety reviewed and adequate, no other significant changes in hearing or vision, and only very active with exercise ofr age.  Did start HCT per cardiology for distal edema.  Tolerating statin. Due for fu card in sept, but does not have appt. Son (MD) sent him 14 wks crestor, wants to take this for now instead of lipitor, then re-start the lipitor. Currently on lipitor. Past Medical History  Diagnosis Date  . ALLERGIC RHINITIS 11/25/2007  . ANXIETY 11/25/2007  . COLONIC POLYPS, HX OF 11/25/2007  . CORONARY ARTERY DISEASE 11/25/2007  . Cough 01/04/2009  . HEMORRHOIDS, INTERNAL 11/06/2010  . HOARSENESS 01/04/2009  . HYPERLIPIDEMIA 11/25/2007  . HYPERTENSION 11/25/2007  . LOW BACK PAIN 11/25/2007  . PEPTIC ULCER DISEASE 11/25/2007  . Preventative health care 04/07/2011  . PSA, INCREASED 01/05/2009  . TREMOR, ESSENTIAL 11/25/2007  . Lumbar radiculopathy, chronic     with right foot drop  . Right foot drop   . Impaired glucose tolerance 04/21/2013  . Spasmodic dysphonia    Past Surgical History  Procedure Laterality Date  . S/p lumbar laminectomy and fusion    . Appendectomy    . Tonsillectomy    . Coronary artery bypass graft    . Back surgery      reports that  he has never smoked. He has never used smokeless tobacco. He reports that he drinks alcohol. He reports that he does not use illicit drugs. family history includes Colon cancer in his father. Allergies  Allergen Reactions  . Atorvastatin     REACTION: leg pain  . Niacin   . Simvastatin    Review of Systems Constitutional: Negative for increased diaphoresis, other activity, appetite or other siginficant weight change  HENT: Negative for worsening hearing loss, ear pain, facial swelling, mouth sores and neck stiffness.   Eyes: Negative for other worsening pain, redness or visual disturbance.  Respiratory: Negative for shortness of breath and wheezing.   Cardiovascular: Negative for chest pain and palpitations.  Gastrointestinal: Negative for diarrhea, blood in stool, abdominal distention or other pain Genitourinary: Negative for hematuria, flank pain or change in urine volume.  Musculoskeletal: Negative for myalgias or other joint complaints.  Skin: Negative for color change and wound.  Neurological: Negative for syncope and numbness. other than noted Hematological: Negative for adenopathy. or other swelling Psychiatric/Behavioral: Negative for hallucinations, self-injury, decreased concentration or other worsening agitation.      Objective:   Physical Exam BP 112/66  Pulse 74  Temp(Src) 98 F (36.7 C) (Oral)  Ht 5\' 10"  (1.778 m)  Wt 179 lb 4 oz (81.307 kg)  BMI 25.72 kg/m2  SpO2 97% VS noted,  Constitutional: Pt is oriented to person, place, and time. Appears well-developed and well-nourished.  Head: Normocephalic and  atraumatic.  Right Ear: External ear normal.  Left Ear: External ear normal.  Nose: Nose normal.  Mouth/Throat: Oropharynx is clear and moist.  Eyes: Conjunctivae and EOM are normal. Pupils are equal, round, and reactive to light.  Neck: Normal range of motion. Neck supple. No JVD present. No tracheal deviation present.  Cardiovascular: Normal rate, regular  rhythm, normal heart sounds and intact distal pulses.   Pulmonary/Chest: Effort normal and breath sounds without rales or wheezing  Abdominal: Soft. Bowel sounds are normal. NT. No HSM  Musculoskeletal: Normal range of motion. Exhibits no edema.  Lymphadenopathy:  Has no cervical adenopathy.  Neurological: Pt is alert and oriented to person, place, and time. Pt has normal reflexes. No cranial nerve deficit. Motor grossly intact Skin: Skin is warm and dry. No rash noted.  Psychiatric:  Has normal mood and affect. Behavior is normal.     Assessment & Plan:

## 2014-04-28 NOTE — Assessment & Plan Note (Signed)
Currently on lipitor x 2 mo, goal LDL < 70, pt wants to stay on lipitor generic if possible, did d/w pt the improved CV risk reduction with keeping the LDL < 70, may need crestor if not able with lipitor

## 2014-04-28 NOTE — Progress Notes (Signed)
Pre visit review using our clinic review tool, if applicable. No additional management support is needed unless otherwise documented below in the visit note. 

## 2014-04-28 NOTE — Patient Instructions (Addendum)
You had the new Prevnar pneuomonia shot today  Your EKG was OK today  Please continue all other medications as before, and refills have been done if requested.  Please have the pharmacy call with any other refills you may need.  Please continue your efforts at being more active, low cholesterol diet, and weight control.  You are otherwise up to date with prevention measures today.  Please keep your appointments with your specialists as you may have planned  You will be contacted regarding the referral for: cardiology - Dr Eden EmmsNishan  Please go to the LAB in the Basement (turn left off the elevator) for the tests to be done today  You will be contacted by phone if any changes need to be made immediately.  Otherwise, you will receive a letter about your results with an explanation, but please check with MyChart first.  Please remember to sign up for MyChart if you have not done so, as this will be important to you in the future with finding out test results, communicating by private email, and scheduling acute appointments online when needed.  Please return in 1 year for your yearly visit, or sooner if needed, with Lab testing done 3-5 days before

## 2014-04-28 NOTE — Assessment & Plan Note (Signed)
Also for a1c , asympt, follow with diet, wt control

## 2014-05-09 ENCOUNTER — Encounter: Payer: Self-pay | Admitting: Gastroenterology

## 2014-06-17 ENCOUNTER — Ambulatory Visit (INDEPENDENT_AMBULATORY_CARE_PROVIDER_SITE_OTHER): Payer: Medicare Other | Admitting: Cardiovascular Disease

## 2014-06-17 ENCOUNTER — Encounter: Payer: Self-pay | Admitting: Cardiovascular Disease

## 2014-06-17 VITALS — BP 122/54 | HR 71 | Ht 70.0 in | Wt 180.4 lb

## 2014-06-17 DIAGNOSIS — R600 Localized edema: Secondary | ICD-10-CM

## 2014-06-17 DIAGNOSIS — R251 Tremor, unspecified: Secondary | ICD-10-CM

## 2014-06-17 DIAGNOSIS — E785 Hyperlipidemia, unspecified: Secondary | ICD-10-CM

## 2014-06-17 DIAGNOSIS — G252 Other specified forms of tremor: Secondary | ICD-10-CM

## 2014-06-17 DIAGNOSIS — I10 Essential (primary) hypertension: Secondary | ICD-10-CM

## 2014-06-17 DIAGNOSIS — I2510 Atherosclerotic heart disease of native coronary artery without angina pectoris: Secondary | ICD-10-CM

## 2014-06-17 DIAGNOSIS — G250 Essential tremor: Secondary | ICD-10-CM

## 2014-06-17 DIAGNOSIS — R609 Edema, unspecified: Secondary | ICD-10-CM

## 2014-06-17 NOTE — Assessment & Plan Note (Signed)
F/U [name] failed primadone Stable no evidence of parkinsons

## 2014-06-17 NOTE — Assessment & Plan Note (Signed)
Dependant resolved with low dose diuretic that he takes 3-4/ per week

## 2014-06-17 NOTE — Assessment & Plan Note (Signed)
Well controlled.  Continue current medications and low sodium Dash type diet.    

## 2014-06-17 NOTE — Assessment & Plan Note (Signed)
Stable with no angina and good activity level.  Continue medical Rx  

## 2014-06-17 NOTE — Assessment & Plan Note (Signed)
Cholesterol is at goal.  Continue current dose of statin and diet Rx.  No myalgias or side effects.  F/U  LFT's in 6 months. Lab Results  Component Value Date   LDLCALC 72 04/28/2014

## 2014-06-17 NOTE — Progress Notes (Signed)
Patient ID: Ronald BowlerJohn A [name], male   DOB: 11/28/1929, 80 y.o.   MRN: [id number] The patient is 80 years old and return for management of CAD. Previously seen by Dr Juanda ChanceBrodie [name] not been seen in over 3 years Primary is Dr Ronald RuizJohn  He had bypass surgery in 1998. His last catheterization was in 2003 at which time he had total occlusion of the vein graft to the right coronary and good LV function. Get a negative Myoview in 2008.  He's been doing quite well [name] had no recent chest pain shortness of breath or palpitations. 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 His wife [name] been diagnosed with lung cancer.    [name] RLE edema that [name] improved on diuretic and mandibular tremor No other signs of parkinsons but no familial history of tremor Tried primadone but no help  Wife very sick with lung CA Hospitalized for talc Rx and got very ill   ROS: Denies fever, malais, weight loss, blurry vision, decreased visual acuity, cough, sputum, SOB, hemoptysis, pleuritic pain, palpitaitons, heartburn, abdominal pain, melena, lower extremity edema, claudication, or rash.  All other systems reviewed and negative  General: Affect appropriate Healthy:  appears stated age HEENT: normal Neck supple with no adenopathy JVP normal no bruits no thyromegaly Lungs clear with no wheezing and good diaphragmatic motion Heart:  S1/S2 no murmur, no rub, gallop or click PMI normal Abdomen: benighn, BS positve, no tenderness, no AAA no bruit.  No HSM or HJR Distal pulses intact with no bruits No edema Neuro right facial and right UE tremor  Skin warm and dry No muscular weakness Gait somwhat antalgic    Current Outpatient Prescriptions  Medication Sig Dispense Refill  . aspirin 81 MG tablet Take 81 mg by mouth daily.        [name]. atorvastatin (LIPITOR) 40 MG tablet Take 1 tablet (40 mg  total) by mouth daily.  90 tablet  3  . Coenzyme Q10 (COQ10 PO) Take by mouth daily.      . Cyanocobalamin (VITAMIN B 12 PO) Take by mouth daily.        . hydrochlorothiazide (HYDRODIURIL) 25 MG tablet Take 0.5 tablets (12.5 mg total) by mouth as needed.  30 tablet  6  . irbesartan (AVAPRO) 150 MG tablet TAKE ONE TABLET BY MOUTH ONCE DAILY  90 tablet  0  . OMEGA 3 1200 MG CAPS Take 3 tablets by mouth once a day       . propranolol ER (INDERAL LA) 60 MG 24 hr capsule Take 1 capsule (60 mg total) by mouth daily.  90 capsule  3   No current facility-administered medications for this visit.    Allergies  Atorvastatin; Niacin; and Simvastatin  Electrocardiogram: SR RBBB LAD  ? Old IMI   Assessment and Plan

## 2014-07-09 ENCOUNTER — Other Ambulatory Visit: Payer: Self-pay | Admitting: Internal Medicine

## 2014-08-03 ENCOUNTER — Other Ambulatory Visit: Payer: Self-pay | Admitting: Cardiovascular Disease

## 2014-12-07 DIAGNOSIS — G43809 Other migraine, not intractable, without status migrainosus: Secondary | ICD-10-CM | POA: Diagnosis not present

## 2014-12-19 ENCOUNTER — Other Ambulatory Visit: Payer: Self-pay | Admitting: Neurology

## 2014-12-19 NOTE — Telephone Encounter (Signed)
Inderal refill requested. Per last office note- patient to remain on medication. He is do for yearly follow up. Refill approved with a note to pharmacy that patient needs to make appt for further refills and 30 day supply sent to patient's pharmacy.

## 2014-12-26 ENCOUNTER — Ambulatory Visit (INDEPENDENT_AMBULATORY_CARE_PROVIDER_SITE_OTHER): Payer: Medicare Other | Admitting: Neurology

## 2014-12-26 ENCOUNTER — Encounter: Payer: Self-pay | Admitting: Neurology

## 2014-12-26 VITALS — BP 134/78 | HR 76 | Ht 70.0 in | Wt 181.0 lb

## 2014-12-26 DIAGNOSIS — G250 Essential tremor: Secondary | ICD-10-CM

## 2014-12-26 DIAGNOSIS — R251 Tremor, unspecified: Secondary | ICD-10-CM | POA: Diagnosis not present

## 2014-12-26 DIAGNOSIS — J383 Other diseases of vocal cords: Secondary | ICD-10-CM

## 2014-12-26 DIAGNOSIS — G252 Other specified forms of tremor: Principal | ICD-10-CM

## 2014-12-26 MED ORDER — PROPRANOLOL HCL ER 60 MG PO CP24: 60.0000 mg | ORAL_CAPSULE | Freq: Every day | ORAL | Status: DC

## 2014-12-26 NOTE — Progress Notes (Signed)
Subjective:   Ronald BowlerJohn A [name] was seen in consultation in the movement disorder clinic at the request of Dr. Eden EmmsNishan.  His PCP is Ronald BarreJames Jerryl, MD.  The evaluation is for tremor.  The patient is a 81 y.o. right handed male with a history of tremor.  The tremor is primarily in the R and in the jaw.  The jaw does not bother him.  There is no family hx of tremor.    It is worse in the AM and worse with use.    Affected by caffeine:  Doesn't drink caffeine Affected by alcohol:  Yes, improves Affected by stress:  yes Affected by fatigue:  yes Spills soup if on spoon:  yes Spills spoonful of peas:  yes Spills glass of liquid if full:  yes if doesn't use both hands Affects ADL's (tying shoes, brushing teeth, etc): minimal  He does have a hx of foot drop x 3 years.  He had lumbar laminectomy several years ago after developing b/l foot drop and the laminectomy greatly helped the pain but the foot drop has been static.  The R is worse than the L.  09/21/13 update:  The pt is f/u today regarding ET.  I started him on primidone last visit, in early October.  He initially did well but seemed to lose efficacy and by 10/30, he had contacted me and we increased it to 50 mg bid.  Again, on 08/16/13, it was increased to 100 mg in the AM and 50 mg in the PM.  Again, he did well for a few days but then seemed to lose efficacy.  He states it is not as bad as it was before the medication but not as good as when he initially started it.  He also now has a feeling like a "tiredness" comes over his eyes, as if he needs to close them.  He does not describe eyelid opening apraxia.  He has noted changes in the voice as well.  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  Occasionally, he will have momentary double vision, but states that he can "blink it away."  He did see the ophthalmologist since last visit and the examination was unremarkable, and  unchanged from 3 years ago.    12/13/13 update:  The patient is following up regarding his essential tremor talk to his cardiologist and he reported that it would be okay to change his beta blocker to Inderal LA, 60 mg.  The patient felt that the primidone caused his eyes to be "tired."  When he stopped the primidone, that seemed to get better but tremor got worse.  He is no longer having the tired feeling of the eyes.   He is exercising without a problem.  "I feel that I am 81 years old, I am not embarrassed by the tremor, I don't have to do things that require significant precision any longer."  He still has trouble pouring water from a glass.  Has trouble with signature and handwriting.  12/26/14 update:  The patient has a history of essential tremor.  He was last seen approximately 1 year ago.  Pt states that he is working out at the club 4-5 days per week.  His biggest issue is taking care of his wife; she is dealing with SE of chemo.  He is on Inderal LA, 60 mg daily.  He previously tried primidone but felt that  that caused "tired eyes."  He still has tremor and "I manage it.  It is about the same."  He doesn't want to raise the dosage.    Current/Previously tried tremor medications: n/a but on metoprolol for other reasons.  Current medications that may exacerbate tremor:  n/a  Outside reports reviewed: historical medical records.  Allergies  Allergen Reactions  . Atorvastatin     REACTION: leg pain  . Niacin   . Simvastatin     Current Outpatient Prescriptions on File Prior to Visit  Medication Sig Dispense Refill  . aspirin 81 MG tablet Take 81 mg by mouth daily.      [name] atorvastatin (LIPITOR) 40 MG tablet Take 1 tablet (40 mg total) by mouth daily. 90 tablet 3  . Cholecalciferol (VITAMIN D) 2000 UNITS CAPS Take 2,000 Units by mouth daily.    . Coenzyme Q10 (COQ10 PO) Take by mouth daily.    . hydrochlorothiazide (HYDRODIURIL) 25 MG tablet TAKE ONE-HALF TABLET BY MOUTH AS NEEDED 30  tablet 5  . irbesartan (AVAPRO) 150 MG tablet TAKE ONE TABLET BY MOUTH ONCE DAILY 90 tablet 3  . OMEGA 3 1200 MG CAPS Take 3 tablets by mouth once a day     . propranolol ER (INDERAL LA) 60 MG 24 hr capsule TAKE ONE CAPSULE BY MOUTH ONCE DAILY 30 capsule 0   No current facility-administered medications on file prior to visit.    Past Medical History  Diagnosis Date  . ALLERGIC RHINITIS 11/25/2007  . ANXIETY 11/25/2007  . COLONIC POLYPS, HX OF 11/25/2007  . CORONARY ARTERY DISEASE 11/25/2007  . Cough 01/04/2009  . HEMORRHOIDS, INTERNAL 11/06/2010  . HOARSENESS 01/04/2009  . HYPERLIPIDEMIA 11/25/2007  . HYPERTENSION 11/25/2007  . LOW BACK PAIN 11/25/2007  . PEPTIC ULCER DISEASE 11/25/2007  . Preventative health care 04/07/2011  . PSA, INCREASED 01/05/2009  . TREMOR, ESSENTIAL 11/25/2007  . Lumbar radiculopathy, chronic     with right foot drop  . Right foot drop   . Impaired glucose tolerance 04/21/2013  . Spasmodic dysphonia     Past Surgical History  Procedure Laterality Date  . S/p lumbar laminectomy and fusion    . Appendectomy    . Tonsillectomy    . Coronary artery bypass graft    . Back surgery      History   Social History  . Marital Status: Married    Spouse Name: N/A  . Number of Children: 3  . Years of Education: N/A   Occupational History  . Retired    Social History Main Topics  . Smoking status: Never Smoker   . Smokeless tobacco: Never Used  . Alcohol Use: Yes     Comment: Occ  . Drug Use: No  . Sexual Activity: Not on file   Other Topics Concern  . Not on file   Social History Narrative   Son is internist in [place]    Family Status  Relation Status Death Age  . Father Deceased 19    Colon Cancer  . Mother Deceased 64    Old Age  . Son Alive     3    Review of Systems A complete 10 system ROS was obtained and was negative apart from what is mentioned.   Objective:   VITALS:   Filed Vitals:   12/26/14 1244  BP: 134/78  Pulse: 76   Height:  (1.778 m)  Weight: 181 lb (82.101 kg)   Gen:  Appears stated age  and in NAD. HEENT:  Normocephalic, atraumatic. The mucous membranes are moist. The superficial temporal CV:  RRR Lungs:  CTAB Neck:  No bruits bilaterally  NEUROLOGICAL:  Orientation:  The patient is alert and oriented x 3.   Cranial nerves: There is good facial symmetry. [name] Speech is fluent and clear but there is mild spasmodic dysphonia with mild hoarseness of the voice. Soft palate rises symmetrically and there is no tongue deviation. Hearing is intact to conversational tone. Tone: Tone is good throughout. Sensation: Sensation is intact to light touch throughout. Coordination:  The patient has no dysdiadichokinesia or dysmetria. Motor: Strength is 5/5 in the bilateral upper and lower extremities with the exception of 0/5 ankle dorsiflexion b/l.  There is 0/5 strength in b/l EDB.  There is 3/5 ankle eversion on the L and 0/5 on the R.  Shoulder shrug is equal bilaterally.  There is no pronator drift.  There are no fasciculations noted. Gait and Station: The patient has a "marching gait" on the R due to foot drop.  MOVEMENT EXAM: Tremor:  There is  tremor in the UE, noted most significantly with action.  It is mild-mod on the R and none noted on the L today.   Archimedes spirals are better on the R (but still with tremor) and tremor is evident with the spirals on the L.  He has jaw tremor.     Assessment/Plan:   1.  Essential Tremor.  -This is somewhat asymmetric, but that is not unheard of.  He has no features of any other disease.  We talked about various treatments.  He is definitely not interested in DBS, and I don't disagree with him.  Primidone has been beneficial, but the patient felt he had "tired eyes" on the medication.  He doesn't think that the inderal LA has been as beneficial but he doesn't want to increase the dosage.  Refills given today.  He can certainly let me know if he changes his  mind. 2.  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 3.  F/u one year

## 2015-02-20 ENCOUNTER — Other Ambulatory Visit: Payer: Self-pay | Admitting: Internal Medicine

## 2015-03-09 ENCOUNTER — Encounter: Payer: Self-pay | Admitting: Internal Medicine

## 2015-03-09 ENCOUNTER — Ambulatory Visit (INDEPENDENT_AMBULATORY_CARE_PROVIDER_SITE_OTHER): Payer: Medicare Other | Admitting: Internal Medicine

## 2015-03-09 VITALS — BP 128/78 | HR 79 | Temp 98.0°F | Ht 70.0 in | Wt 179.2 lb

## 2015-03-09 DIAGNOSIS — I10 Essential (primary) hypertension: Secondary | ICD-10-CM | POA: Diagnosis not present

## 2015-03-09 DIAGNOSIS — R21 Rash and other nonspecific skin eruption: Secondary | ICD-10-CM | POA: Diagnosis not present

## 2015-03-09 MED ORDER — HYDROCHLOROTHIAZIDE 25 MG PO TABS: 25.0000 mg | ORAL_TABLET | Freq: Every day | ORAL | Status: DC

## 2015-03-09 MED ORDER — KETOCONAZOLE 2 % EX CREA: 1.0000 "application " | TOPICAL_CREAM | Freq: Every day | CUTANEOUS | Status: DC

## 2015-03-09 NOTE — Progress Notes (Signed)
Pre visit review using our clinic review tool, if applicable. No additional management support is needed unless otherwise documented below in the visit note. 

## 2015-03-09 NOTE — Assessment & Plan Note (Signed)
C/w fungal rash/ringworm, for ketocon cream prn,  to f/u any worsening symptoms or concerns

## 2015-03-09 NOTE — Patient Instructions (Signed)
Please take all new medication as prescribed - the anti-fungal cream  Please continue all other medications as before, and refills have been done if requested.  Please have the pharmacy call with any other refills you may need.  Please continue your efforts at being more active, low cholesterol diet, and weight control.  Please keep your appointments with your specialists as you may have planned

## 2015-03-09 NOTE — Assessment & Plan Note (Signed)
stable overall by history and exam, recent data reviewed with pt, and pt to continue medical treatment as before,  to f/u any worsening symptoms or concerns BP Readings from Last 3 Encounters:  03/09/15 128/78  12/26/14 134/78  06/17/14 122/54

## 2015-03-09 NOTE — Progress Notes (Signed)
Subjective:    Patient ID: Ronald [name], male    DOB: 10/04/1929, 81 y.o.   MRN: [id number]  HPI  Here to f/u with erythem rash worsening to distal RLE painful, nonitchy but gradually increased in size for last 2 wks.  No fever, pain, drainage, other rash or similar in the past.  Not better with OTC cortizone or moisturizing cream or rubbing the rash off. Past Medical History  Diagnosis Date  . ALLERGIC RHINITIS 11/25/2007  . ANXIETY 11/25/2007  . COLONIC POLYPS, HX OF 11/25/2007  . CORONARY ARTERY DISEASE 11/25/2007  . Cough 01/04/2009  . HEMORRHOIDS, INTERNAL 11/06/2010  . HOARSENESS 01/04/2009  . HYPERLIPIDEMIA 11/25/2007  . HYPERTENSION 11/25/2007  . LOW BACK PAIN 11/25/2007  . PEPTIC ULCER DISEASE 11/25/2007  . Preventative health care 04/07/2011  . PSA, INCREASED 01/05/2009  . TREMOR, ESSENTIAL 11/25/2007  . Lumbar radiculopathy, chronic     with right foot drop  . Right foot drop   . Impaired glucose tolerance 04/21/2013  . Spasmodic dysphonia    Past Surgical History  Procedure Laterality Date  . S/p lumbar laminectomy and fusion    . Appendectomy    . Tonsillectomy    . Coronary artery bypass graft    . Back surgery      reports that he has never smoked. He has never used smokeless tobacco. He reports that he drinks alcohol. He reports that he does not use illicit drugs. family history includes Colon cancer in his father. Allergies  Allergen Reactions  . Atorvastatin     REACTION: leg pain  . Niacin   . Simvastatin    Current Outpatient Prescriptions on File Prior to Visit  Medication Sig Dispense Refill  . aspirin 81 MG tablet Take 81 mg by mouth daily.      [name] atorvastatin (LIPITOR) 40 MG tablet TAKE ONE TABLET BY MOUTH ONCE DAILY 90 tablet 0  . b complex vitamins tablet Take 1 tablet by mouth daily.    . Cholecalciferol (VITAMIN D) 2000 UNITS CAPS Take 2,000 Units by mouth daily.    . Coenzyme Q10 (COQ10 PO) Take by mouth daily.    . irbesartan (AVAPRO) 150 MG tablet  TAKE ONE TABLET BY MOUTH ONCE DAILY 90 tablet 3  . OMEGA 3 1200 MG CAPS Take 1 capsule by mouth 3 (three) times daily.     . propranolol ER (INDERAL LA) 60 MG 24 hr capsule Take 1 capsule (60 mg total) by mouth daily. 90 capsule 3   No current facility-administered medications on file prior to visit.   Review of Systems All otherwise neg per pt     Objective:   Physical Exam BP 128/78 mmHg  Pulse 79  Temp(Src) 98 F (36.7 C) (Oral)  Ht 5\' 10"  (1.778 m)  Wt 179 lb 4 oz (81.307 kg)  BMI 25.72 kg/m2  SpO2 94% VS noted,  Constitutional: Pt appears in no significant distress HENT: Head: NCAT.  Right Ear: External ear normal.  Left Ear: External ear normal.  Eyes: . Pupils are equal, round, and reactive to light. Conjunctivae and EOM are normal Neck: Normal range of motion. Neck supple.  Cardiovascular: Normal rate and regular rhythm.   Pulmonary/Chest: Effort normal and breath sounds without rales or wheezing.  Abd:  Soft, NT, ND, + BS Neurological: Pt is alert. Not confused , motor grossly intact Skin: Skin is warm. Serpiginous nontender non raised rash to medial RLE above ankle approx 3 cm area, no draingae,  no LE edema Psychiatric: Pt behavior is normal. No agitation.     Assessment & Plan:

## 2015-04-04 ENCOUNTER — Telehealth: Payer: Self-pay | Admitting: Internal Medicine

## 2015-04-04 MED ORDER — KETOCONAZOLE 2 % EX CREA: 1.0000 "application " | TOPICAL_CREAM | Freq: Every day | CUTANEOUS | Status: DC

## 2015-04-04 NOTE — Telephone Encounter (Signed)
Patient requesting a refill for ketoconazole (NIZORAL) 2 % cream [[id number] Pharmacy is [facility] on [address]

## 2015-05-04 ENCOUNTER — Other Ambulatory Visit (INDEPENDENT_AMBULATORY_CARE_PROVIDER_SITE_OTHER): Payer: Medicare Other

## 2015-05-04 ENCOUNTER — Encounter: Payer: Self-pay | Admitting: Internal Medicine

## 2015-05-04 ENCOUNTER — Ambulatory Visit (INDEPENDENT_AMBULATORY_CARE_PROVIDER_SITE_OTHER): Payer: Medicare Other | Admitting: Internal Medicine

## 2015-05-04 VITALS — BP 130/70 | HR 74 | Temp 98.7°F | Ht 70.0 in | Wt 181.0 lb

## 2015-05-04 DIAGNOSIS — E785 Hyperlipidemia, unspecified: Secondary | ICD-10-CM

## 2015-05-04 DIAGNOSIS — R7302 Impaired glucose tolerance (oral): Secondary | ICD-10-CM

## 2015-05-04 DIAGNOSIS — Z0000 Encounter for general adult medical examination without abnormal findings: Secondary | ICD-10-CM | POA: Diagnosis not present

## 2015-05-04 DIAGNOSIS — I10 Essential (primary) hypertension: Secondary | ICD-10-CM

## 2015-05-04 LAB — BASIC METABOLIC PANEL
BUN: 19 mg/dL (ref 6–23)
CHLORIDE: 102 meq/L (ref 96–112)
CO2: 32 meq/L (ref 19–32)
CREATININE: 1.09 mg/dL (ref 0.40–1.50)
Calcium: 9.2 mg/dL (ref 8.4–10.5)
GFR: 68.58 mL/min (ref >60.00)
Glucose, Bld: 105 mg/dL — ABNORMAL HIGH (ref 70–99)
POTASSIUM: 4.8 meq/L (ref 3.5–5.1)
Sodium: 137 mEq/L (ref 135–145)

## 2015-05-04 LAB — HEPATIC FUNCTION PANEL
ALK PHOS: 65 U/L (ref 39–117)
ALT: 19 U/L (ref 0–53)
AST: 24 U/L (ref 0–37)
Albumin: 3.9 g/dL (ref 3.5–5.2)
BILIRUBIN DIRECT: 0.3 mg/dL (ref 0.0–0.3)
BILIRUBIN TOTAL: 1.4 mg/dL — AB (ref 0.2–1.2)
TOTAL PROTEIN: 6.5 g/dL (ref 6.0–8.3)

## 2015-05-04 LAB — URINALYSIS, ROUTINE W REFLEX MICROSCOPIC
Bilirubin Urine: NEGATIVE
Hgb urine dipstick: NEGATIVE
KETONES UR: NEGATIVE
Leukocytes, UA: NEGATIVE
Nitrite: NEGATIVE
PH: 6 (ref 5.0–8.0)
RBC / HPF: NONE SEEN (ref 0–?)
SPECIFIC GRAVITY, URINE: 1.01 (ref 1.000–1.030)
Total Protein, Urine: NEGATIVE
URINE GLUCOSE: NEGATIVE
UROBILINOGEN UA: 1 (ref 0.0–1.0)

## 2015-05-04 LAB — LIPID PANEL
CHOLESTEROL: 135 mg/dL (ref 0–200)
HDL: 58.2 mg/dL (ref >39.00)
LDL Cholesterol: 64 mg/dL (ref 0–99)
NonHDL: 77.04
Total CHOL/HDL Ratio: 2
Triglycerides: 65 mg/dL (ref 0.0–149.0)
VLDL: 13 mg/dL (ref 0.0–40.0)

## 2015-05-04 LAB — CBC WITH DIFFERENTIAL/PLATELET
BASOS ABS: 0 10*3/uL (ref 0.0–0.1)
BASOS PCT: 0.4 % (ref 0.0–3.0)
EOS ABS: 0.2 10*3/uL (ref 0.0–0.7)
Eosinophils Relative: 4.6 % (ref 0.0–5.0)
HCT: 38.2 % — ABNORMAL LOW (ref 39.0–52.0)
Hemoglobin: 12.9 g/dL — ABNORMAL LOW (ref 13.0–17.0)
LYMPHS ABS: 0.9 10*3/uL (ref 0.7–4.0)
LYMPHS PCT: 17.2 % (ref 12.0–46.0)
MCHC: 33.7 g/dL (ref 30.0–36.0)
MCV: 107.4 fl — ABNORMAL HIGH (ref 78.0–100.0)
MONO ABS: 0.7 10*3/uL (ref 0.1–1.0)
Monocytes Relative: 14.7 % — ABNORMAL HIGH (ref 3.0–12.0)
NEUTROS ABS: 3.1 10*3/uL (ref 1.4–7.7)
NEUTROS PCT: 63.1 % (ref 43.0–77.0)
PLATELETS: 232 10*3/uL (ref 150.0–400.0)
RBC: 3.55 Mil/uL — ABNORMAL LOW (ref 4.22–5.81)
RDW: 14.4 % (ref 11.5–15.5)
WBC: 5 10*3/uL (ref 4.0–10.5)

## 2015-05-04 LAB — TSH: TSH: 2.86 u[IU]/mL (ref 0.35–4.50)

## 2015-05-04 LAB — HEMOGLOBIN A1C: HEMOGLOBIN A1C: 5.8 % (ref 4.6–6.5)

## 2015-05-04 MED ORDER — KETOCONAZOLE 2 % EX CREA: 1.0000 "application " | TOPICAL_CREAM | Freq: Every day | CUTANEOUS | Status: DC

## 2015-05-04 NOTE — Progress Notes (Signed)
Subjective:    Patient ID: Ronald [name], male    DOB: 07-19-1930, 81 y.o.   MRN: [id number]  HPI  Here for wellness and f/u;  Overall doing ok;  Pt denies Chest pain, worsening SOB, DOE, wheezing, orthopnea, PND, worsening LE edema, palpitations, dizziness or syncope.  Pt denies neurological change such as new headache, facial or extremity weakness.  Pt denies polydipsia, polyuria, or low sugar symptoms. Pt states overall good compliance with treatment and medications, good tolerability, and has been trying to follow appropriate diet.  Pt denies worsening depressive symptoms, suicidal ideation or panic. No fever, night sweats, wt loss, loss of appetite, or other constitutional symptoms.  Pt states good ability with ADL's, has low fall risk, home safety reviewed and adequate, no other significant changes in hearing or vision, and active with exercise fo rhis age, going to gym daily.  More stress recently with wife with cancer related chemo effects/pleurex  Did have a skin tear to left post arm 2 mo ago, very slow to heal, only now closed up it seems, no s/s infection. Sees Dr nishan/card and [name]/neurology; diuretic per card is helping Past Medical History  Diagnosis Date  . ALLERGIC RHINITIS 11/25/2007  . ANXIETY 11/25/2007  . COLONIC POLYPS, HX OF 11/25/2007  . CORONARY ARTERY DISEASE 11/25/2007  . Cough 01/04/2009  . HEMORRHOIDS, INTERNAL 11/06/2010  . HOARSENESS 01/04/2009  . HYPERLIPIDEMIA 11/25/2007  . HYPERTENSION 11/25/2007  . LOW BACK PAIN 11/25/2007  . PEPTIC ULCER DISEASE 11/25/2007  . Preventative health care 04/07/2011  . PSA, INCREASED 01/05/2009  . TREMOR, ESSENTIAL 11/25/2007  . Lumbar radiculopathy, chronic     with right foot drop  . Right foot drop   . Impaired glucose tolerance 04/21/2013  . Spasmodic dysphonia    Past Surgical History  Procedure Laterality Date  . S/p lumbar laminectomy and fusion    . Appendectomy    . Tonsillectomy    . Coronary artery bypass graft    .  Back surgery      reports that he has never smoked. He has never used smokeless tobacco. He reports that he drinks alcohol. He reports that he does not use illicit drugs. family history includes Colon cancer in his father. Allergies  Allergen Reactions  . Atorvastatin     REACTION: leg pain  . Niacin   . Simvastatin    Current Outpatient Prescriptions on File Prior to Visit  Medication Sig Dispense Refill  . aspirin 81 MG tablet Take 81 mg by mouth daily.      [name] atorvastatin (LIPITOR) 40 MG tablet TAKE ONE TABLET BY MOUTH ONCE DAILY 90 tablet 0  . b complex vitamins tablet Take 1 tablet by mouth daily.    . Cholecalciferol (VITAMIN D) 2000 UNITS CAPS Take 2,000 Units by mouth daily.    . Coenzyme Q10 (COQ10 PO) Take by mouth daily.    . hydrochlorothiazide (HYDRODIURIL) 25 MG tablet Take 1 tablet (25 mg total) by mouth daily. 90 tablet 1  . irbesartan (AVAPRO) 150 MG tablet TAKE ONE TABLET BY MOUTH ONCE DAILY 90 tablet 3  . OMEGA 3 1200 MG CAPS Take 1 capsule by mouth 3 (three) times daily.     . propranolol ER (INDERAL LA) 60 MG 24 hr capsule Take 1 capsule (60 mg total) by mouth daily. 90 capsule 3   No current facility-administered medications on file prior to visit.    Review of Systems Constitutional: Negative for increased diaphoresis, other  activity, appetite or siginficant weight change other than noted HENT: Negative for worsening hearing loss, ear pain, facial swelling, mouth sores and neck stiffness.   Eyes: Negative for other worsening pain, redness or visual disturbance.  Respiratory: Negative for shortness of breath and wheezing  Cardiovascular: Negative for chest pain and palpitations.  Gastrointestinal: Negative for diarrhea, blood in stool, abdominal distention or other pain Genitourinary: Negative for hematuria, flank pain or change in urine volume.  Musculoskeletal: Negative for myalgias or other joint complaints.  Skin: Negative for color change and wound or  drainage.  Neurological: Negative for syncope and numbness. other than noted Hematological: Negative for adenopathy. or other swelling Psychiatric/Behavioral: Negative for hallucinations, SI, self-injury, decreased concentration or other worsening agitation.      Objective:   Physical Exam BP 130/70 mmHg  Pulse 74  Temp(Src) 98.7 F (37.1 C) (Oral)  Ht 5\' 10"  (1.778 m)  Wt 181 lb (82.101 kg)  BMI 25.97 kg/m2  SpO2 96% VS noted,  Constitutional: Pt is oriented to person, place, and time. Appears well-developed and well-nourished, in no significant distress Head: Normocephalic and atraumatic.  Right Ear: External ear normal.  Left Ear: External ear normal.  Nose: Nose normal.  Mouth/Throat: Oropharynx is clear and moist.  Eyes: Conjunctivae and EOM are normal. Pupils are equal, round, and reactive to light.  Neck: Normal range of motion. Neck supple. No JVD present. No tracheal deviation present or significant neck LA or mass Cardiovascular: Normal rate, regular rhythm, normal heart sounds and intact distal pulses.   Pulmonary/Chest: Effort normal and breath sounds without rales or wheezing  Abdominal: Soft. Bowel sounds are normal. NT. No HSM  Musculoskeletal: Normal range of motion. Exhibits no edema.  Lymphadenopathy:  Has no cervical adenopathy.  Neurological: Pt is alert and oriented to person, place, and time. Pt has normal reflexes. No cranial nerve deficit. Motor grossly intact, + right drop foot Skin: Skin is warm and dry. No rash noted.  Psychiatric:  Has normal mood and affect. Behavior is normal.      Assessment & Plan:

## 2015-05-04 NOTE — Addendum Note (Signed)
Addended by: [name] on: 05/04/2015 12:15 PM   Modules accepted: Kipp Brood

## 2015-05-04 NOTE — Progress Notes (Signed)
Pre visit review using our clinic review tool, if applicable. No additional management support is needed unless otherwise documented below in the visit note. 

## 2015-05-04 NOTE — Patient Instructions (Signed)

## 2015-05-04 NOTE — Assessment & Plan Note (Addendum)

## 2015-05-04 NOTE — Assessment & Plan Note (Signed)
stable overall by history and exam, recent data reviewed with pt, and pt to continue medical treatment as before,  to f/u any worsening symptoms or concerns Lab Results  Component Value Date   HGBA1C 6.2 04/28/2014   For f/u lab

## 2015-05-21 ENCOUNTER — Other Ambulatory Visit: Payer: Self-pay | Admitting: Internal Medicine

## 2015-07-03 ENCOUNTER — Other Ambulatory Visit: Payer: Self-pay | Admitting: Internal Medicine

## 2015-07-26 ENCOUNTER — Encounter: Payer: Self-pay | Admitting: *Deleted

## 2015-07-27 NOTE — Progress Notes (Signed)
Patient ID: Ronald BowlerJohn A [name], male   DOB: 10/08/1929, 81 y.o.   MRN: [id number]   The patient is 81 y.o.  and return for management of CAD. Previously seen by Dr Juanda ChanceBrodie  Primary is Dr Jonny RuizJohn   CABG 1998. His last catheterization was in 2003 at which time he had total occlusion of the vein graft to the right coronary and good LV function. Negative Myoview in 2008.  He's been doing quite well has had no recent chest pain shortness of breath or palpitations. 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   Has RLE edema that has improved on diuretic and mandibular tremor No other signs of parkinsons but no familial history of tremor Tried primadone but no help  Wife very sick with lung CA Able to take Tarceva though   ROS: Denies fever, malais, weight loss, blurry vision, decreased visual acuity, cough, sputum, SOB, hemoptysis, pleuritic pain, palpitaitons, heartburn, abdominal pain, melena, lower extremity edema, claudication, or rash.  All other systems reviewed and negative  General: Affect appropriate Healthy:  appears stated age HEENT: normal Neck supple with no adenopathy JVP normal no bruits no thyromegaly Lungs clear with no wheezing and good diaphragmatic motion Heart:  S1/S2 no murmur, no rub, gallop or click PMI normal Abdomen: benighn, BS positve, no tenderness, no AAA no bruit.  No HSM or HJR Distal pulses intact with no bruits No edema Neuro right facial and right UE tremor  Skin warm and dry No muscular weakness Gait somwhat antalgic    Current Outpatient Prescriptions  Medication Sig Dispense Refill  . aspirin 81 MG tablet Take 81 mg by mouth daily.      [name]. atorvastatin (LIPITOR) 40 MG tablet Take 1 tablet (40 mg total) by mouth daily at 6 PM. 90 tablet 3  . b complex vitamins tablet Take 1 tablet by mouth daily.    .  Cholecalciferol (VITAMIN D) 2000 UNITS CAPS Take 2,000 Units by mouth daily.    . Coenzyme Q10 (COQ10 PO) Take 1 tablet by mouth daily.     . hydrochlorothiazide (HYDRODIURIL) 25 MG tablet Take 1 tablet (25 mg total) by mouth daily. 90 tablet 1  . irbesartan (AVAPRO) 150 MG tablet TAKE ONE TABLET BY MOUTH ONCE DAILY 90 tablet 2  . ketoconazole (NIZORAL) 2 % cream Apply 1 application topically daily. 30 g 1  . OMEGA 3 1200 MG CAPS Take 1 capsule by mouth 3 (three) times daily.     . propranolol ER (INDERAL LA) 60 MG 24 hr capsule Take 1 capsule (60 mg total) by mouth daily. 90 capsule 3   No current facility-administered medications for this visit.    Allergies  Atorvastatin; Niacin; and Simvastatin  Electrocardiogram: SR RBBB LAD  ? Old IMI   Assessment and Plan  CAD/CABG: active working out at gym everyday no symptoms continue medical Rx HTN: Well controlled.  Continue current medications and low sodium Dash type diet.   Chol:   Cholesterol is at goal.  Continue current dose of statin and diet Rx.  No myalgias or side effects.  F/U  LFT's in 6 months. Lab Results  Component Value Date   LDLCALC 64 05/04/2015           Edema:  Resolved on low dose lasix related to neuropathy and venous disease not CHF  F/u with me in 6 months  Ronald [name]

## 2015-07-28 ENCOUNTER — Ambulatory Visit (INDEPENDENT_AMBULATORY_CARE_PROVIDER_SITE_OTHER): Payer: Medicare Other | Admitting: Cardiovascular Disease

## 2015-07-28 ENCOUNTER — Encounter: Payer: Self-pay | Admitting: Cardiovascular Disease

## 2015-07-28 VITALS — BP 124/60 | HR 79 | Ht 70.0 in | Wt 182.0 lb

## 2015-07-28 DIAGNOSIS — I10 Essential (primary) hypertension: Secondary | ICD-10-CM

## 2015-07-28 NOTE — Patient Instructions (Signed)

## 2015-07-31 DIAGNOSIS — Z0279 Encounter for issue of other medical certificate: Secondary | ICD-10-CM

## 2015-09-27 ENCOUNTER — Encounter: Payer: Self-pay | Admitting: Cardiology

## 2015-11-07 ENCOUNTER — Other Ambulatory Visit (INDEPENDENT_AMBULATORY_CARE_PROVIDER_SITE_OTHER): Payer: Medicare Other

## 2015-11-07 ENCOUNTER — Encounter: Payer: Self-pay | Admitting: Internal Medicine

## 2015-11-07 ENCOUNTER — Ambulatory Visit (INDEPENDENT_AMBULATORY_CARE_PROVIDER_SITE_OTHER): Payer: Medicare Other | Admitting: Internal Medicine

## 2015-11-07 VITALS — BP 140/82 | HR 76 | Temp 98.2°F | Resp 20 | Wt 185.0 lb

## 2015-11-07 DIAGNOSIS — R300 Dysuria: Secondary | ICD-10-CM

## 2015-11-07 DIAGNOSIS — I10 Essential (primary) hypertension: Secondary | ICD-10-CM

## 2015-11-07 DIAGNOSIS — Z0000 Encounter for general adult medical examination without abnormal findings: Secondary | ICD-10-CM

## 2015-11-07 DIAGNOSIS — R7302 Impaired glucose tolerance (oral): Secondary | ICD-10-CM | POA: Diagnosis not present

## 2015-11-07 LAB — LIPID PANEL
CHOL/HDL RATIO: 2
Cholesterol: 123 mg/dL (ref 0–200)
HDL: 58.1 mg/dL (ref >39.00)
LDL Cholesterol: 55 mg/dL (ref 0–99)
NONHDL: 64.67
TRIGLYCERIDES: 49 mg/dL (ref 0.0–149.0)
VLDL: 9.8 mg/dL (ref 0.0–40.0)

## 2015-11-07 LAB — BASIC METABOLIC PANEL
BUN: 16 mg/dL (ref 6–23)
CHLORIDE: 101 meq/L (ref 96–112)
CO2: 30 meq/L (ref 19–32)
CREATININE: 1.05 mg/dL (ref 0.40–1.50)
Calcium: 9.2 mg/dL (ref 8.4–10.5)
GFR: 71.51 mL/min (ref >60.00)
Glucose, Bld: 106 mg/dL — ABNORMAL HIGH (ref 70–99)
Potassium: 4.5 mEq/L (ref 3.5–5.1)
Sodium: 136 mEq/L (ref 135–145)

## 2015-11-07 LAB — CBC WITH DIFFERENTIAL/PLATELET
Basophils Absolute: 0 10*3/uL (ref 0.0–0.1)
Basophils Relative: 0.1 % (ref 0.0–3.0)
EOS ABS: 0.2 10*3/uL (ref 0.0–0.7)
Eosinophils Relative: 3.1 % (ref 0.0–5.0)
HCT: 36.9 % — ABNORMAL LOW (ref 39.0–52.0)
HEMOGLOBIN: 12.4 g/dL — AB (ref 13.0–17.0)
Lymphocytes Relative: 12.8 % (ref 12.0–46.0)
Lymphs Abs: 0.8 10*3/uL (ref 0.7–4.0)
MCHC: 33.6 g/dL (ref 30.0–36.0)
MCV: 106.1 fl — ABNORMAL HIGH (ref 78.0–100.0)
MONO ABS: 0.9 10*3/uL (ref 0.1–1.0)
Monocytes Relative: 13.8 % — ABNORMAL HIGH (ref 3.0–12.0)
NEUTROS PCT: 70.2 % (ref 43.0–77.0)
Neutro Abs: 4.3 10*3/uL (ref 1.4–7.7)
PLATELETS: 257 10*3/uL (ref 150.0–400.0)
RBC: 3.48 Mil/uL — ABNORMAL LOW (ref 4.22–5.81)
RDW: 14.2 % (ref 11.5–15.5)
WBC: 6.2 10*3/uL (ref 4.0–10.5)

## 2015-11-07 LAB — HEPATIC FUNCTION PANEL
ALBUMIN: 3.7 g/dL (ref 3.5–5.2)
ALT: 18 U/L (ref 0–53)
AST: 20 U/L (ref 0–37)
Alkaline Phosphatase: 59 U/L (ref 39–117)
Bilirubin, Direct: 0.2 mg/dL (ref 0.0–0.3)
TOTAL PROTEIN: 6.8 g/dL (ref 6.0–8.3)
Total Bilirubin: 1 mg/dL (ref 0.2–1.2)

## 2015-11-07 LAB — URINALYSIS, ROUTINE W REFLEX MICROSCOPIC
Bilirubin Urine: NEGATIVE
HGB URINE DIPSTICK: NEGATIVE
Ketones, ur: NEGATIVE
NITRITE: NEGATIVE
PH: 6 (ref 5.0–8.0)
RBC / HPF: NONE SEEN (ref 0–?)
SPECIFIC GRAVITY, URINE: 1.01 (ref 1.000–1.030)
TOTAL PROTEIN, URINE-UPE24: NEGATIVE
URINE GLUCOSE: NEGATIVE
Urobilinogen, UA: 0.2 (ref 0.0–1.0)

## 2015-11-07 LAB — HEMOGLOBIN A1C: Hgb A1c MFr Bld: 6.2 % (ref 4.6–6.5)

## 2015-11-07 LAB — TSH: TSH: 3.08 u[IU]/mL (ref 0.35–4.50)

## 2015-11-07 NOTE — Assessment & Plan Note (Signed)
Asymtp, stable overall by history and exam, recent data reviewed with pt, and pt to continue medical treatment as before,  to f/u any worsening symptoms or concerns Lab Results  Component Value Date   HGBA1C 5.8 05/04/2015

## 2015-11-07 NOTE — Assessment & Plan Note (Signed)
stable overall by history and exam, recent data reviewed with pt, and pt to continue medical treatment as before,  to f/u any worsening symptoms or concerns BP Readings from Last 3 Encounters:  11/07/15 140/82  07/28/15 124/60  05/04/15 130/70

## 2015-11-07 NOTE — Assessment & Plan Note (Signed)
I suspect related to recent flare of prostatism though cant r/o uti - for urine cx, currently afeb and exam with enlarged but NT prostate, consider antibiotx for abnormal urine studies or recurrent symptoms empiric tx

## 2015-11-07 NOTE — Patient Instructions (Signed)

## 2015-11-07 NOTE — Progress Notes (Signed)
Pre visit review using our clinic review tool, if applicable. No additional management support is needed unless otherwise documented below in the visit note. 

## 2015-11-07 NOTE — Assessment & Plan Note (Signed)

## 2015-11-07 NOTE — Progress Notes (Signed)
Subjective:    Patient ID: Ronald [name], male    DOB: 1929-10-30, 82 y.o.   MRN: [id number]  HPI   Here for wellness and f/u;  Overall doing ok;  Pt denies Chest pain, worsening SOB, DOE, wheezing, orthopnea, PND, worsening LE edema, palpitations, dizziness or syncope.  Pt denies neurological change such as new headache, facial or extremity weakness.  Pt denies polydipsia, polyuria, or low sugar symptoms. Pt states overall good compliance with treatment and medications, good tolerability, and has been trying to follow appropriate diet.  Pt denies worsening depressive symptoms, suicidal ideation or panic. No fever, night sweats, wt loss, loss of appetite, or other constitutional symptoms.  Pt states good ability with ADL's, has low fall risk, home safety reviewed and adequate, no other significant changes in hearing or vision, and only occasionally active with exercise, due to chronic foot drop, no longer plays golf, quite dissapointing to him now.  Also with c/o onset whole body trouble a "full body reaction"  on Saturday feb 18, marked ringing in the ears, discomfort around the eyes, burning with uination, uncontrollable bowel movements that he could not control, read in a book to try cramberry juice, sohas been doing this, dysuria seems less.  Wondering if he is dehydrated.  HCT use seemed to make the urination freq worse, was hard to start, hard to pass urine with the burning, and finally had to sit on the commode as he could not urinate standing.  Felt misery that day the worst, then somewhat better the next day.  Since then feels overall fully recovered though still has some tinnitus, but then states he has had this for about 2 yrs and just doesn't pay attention unless he is not busy concentrating on something. Has some mild hearing loss, does not believe worse.  Denies urinary symptoms such as fever, flank pain, hematuria or n/v, fever, chills.  Pt continues to have recurring LBP without change in  severity, bowel or bladder change, fever, wt loss,  worsening LE pain/numbness/weakness, gait change or falls - but no change overall.  Pt has refused ER on sat, wanted to wait until today.  Slept for over 12 hrs on sat night. Past Medical History  Diagnosis Date  . ALLERGIC RHINITIS 11/25/2007  . ANXIETY 11/25/2007  . COLONIC POLYPS, HX OF 11/25/2007  . CORONARY ARTERY DISEASE 11/25/2007  . Cough 01/04/2009  . HEMORRHOIDS, INTERNAL 11/06/2010  . HOARSENESS 01/04/2009  . HYPERLIPIDEMIA 11/25/2007  . HYPERTENSION 11/25/2007  . LOW BACK PAIN 11/25/2007  . PEPTIC ULCER DISEASE 11/25/2007  . Preventative health care 04/07/2011  . PSA, INCREASED 01/05/2009  . TREMOR, ESSENTIAL 11/25/2007  . Lumbar radiculopathy, chronic     with right foot drop  . Right foot drop   . Impaired glucose tolerance 04/21/2013  . Spasmodic dysphonia    Past Surgical History  Procedure Laterality Date  . S/p lumbar laminectomy and fusion    . Appendectomy    . Tonsillectomy    . Coronary artery bypass graft    . Back surgery      reports that he has never smoked. He has never used smokeless tobacco. He reports that he drinks alcohol. He reports that he does not use illicit drugs. family history includes Colon cancer in his father. Allergies  Allergen Reactions  . Atorvastatin     REACTION: leg pain  . Niacin     Unknown per pt  . Simvastatin     Unknown per pt  Current Outpatient Prescriptions on File Prior to Visit  Medication Sig Dispense Refill  . aspirin 81 MG tablet Take 81 mg by mouth daily.      [name] atorvastatin (LIPITOR) 40 MG tablet Take 1 tablet (40 mg total) by mouth daily at 6 PM. 90 tablet 3  . b complex vitamins tablet Take 1 tablet by mouth daily.    . Cholecalciferol (VITAMIN D) 2000 UNITS CAPS Take 2,000 Units by mouth daily.    . Coenzyme Q10 (COQ10 PO) Take 1 tablet by mouth daily.     . hydrochlorothiazide (HYDRODIURIL) 25 MG tablet Take 1 tablet (25 mg total) by mouth daily. (Patient taking  differently: Take 25 mg by mouth as needed. ) 90 tablet 1  . irbesartan (AVAPRO) 150 MG tablet TAKE ONE TABLET BY MOUTH ONCE DAILY 90 tablet 2  . OMEGA 3 1200 MG CAPS Take 1 capsule by mouth 3 (three) times daily.     . propranolol ER (INDERAL LA) 60 MG 24 hr capsule Take 1 capsule (60 mg total) by mouth daily. 90 capsule 3   No current facility-administered medications on file prior to visit.   Review of Systems Constitutional: Negative for increased diaphoresis, other activity, appetite or siginficant weight change other than noted HENT: Negative for worsening hearing loss, ear pain, facial swelling, mouth sores and neck stiffness.   Eyes: Negative for other worsening pain, redness or visual disturbance.  Respiratory: Negative for shortness of breath and wheezing  Cardiovascular: Negative for chest pain and palpitations.  Gastrointestinal: Negative for diarrhea, blood in stool, abdominal distention or other pain Genitourinary: Negative for hematuria, flank pain or change in urine volume.  Musculoskeletal: Negative for myalgias or other joint complaints.  Skin: Negative for color change and wound or drainage.  Neurological: Negative for syncope and numbness. other than noted Hematological: Negative for adenopathy. or other swelling Psychiatric/Behavioral: Negative for hallucinations, SI, self-injury, decreased concentration or other worsening agitation.      Objective:   Physical Exam BP 140/82 mmHg  Pulse 76  Temp(Src) 98.2 F (36.8 C) (Oral)  Resp 20  Wt 185 lb (83.915 kg)  SpO2 96% VS noted,  Constitutional: Pt is oriented to person, place, and time. Appears well-developed and well-nourished, in no significant distress Head: Normocephalic and atraumatic.  Right Ear: External ear normal.  Left Ear: External ear normal.  Nose: Nose normal.  Mouth/Throat: Oropharynx is clear and moist.  Eyes: Conjunctivae and EOM are normal. Pupils are equal, round, and reactive to light.    Neck: Normal range of motion. Neck supple. No JVD present. No tracheal deviation present or significant neck LA or mass Cardiovascular: Normal rate, regular rhythm, normal heart sounds and intact distal pulses.   Pulmonary/Chest: Effort normal and breath sounds without rales or wheezing  Abdominal: Soft. Bowel sounds are normal. NT. No HSM  Musculoskeletal: Normal range of motion. Exhibits no edema.  Lymphadenopathy:  Has no cervical adenopathy.  Neurological: Pt is alert and oriented to person, place, and time. Pt has normal reflexes. No cranial nerve deficit. Motor grossly intact, has foot drop, o/w not done in detail, has tremor chin and hand Skin: Skin is warm and dry. No rash noted.  Psychiatric:  Has nervous fearful mood and affect. Behavior is normal.  DRE: heme neg, prostate 1-2+, nontender, no nodule    Assessment & Plan:

## 2015-11-09 ENCOUNTER — Other Ambulatory Visit: Payer: Self-pay | Admitting: Internal Medicine

## 2015-11-09 LAB — URINE CULTURE: Colony Count: 75000

## 2015-11-09 MED ORDER — NITROFURANTOIN MONOHYD MACRO 100 MG PO CAPS: 100.0000 mg | ORAL_CAPSULE | Freq: Two times a day (BID) | ORAL | Status: DC

## 2015-12-26 ENCOUNTER — Ambulatory Visit (INDEPENDENT_AMBULATORY_CARE_PROVIDER_SITE_OTHER): Payer: Medicare Other | Admitting: Neurology

## 2015-12-26 ENCOUNTER — Encounter: Payer: Self-pay | Admitting: Neurology

## 2015-12-26 VITALS — BP 130/62 | HR 77 | Ht 70.0 in | Wt 181.0 lb

## 2015-12-26 DIAGNOSIS — G250 Essential tremor: Secondary | ICD-10-CM | POA: Diagnosis not present

## 2015-12-26 DIAGNOSIS — J383 Other diseases of vocal cords: Secondary | ICD-10-CM | POA: Diagnosis not present

## 2015-12-26 MED ORDER — PROPRANOLOL HCL ER 60 MG PO CP24: 60.0000 mg | ORAL_CAPSULE | Freq: Every day | ORAL | Status: DC

## 2015-12-26 NOTE — Progress Notes (Signed)
Subjective:   Ronald [name] was seen in consultation in the movement disorder clinic at the request of Dr. Eden Emms.  His PCP is Oliver Barre, MD.  The evaluation is for tremor.  The patient is a 82 y.o. right handed male with a history of tremor.  The tremor is primarily in the R and in the jaw.  The jaw does not bother him.  There is no family hx of tremor.    It is worse in the AM and worse with use.    Affected by caffeine:  Doesn't drink caffeine Affected by alcohol:  Yes, improves Affected by stress:  yes Affected by fatigue:  yes Spills soup if on spoon:  yes Spills spoonful of peas:  yes Spills glass of liquid if full:  yes if doesn't use both hands Affects ADL's (tying shoes, brushing teeth, etc): minimal  He does have a hx of foot drop x 3 years.  He had lumbar laminectomy several years ago after developing b/l foot drop and the laminectomy greatly helped the pain but the foot drop has been static.  The R is worse than the L.  09/21/13 update:  The pt is f/u today regarding ET.  I started him on primidone last visit, in early October.  He initially did well but seemed to lose efficacy and by 10/30, he had contacted me and we increased it to 50 mg bid.  Again, on 08/16/13, it was increased to 100 mg in the AM and 50 mg in the PM.  Again, he did well for a few days but then seemed to lose efficacy.  He states it is not as bad as it was before the medication but not as good as when he initially started it.  He also now has a feeling like a "tiredness" comes over his eyes, as if he needs to close them.  He does not describe eyelid opening apraxia.  He has noted changes in the voice as well.  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  Occasionally, he will have momentary double vision, but states that he can "blink it away."  He did see the ophthalmologist since last visit and the examination was unremarkable, and  unchanged from 3 years ago.    12/13/13 update:  The patient is following up regarding his essential tremor talk to his cardiologist and he reported that it would be okay to change his beta blocker to Inderal LA, 60 mg.  The patient felt that the primidone caused his eyes to be "tired."  When he stopped the primidone, that seemed to get better but tremor got worse.  He is no longer having the tired feeling of the eyes.   He is exercising without a problem.  "I feel that I am 82 years old, I am not embarrassed by the tremor, I don't have to do things that require significant precision any longer."  He still has trouble pouring water from a glass.  Has trouble with signature and handwriting.  12/26/14 update:  The patient has a history of essential tremor.  He was last seen approximately 1 year ago.  Pt states that he is working out at the club 4-5 days per week.  His biggest issue is taking care of his wife; she is dealing with SE of chemo.  He is on Inderal LA, 60 mg daily.  He previously tried primidone but felt that  that caused "tired eyes."  He still has tremor and "I manage it.  It is about the same."  He doesn't want to raise the dosage.    12/26/15 update:  Patient follows up today.  I have reviewed prior records made available to me.  I have not seen him in about a year.  He has essential tremor and is on Inderal LA, 60 mg daily.  This only partially controls tremor, but he has not wanted to increase the medication.  He feels that he has been stable.   R hand is the biggest problem and "I don't pay any attention to it."   He felt primidone caused him to have side effects which included "tired eyes."  Wife has been sick with lung cancer and that has been biggest problem.    Current/Previously tried tremor medications: n/a but on metoprolol for other reasons.  Current medications that may exacerbate tremor:  n/a  Outside reports reviewed: historical medical records.  Allergies  Allergen Reactions   . Atorvastatin     REACTION: leg pain  . Niacin     Unknown per pt  . Simvastatin     Unknown per pt    Current Outpatient Prescriptions on File Prior to Visit  Medication Sig Dispense Refill  . aspirin 81 MG tablet Take 81 mg by mouth daily.      [name] atorvastatin (LIPITOR) 40 MG tablet Take 1 tablet (40 mg total) by mouth daily at 6 PM. 90 tablet 3  . b complex vitamins tablet Take 1 tablet by mouth daily.    . Cholecalciferol (VITAMIN D) 2000 UNITS CAPS Take 2,000 Units by mouth daily.    . Coenzyme Q10 (COQ10 PO) Take 1 tablet by mouth daily.     . hydrochlorothiazide (HYDRODIURIL) 25 MG tablet Take 1 tablet (25 mg total) by mouth daily. (Patient taking differently: Take 25 mg by mouth as needed. ) 90 tablet 1  . irbesartan (AVAPRO) 150 MG tablet TAKE ONE TABLET BY MOUTH ONCE DAILY 90 tablet 2  . OMEGA 3 1200 MG CAPS Take 1 capsule by mouth 3 (three) times daily.     . propranolol ER (INDERAL LA) 60 MG 24 hr capsule Take 1 capsule (60 mg total) by mouth daily. 90 capsule 3   No current facility-administered medications on file prior to visit.    Past Medical History  Diagnosis Date  . ALLERGIC RHINITIS 11/25/2007  . ANXIETY 11/25/2007  . COLONIC POLYPS, HX OF 11/25/2007  . CORONARY ARTERY DISEASE 11/25/2007  . Cough 01/04/2009  . HEMORRHOIDS, INTERNAL 11/06/2010  . HOARSENESS 01/04/2009  . HYPERLIPIDEMIA 11/25/2007  . HYPERTENSION 11/25/2007  . LOW BACK PAIN 11/25/2007  . PEPTIC ULCER DISEASE 11/25/2007  . Preventative health care 04/07/2011  . PSA, INCREASED 01/05/2009  . TREMOR, ESSENTIAL 11/25/2007  . Lumbar radiculopathy, chronic     with right foot drop  . Right foot drop   . Impaired glucose tolerance 04/21/2013  . Spasmodic dysphonia     Past Surgical History  Procedure Laterality Date  . S/p lumbar laminectomy and fusion    . Appendectomy    . Tonsillectomy    . Coronary artery bypass graft    . Back surgery      Social History   Social History  . Marital Status:  Married    Spouse Name: N/A  . Number of Children: 3  . Years of Education: N/A   Occupational History  . Retired    Chief Executive Officer  History Main Topics  . Smoking status: Never Smoker   . Smokeless tobacco: Never Used  . Alcohol Use: Yes     Comment: Occ  . Drug Use: No  . Sexual Activity: Not on file   Other Topics Concern  . Not on file   Social History Narrative   Son is internist in [place]    Family Status  Relation Status Death Age  . Father Deceased 2967    Colon Cancer  . Mother Deceased [id number]    Old Age  . Son Alive     3    Review of Systems A complete 10 system ROS was obtained and was negative apart from what is mentioned.   Objective:   VITALS:   Filed Vitals:   12/26/15 1112  BP: 130/62  Pulse: 77  Height: 5\' 10"  (1.778 m)  Weight: 181 lb (82.101 kg)   Gen:  Appears stated age and in NAD. HEENT:  Normocephalic, atraumatic. The mucous membranes are moist. The superficial temporal CV:  RRR Lungs:  CTAB Neck:  No bruits bilaterally  NEUROLOGICAL:  Orientation:  The patient is alert and oriented x 3.   Cranial nerves: There is good facial symmetry. [name]. Speech is fluent and clear but there is mild spasmodic dysphonia with mild hoarseness of the voice. Soft palate rises symmetrically and there is no tongue deviation. Hearing is intact to conversational tone. Tone: Tone is good throughout. Sensation: Sensation is intact to light touch throughout. Coordination:  The patient has no dysdiadichokinesia or dysmetria. Motor: Strength is 5/5 in the bilateral upper and lower extremities with the exception of 0/5 ankle dorsiflexion b/l.  There is 0/5 strength in b/l EDB.  There is 3/5 ankle eversion on the L and 0/5 on the R.  Shoulder shrug is equal bilaterally.  There is no pronator drift.  There are no fasciculations noted. Gait and Station: The patient has a "marching gait" on the R due to foot drop.  MOVEMENT EXAM: Tremor:  There is tremor in the UE, noted most  significantly with action.  It is mild-mod on the R and none noted on the L today.   Archimedes spirals are better on the R (but still with tremor) and tremor is evident with the spirals on the L.  He has jaw tremor.     Assessment/Plan:   1.  Essential Tremor.  -This is somewhat asymmetric, but that is not unheard of.  He has no features of any other disease.  We talked about various treatments.  He is definitely not interested in DBS, and I don't disagree with him.  Primidone has been beneficial, but the patient felt he had "tired eyes" on the medication.  He doesn't think that the inderal LA has been as beneficial but he doesn't want to increase the dosage.  Refills given today.  He can certainly let me know if he changes his mind. 2.  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 3.  F/u one year

## 2016-01-23 NOTE — Progress Notes (Signed)
Patient ID: Ronald [name], male   DOB: [birth year], 82 y.o.   MRN: [id number]  The patient is 82 y.o.  and return for management of CAD. Previously seen by Dr Juanda Chance  Primary is Dr Ronald [name]   CABG 1998. His last catheterization was in 2003 at which time he had total occlusion of the vein graft to the right coronary and good LV function. Negative Myoview in 2008.  He's been doing quite well has had no recent chest pain shortness of breath or palpitations. 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  All other systems reviewed and negative  General: Affect appropriate Healthy:  appears stated age HEENT: normal Neck supple with no adenopathy JVP normal no bruits no thyromegaly Lungs clear with no wheezing and good diaphragmatic motion Heart:  S1/S2 no murmur, no rub, gallop or click PMI normal Abdomen: benighn, BS positve, no tenderness, no AAA no bruit.  No HSM or HJR Distal pulses intact with no bruits No edema Neuro right facial and right UE tremor  Skin warm and dry No muscular weakness Gait somwhat antalgic    Current Outpatient Prescriptions  Medication Sig Dispense Refill  . aspirin 81 MG tablet Take 81 mg by mouth daily.      [name] atorvastatin (LIPITOR) 40 MG tablet Take 1 tablet (40 mg total) by mouth daily at  6 PM. 90 tablet 3  . b complex vitamins tablet Take 1 tablet by mouth daily.    . Cholecalciferol (VITAMIN D) 2000 UNITS CAPS Take 2,000 Units by mouth daily.    . Coenzyme Q10 (COQ10 PO) Take 1 tablet by mouth daily.     . hydrochlorothiazide (HYDRODIURIL) 25 MG tablet Take 1 tablet (25 mg total) by mouth daily. (Patient taking differently: Take 25 mg by mouth as needed. ) 90 tablet 1  . irbesartan (AVAPRO) 150 MG tablet TAKE ONE TABLET BY MOUTH ONCE DAILY 90 tablet 2  . OMEGA 3 1200 MG CAPS Take 1 capsule by mouth 3 (three) times daily.     . propranolol ER (INDERAL LA) 60 MG 24 hr capsule Take 1 capsule (60 mg total) by mouth daily. 90 capsule 3   No current facility-administered medications for this visit.    Allergies  Atorvastatin; Niacin; and Simvastatin  Electrocardiogram:  07/2015  SR RBBB LAD  ? Old IMI   Assessment and Plan  CAD/CABG:  CABG 98 cath 2003 occluded RCA graft.  Myovue 2008 non ischemic Active working out at gym everyday no symptoms continue medical Rx HTN: Well controlled.  Continue current medications and low sodium Dash type diet.   Chol:   Cholesterol is at goal.  Continue current dose of statin and diet Rx.  No myalgias or side effects.  F/U  LFT's in 6 months. Lab Results  Component Value  Date   LDLCALC 55 11/07/2015           Edema:  Resolved on low dose lasix related to neuropathy and venous disease not CHF   F/u with me in 6 months  Dillon BjorkPeter Prisilla Kocsis Yola Paradiso

## 2016-01-24 ENCOUNTER — Encounter: Payer: Self-pay | Admitting: Cardiovascular Disease

## 2016-01-24 ENCOUNTER — Ambulatory Visit (INDEPENDENT_AMBULATORY_CARE_PROVIDER_SITE_OTHER): Payer: Medicare Other | Admitting: Cardiovascular Disease

## 2016-01-24 VITALS — BP 128/60 | HR 96 | Ht 70.0 in | Wt 182.0 lb

## 2016-01-24 DIAGNOSIS — I10 Essential (primary) hypertension: Secondary | ICD-10-CM

## 2016-01-24 MED ORDER — HYDROCHLOROTHIAZIDE 25 MG PO TABS: 12.5000 mg | ORAL_TABLET | Freq: Every day | ORAL | Status: DC | PRN

## 2016-01-24 NOTE — Patient Instructions (Signed)
Medication Instructions:  Your physician has recommended you make the following change in your medication:  1-Take Hydrochlorothiazide 12.5 mg (1/2 tablet) by mouth daily as needed for swelling  Labwork: NONE  Testing/Procedures: NONE  Follow-Up: Your physician wants you to follow-up in: 12 months with Dr. Eden EmmsNishan. You will receive a reminder letter in the mail two months in advance. If you don't receive a letter, please call our office to schedule the follow-up appointment.   If you need a refill on your cardiac medications before your next appointment, please call your pharmacy.

## 2016-02-27 DIAGNOSIS — M5136 Other intervertebral disc degeneration, lumbar region: Secondary | ICD-10-CM | POA: Diagnosis not present

## 2016-02-27 DIAGNOSIS — M549 Dorsalgia, unspecified: Secondary | ICD-10-CM | POA: Diagnosis not present

## 2016-02-27 DIAGNOSIS — M4155 Other secondary scoliosis, thoracolumbar region: Secondary | ICD-10-CM | POA: Diagnosis not present

## 2016-02-27 DIAGNOSIS — M546 Pain in thoracic spine: Secondary | ICD-10-CM | POA: Diagnosis not present

## 2016-02-27 DIAGNOSIS — M47816 Spondylosis without myelopathy or radiculopathy, lumbar region: Secondary | ICD-10-CM | POA: Diagnosis not present

## 2016-03-29 ENCOUNTER — Other Ambulatory Visit: Payer: Self-pay | Admitting: Internal Medicine

## 2016-05-07 ENCOUNTER — Encounter: Payer: Self-pay | Admitting: Internal Medicine

## 2016-05-07 ENCOUNTER — Ambulatory Visit (INDEPENDENT_AMBULATORY_CARE_PROVIDER_SITE_OTHER): Payer: Medicare Other | Admitting: Internal Medicine

## 2016-05-07 VITALS — BP 136/82 | HR 66 | Temp 98.1°F | Resp 20 | Wt 183.0 lb

## 2016-05-07 DIAGNOSIS — M5416 Radiculopathy, lumbar region: Secondary | ICD-10-CM | POA: Diagnosis not present

## 2016-05-07 DIAGNOSIS — R7302 Impaired glucose tolerance (oral): Secondary | ICD-10-CM | POA: Diagnosis not present

## 2016-05-07 DIAGNOSIS — E785 Hyperlipidemia, unspecified: Secondary | ICD-10-CM | POA: Diagnosis not present

## 2016-05-07 DIAGNOSIS — I10 Essential (primary) hypertension: Secondary | ICD-10-CM | POA: Diagnosis not present

## 2016-05-07 DIAGNOSIS — Z0001 Encounter for general adult medical examination with abnormal findings: Secondary | ICD-10-CM

## 2016-05-07 DIAGNOSIS — Z23 Encounter for immunization: Secondary | ICD-10-CM

## 2016-05-07 MED ORDER — TRAMADOL HCL ER 100 MG PO TB24: 100.0000 mg | ORAL_TABLET | Freq: Every day | ORAL | 0 refills | Status: DC

## 2016-05-07 NOTE — Progress Notes (Signed)
Subjective:    Patient ID: Ronald [name], male    DOB: 11/05/1929, 82 y.o.   MRN: [id number]  HPI   Here to f/u; overall doing ok,  Pt denies chest pain, increasing sob or doe, wheezing, orthopnea, PND, increased LE swelling, palpitations, dizziness or syncope.  Pt denies new neurological symptoms such as new headache, or facial or extremity weakness or numbness.  Pt denies polydipsia, polyuria, or low sugar episode.   Pt denies new neurological symptoms such as new headache, or facial or extremity weakness or numbness.   Pt states overall good compliance with meds, mostly trying to follow appropriate diet, with wt overall stable,  HCT cont's to work well for edema and fluid wt.   Wt Readings from Last 3 Encounters:  05/07/16 183 lb (83 kg)  01/24/16 182 lb (82.6 kg)  12/26/15 181 lb (82.1 kg)  Pt continues to have recurring LBP chronic without change in severity, bowel or bladder change, fever, wt loss,  worsening LE pain/numbness/weakness, gait change or falls.  Has known lumbar disease, would take 3 surguries to work ion this, and pt declines.  Still have right foot drop, goes to the gym reguarly, works on ellipitical.  Has chronic head tremor.  Overall knows what its like ot get older, as wife unable for sex as has lung cancer,, has ongoing and gradually worsening ability to do what he wants in general, has aches and pain, vision and hearing less than before.  Does not feel he is suffering per se, and not asking for pain med, but would just like to go to family get togethers and get through it better, walks with cane as well.  Really feels overall ok, just worries something wil happen to him, such that his wife will not have good support, without him  Denies urinary symptoms such as dysuria, frequency, urgency, flank pain, hematuria or n/v, fever, chills. Past Medical History:  Diagnosis Date  . ALLERGIC RHINITIS 11/25/2007  . ANXIETY 11/25/2007  . COLONIC POLYPS, HX OF 11/25/2007  . CORONARY ARTERY  DISEASE 11/25/2007  . Cough 01/04/2009  . HEMORRHOIDS, INTERNAL 11/06/2010  . HOARSENESS 01/04/2009  . HYPERLIPIDEMIA 11/25/2007  . HYPERTENSION 11/25/2007  . Impaired glucose tolerance 04/21/2013  . LOW BACK PAIN 11/25/2007  . Lumbar radiculopathy, chronic    with right foot drop  . PEPTIC ULCER DISEASE 11/25/2007  . Preventative health care 04/07/2011  . PSA, INCREASED 01/05/2009  . Right foot drop   . Spasmodic dysphonia   . TREMOR, ESSENTIAL 11/25/2007   Past Surgical History:  Procedure Laterality Date  . APPENDECTOMY    . BACK SURGERY    . CORONARY ARTERY BYPASS GRAFT    . s/p lumbar laminectomy and fusion    . TONSILLECTOMY      reports that he has never smoked. He has never used smokeless tobacco. He reports that he drinks alcohol. He reports that he does not use drugs. family history includes Colon cancer in his father. No Known Allergies Current Outpatient Prescriptions on File Prior to Visit  Medication Sig Dispense Refill  . aspirin 81 MG tablet Take 81 mg by mouth daily.      [name]. atorvastatin (LIPITOR) 40 MG tablet Take 1 tablet (40 mg total) by mouth daily at 6 PM. 90 tablet 3  . b complex vitamins tablet Take 1 tablet by mouth daily.    . Cholecalciferol (VITAMIN D) 2000 UNITS CAPS Take 2,000 Units by mouth daily.    .[name]  Coenzyme Q10 (COQ10 PO) Take 1 tablet by mouth daily.     . hydrochlorothiazide (HYDRODIURIL) 25 MG tablet Take 0.5 tablets (12.5 mg total) by mouth daily as needed (for swelling). 45 tablet 3  . irbesartan (AVAPRO) 150 MG tablet TAKE ONE TABLET BY MOUTH ONCE DAILY 90 tablet 2  . OMEGA 3 1200 MG CAPS Take 1 capsule by mouth 3 (three) times daily.     . propranolol ER (INDERAL LA) 60 MG 24 hr capsule Take 1 capsule (60 mg total) by mouth daily. 90 capsule 3   No current facility-administered medications on file prior to visit.    Review of Systems  Constitutional: Negative for unusual diaphoresis or night sweats HENT: Negative for ear swelling or  discharge Eyes: Negative for worsening visual haziness  Respiratory: Negative for choking and stridor.   Gastrointestinal: Negative for distension or worsening eructation Genitourinary: Negative for retention or change in urine volume.  Musculoskeletal: Negative for other MSK pain or swelling Skin: Negative for color change and worsening wound Neurological: Negative for tremors and numbness other than noted  Psychiatric/Behavioral: Negative for decreased concentration or agitation other than above       Objective:   Physical Exam BP 136/82   Pulse 66   Temp 98.1 F (36.7 C) (Oral)   Resp 20   Wt 183 lb (83 kg)   SpO2 94%   BMI 26.26 kg/m  VS noted,  Constitutional: Pt appears in no apparent distress HENT: Head: NCAT.  Right Ear: External ear normal.  Left Ear: External ear normal.  Eyes: . Pupils are equal, round, and reactive to light. Conjunctivae and EOM are normal Neck: Normal range of motion. Neck supple.  Cardiovascular: Normal rate and regular rhythm.   Pulmonary/Chest: Effort normal and breath sounds without rales or wheezing.  Abd:  Soft, NT, ND, + BS Neurological: Pt is alert. Not confused , motor grossly intact except + right foot drop and chin tremor Skin: Skin is warm. No rash, no LE edema Psychiatric: Pt behavior is normal. No agitation.  Spine nontender  Feb 2017 urine cx Culture ENTEROCOCCUS SPECIES  Colony Count 75,000 COLONIES/ML  Organism ID, Bacteria ENTEROCOCCUS SPECIES  Resulting Agency [facility]  Susceptibility    Enterococcus species    Not Specified    AMPICILLIN <=2 "><=2  Sensitive    LEVOFLOXACIN 1  Sensitive    NITROFURANTOIN <=16 "><=16  Sensitive    TETRACYCLINE >=16  Resistant    VANCOMYCIN 1  Sensitive          Assessment & Plan:

## 2016-05-07 NOTE — Progress Notes (Signed)
Pre visit review using our clinic review tool, if applicable. No additional management support is needed unless otherwise documented below in the visit note. 

## 2016-05-07 NOTE — Patient Instructions (Signed)
You had the flu shot today  Please take all new medication as prescribed - the tramdol long acting as needed (and let us know if you would need to change to the short acting if the long is too expensive)  Please continue all other medications as before, and refills have been done if requested.  Please have the pharmacy call with any other refills you may need.  Please continue your efforts at being more active, low cholesterol diet, and weight control.  You are otherwise up to date with prevention measures today.  Please keep your appointments with your specialists as you may have planned  Please return in 6 months, or sooner if needed, with Lab testing done 3-5 days before

## 2016-05-07 NOTE — Assessment & Plan Note (Signed)
stable overall by history and exam, recent data reviewed with pt, and pt to continue medical treatment as before,  to f/u any worsening symptoms or concerns Lab Results  Component Value Date   HGBA1C 6.2 11/07/2015

## 2016-05-07 NOTE — Assessment & Plan Note (Signed)
stable overall by history and exam, recent data reviewed with pt, and pt to continue medical treatment as before,  to f/u any worsening symptoms or concerns BP Readings from Last 3 Encounters:  05/07/16 136/82  01/24/16 128/60  12/26/15 130/62

## 2016-05-07 NOTE — Assessment & Plan Note (Signed)
stable overall by history and exam, recent data reviewed with pt, and pt to continue medical treatment as before,  to f/u any worsening symptoms or concerns' Lab Results  Component Value Date   LDLCALC 55 11/07/2015

## 2016-05-07 NOTE — Assessment & Plan Note (Signed)
Ok for long acting prn tramadol,  to f/u any worsening symptoms or concerns

## 2016-05-08 ENCOUNTER — Telehealth: Payer: Self-pay | Admitting: Internal Medicine

## 2016-05-08 ENCOUNTER — Telehealth: Payer: Self-pay

## 2016-05-08 MED ORDER — TRAMADOL HCL 50 MG PO TABS: 50.0000 mg | ORAL_TABLET | Freq: Three times a day (TID) | ORAL | 1 refills | Status: DC | PRN

## 2016-05-08 NOTE — Addendum Note (Signed)
Addended by: Corwin LevinsJOHN, Deegan Valentino W on: 05/08/2016 01:16 PM   Modules accepted: Orders

## 2016-05-08 NOTE — Telephone Encounter (Signed)
Done hardcopy to Corinne  

## 2016-05-08 NOTE — Telephone Encounter (Signed)
Patient states he can not afford tramadol.  Patient states that pharmacist states that if he had tramadol prescribed for 50mg  for 90 days there would only be a $10.00 copay. Patient states he understands this would be a short term script.  Would like to know if this could be done or if there was another medication that would not cost as much.

## 2016-05-08 NOTE — Telephone Encounter (Signed)
Please advise patient is needing the regular release of the medication tramadol due to his insurance.

## 2016-05-18 ENCOUNTER — Other Ambulatory Visit: Payer: Self-pay | Admitting: Internal Medicine

## 2016-06-25 ENCOUNTER — Encounter: Payer: Self-pay | Admitting: Internal Medicine

## 2016-06-25 ENCOUNTER — Other Ambulatory Visit (INDEPENDENT_AMBULATORY_CARE_PROVIDER_SITE_OTHER): Payer: Medicare Other

## 2016-06-25 ENCOUNTER — Ambulatory Visit (INDEPENDENT_AMBULATORY_CARE_PROVIDER_SITE_OTHER): Payer: Medicare Other | Admitting: Internal Medicine

## 2016-06-25 VITALS — BP 122/80 | HR 82 | Temp 98.0°F | Resp 20 | Wt 178.0 lb

## 2016-06-25 DIAGNOSIS — R300 Dysuria: Secondary | ICD-10-CM | POA: Diagnosis not present

## 2016-06-25 DIAGNOSIS — R7302 Impaired glucose tolerance (oral): Secondary | ICD-10-CM | POA: Diagnosis not present

## 2016-06-25 DIAGNOSIS — I10 Essential (primary) hypertension: Secondary | ICD-10-CM | POA: Diagnosis not present

## 2016-06-25 LAB — URINALYSIS, ROUTINE W REFLEX MICROSCOPIC
Bilirubin Urine: NEGATIVE
Hgb urine dipstick: NEGATIVE
Nitrite: NEGATIVE
TOTAL PROTEIN, URINE-UPE24: 100 — AB
URINE GLUCOSE: NEGATIVE
UROBILINOGEN UA: 0.2 (ref 0.0–1.0)
pH: 5.5 (ref 5.0–8.0)

## 2016-06-25 MED ORDER — NITROFURANTOIN MONOHYD MACRO 100 MG PO CAPS: 100.0000 mg | ORAL_CAPSULE | Freq: Two times a day (BID) | ORAL | 0 refills | Status: DC

## 2016-06-25 NOTE — Patient Instructions (Signed)
Please take all new medication as prescribed  - the antibiotic  Please continue all other medications as before, and refills have been done if requested.  Please have the pharmacy call with any other refills you may need.  Please continue your efforts at being more active, low cholesterol diet, and weight control.  Please keep your appointments with your specialists as you may have planned  Please go to the LAB in the Basement (turn left off the elevator) for the tests to be done today - just the urine testing today  You will be contacted by phone if any changes need to be made immediately.  Otherwise, you will receive a letter about your results with an explanation, but please check with MyChart first.  Please remember to sign up for MyChart if you have not done so, as this will be important to you in the future with finding out test results, communicating by private email, and scheduling acute appointments online when needed.    

## 2016-06-25 NOTE — Progress Notes (Signed)
Pre visit review using our clinic review tool, if applicable. No additional management support is needed unless otherwise documented below in the visit note. 

## 2016-06-25 NOTE — Progress Notes (Signed)
Subjective:    Patient ID: Ronald [name], male    DOB: 12/31/1929, 82 y.o.   MRN: [id number]  HPI  Here to f/u with acute visit; c/o urinary symptoms such as dysuria, frequency, urgency, without flank pain, hematuria or n/v, fever, chills, for 2 days, without confusion, but has had also mild urinary retention with some strain and difficulty getting started.  Pt denies chest pain, increased sob or doe, wheezing, orthopnea, PND, increased LE swelling, palpitations, dizziness or syncope.  Pt denies new neurological symptoms such as new headache, or facial or extremity weakness or numbness.   Pt denies polydipsia, polyuria, Had similar symptoms with prior enterococcal UTI, successfully tx with macrobid Past Medical History:  Diagnosis Date  . ALLERGIC RHINITIS 11/25/2007  . ANXIETY 11/25/2007  . COLONIC POLYPS, HX OF 11/25/2007  . CORONARY ARTERY DISEASE 11/25/2007  . Cough 01/04/2009  . HEMORRHOIDS, INTERNAL 11/06/2010  . HOARSENESS 01/04/2009  . HYPERLIPIDEMIA 11/25/2007  . HYPERTENSION 11/25/2007  . Impaired glucose tolerance 04/21/2013  . LOW BACK PAIN 11/25/2007  . Lumbar radiculopathy, chronic    with right foot drop  . PEPTIC ULCER DISEASE 11/25/2007  . Preventative health care 04/07/2011  . PSA, INCREASED 01/05/2009  . Right foot drop   . Spasmodic dysphonia   . TREMOR, ESSENTIAL 11/25/2007   Past Surgical History:  Procedure Laterality Date  . APPENDECTOMY    . BACK SURGERY    . CORONARY ARTERY BYPASS GRAFT    . s/p lumbar laminectomy and fusion    . TONSILLECTOMY      reports that he has never smoked. He has never used smokeless tobacco. He reports that he drinks alcohol. He reports that he does not use drugs. family history includes Colon cancer in his father. No Known Allergies Current Outpatient Prescriptions on File Prior to Visit  Medication Sig Dispense Refill  . aspirin 81 MG tablet Take 81 mg by mouth daily.      [name]. atorvastatin (LIPITOR) 40 MG tablet TAKE ONE TABLET BY MOUTH  ONCE DAILY AT  6 PM 90 tablet 3  . b complex vitamins tablet Take 1 tablet by mouth daily.    . Cholecalciferol (VITAMIN D) 2000 UNITS CAPS Take 2,000 Units by mouth daily.    . Coenzyme Q10 (COQ10 PO) Take 1 tablet by mouth daily.     . hydrochlorothiazide (HYDRODIURIL) 25 MG tablet Take 0.5 tablets (12.5 mg total) by mouth daily as needed (for swelling). 45 tablet 3  . irbesartan (AVAPRO) 150 MG tablet TAKE ONE TABLET BY MOUTH ONCE DAILY 90 tablet 2  . OMEGA 3 1200 MG CAPS Take 1 capsule by mouth 3 (three) times daily.     . propranolol ER (INDERAL LA) 60 MG 24 hr capsule Take 1 capsule (60 mg total) by mouth daily. 90 capsule 3  . traMADol (ULTRAM) 50 MG tablet Take 1 tablet (50 mg total) by mouth every 8 (eight) hours as needed. 90 tablet 1   No current facility-administered medications on file prior to visit.    Review of Systems  Constitutional: Negative for unusual diaphoresis or night sweats HENT: Negative for ear swelling or discharge Eyes: Negative for worsening visual haziness  Respiratory: Negative for choking and stridor.   Gastrointestinal: Negative for distension or worsening eructation Genitourinary: Negative for retention or change in urine volume.  Musculoskeletal: Negative for other MSK pain or swelling Skin: Negative for color change and worsening wound Neurological: Negative for tremors and numbness other than noted  Psychiatric/Behavioral: Negative for decreased concentration or agitation other than above       Objective:   Physical Exam BP 122/80   Pulse 82   Temp 98 F (36.7 C) (Oral)   Resp 20   Wt 178 lb (80.7 kg)   SpO2 95%   BMI 25.54 kg/m  VS noted, mild ill, non toxic Constitutional: Pt appears in no apparent distress HENT: Head: NCAT.  Right Ear: External ear normal.  Left Ear: External ear normal.  Eyes: . Pupils are equal, round, and reactive to light. Conjunctivae and EOM are normal Neck: Normal range of motion. Neck supple.    Cardiovascular: Normal rate and regular rhythm.   Pulmonary/Chest: Effort normal and breath sounds without rales or wheezing.  Abd:  Soft, ND, + BS with low mid abd tender, no flank tender Neurological: Pt is alert. Not confused , motor grossly intact Skin: Skin is warm. No rash, no LE edema Psychiatric: Pt behavior is normal. No agitation.      Assessment & Plan:

## 2016-06-27 LAB — URINE CULTURE

## 2016-06-29 NOTE — Assessment & Plan Note (Signed)
stable overall by history and exam, recent data reviewed with pt, and pt to continue medical treatment as before,  to f/u any worsening symptoms or concerns Lab Results  Component Value Date   HGBA1C 6.2 11/07/2015    

## 2016-06-29 NOTE — Assessment & Plan Note (Signed)
stable overall by history and exam, recent data reviewed with pt, and pt to continue medical treatment as before,  to f/u any worsening symptoms or concerns BP Readings from Last 3 Encounters:  06/25/16 122/80  05/07/16 136/82  01/24/16 128/60

## 2016-06-29 NOTE — Assessment & Plan Note (Signed)
Likely recurrent uti, Mild to mod, for antibx course,  to f/u any worsening symptoms or concerns

## 2016-07-02 ENCOUNTER — Encounter: Payer: Self-pay | Admitting: Internal Medicine

## 2016-11-06 ENCOUNTER — Other Ambulatory Visit (INDEPENDENT_AMBULATORY_CARE_PROVIDER_SITE_OTHER): Payer: Medicare Other

## 2016-11-06 ENCOUNTER — Ambulatory Visit (INDEPENDENT_AMBULATORY_CARE_PROVIDER_SITE_OTHER): Payer: Medicare Other | Admitting: Internal Medicine

## 2016-11-06 ENCOUNTER — Encounter: Payer: Self-pay | Admitting: Internal Medicine

## 2016-11-06 VITALS — BP 138/76 | HR 57 | Temp 97.7°F | Resp 18 | Ht 70.0 in | Wt 183.0 lb

## 2016-11-06 DIAGNOSIS — Z0000 Encounter for general adult medical examination without abnormal findings: Secondary | ICD-10-CM

## 2016-11-06 DIAGNOSIS — R7302 Impaired glucose tolerance (oral): Secondary | ICD-10-CM

## 2016-11-06 DIAGNOSIS — N390 Urinary tract infection, site not specified: Secondary | ICD-10-CM

## 2016-11-06 DIAGNOSIS — I10 Essential (primary) hypertension: Secondary | ICD-10-CM | POA: Diagnosis not present

## 2016-11-06 DIAGNOSIS — E785 Hyperlipidemia, unspecified: Secondary | ICD-10-CM | POA: Diagnosis not present

## 2016-11-06 LAB — HEPATIC FUNCTION PANEL
ALK PHOS: 67 U/L (ref 39–117)
ALT: 21 U/L (ref 0–53)
AST: 26 U/L (ref 0–37)
Albumin: 4 g/dL (ref 3.5–5.2)
BILIRUBIN DIRECT: 0.2 mg/dL (ref 0.0–0.3)
TOTAL PROTEIN: 6.4 g/dL (ref 6.0–8.3)
Total Bilirubin: 1.2 mg/dL (ref 0.2–1.2)

## 2016-11-06 LAB — TSH: TSH: 2.31 u[IU]/mL (ref 0.35–4.50)

## 2016-11-06 LAB — LIPID PANEL
Cholesterol: 139 mg/dL (ref 0–200)
HDL: 59.1 mg/dL (ref >39.00)
LDL Cholesterol: 70 mg/dL (ref 0–99)
NONHDL: 79.82
Total CHOL/HDL Ratio: 2
Triglycerides: 49 mg/dL (ref 0.0–149.0)
VLDL: 9.8 mg/dL (ref 0.0–40.0)

## 2016-11-06 LAB — CBC WITH DIFFERENTIAL/PLATELET
BASOS ABS: 0 10*3/uL (ref 0.0–0.1)
BASOS PCT: 0.5 % (ref 0.0–3.0)
EOS ABS: 0.2 10*3/uL (ref 0.0–0.7)
Eosinophils Relative: 4.7 % (ref 0.0–5.0)
HCT: 37 % — ABNORMAL LOW (ref 39.0–52.0)
Hemoglobin: 12.8 g/dL — ABNORMAL LOW (ref 13.0–17.0)
LYMPHS PCT: 17.8 % (ref 12.0–46.0)
Lymphs Abs: 0.8 10*3/uL (ref 0.7–4.0)
MCHC: 34.6 g/dL (ref 30.0–36.0)
MCV: 106.8 fl — ABNORMAL HIGH (ref 78.0–100.0)
MONO ABS: 0.6 10*3/uL (ref 0.1–1.0)
Monocytes Relative: 13.2 % — ABNORMAL HIGH (ref 3.0–12.0)
NEUTROS ABS: 2.8 10*3/uL (ref 1.4–7.7)
NEUTROS PCT: 63.8 % (ref 43.0–77.0)
PLATELETS: 244 10*3/uL (ref 150.0–400.0)
RBC: 3.46 Mil/uL — ABNORMAL LOW (ref 4.22–5.81)
RDW: 13.9 % (ref 11.5–15.5)
WBC: 4.5 10*3/uL (ref 4.0–10.5)

## 2016-11-06 LAB — URINALYSIS, ROUTINE W REFLEX MICROSCOPIC
BILIRUBIN URINE: NEGATIVE
Hgb urine dipstick: NEGATIVE
KETONES UR: NEGATIVE
Leukocytes, UA: NEGATIVE
Nitrite: NEGATIVE
PH: 6 (ref 5.0–8.0)
RBC / HPF: NONE SEEN (ref 0–?)
TOTAL PROTEIN, URINE-UPE24: NEGATIVE
UROBILINOGEN UA: 0.2 (ref 0.0–1.0)
Urine Glucose: NEGATIVE
WBC, UA: NONE SEEN (ref 0–?)

## 2016-11-06 LAB — BASIC METABOLIC PANEL
BUN: 18 mg/dL (ref 6–23)
CHLORIDE: 103 meq/L (ref 96–112)
CO2: 28 meq/L (ref 19–32)
CREATININE: 1.05 mg/dL (ref 0.40–1.50)
Calcium: 9.1 mg/dL (ref 8.4–10.5)
GFR: 71.34 mL/min (ref >60.00)
Glucose, Bld: 112 mg/dL — ABNORMAL HIGH (ref 70–99)
Potassium: 4.6 mEq/L (ref 3.5–5.1)
Sodium: 136 mEq/L (ref 135–145)

## 2016-11-06 LAB — HEMOGLOBIN A1C: Hgb A1c MFr Bld: 6.1 % (ref 4.6–6.5)

## 2016-11-06 NOTE — Progress Notes (Addendum)
Subjective:    Patient ID: Ronald [name], male    DOB: Feb 02, 1930, 83 y.o.   MRN: [id number]  HPI  Here for wellness and f/u;  Overall doing ok;  Pt denies Chest pain, worsening SOB, DOE, wheezing, orthopnea, PND, worsening LE edema, palpitations, dizziness or syncope.  Pt denies neurological change such as new headache, facial or extremity weakness.  Pt denies polydipsia, polyuria, or low sugar symptoms. Pt states overall good compliance with treatment and medications, good tolerability, and has been trying to follow appropriate diet.  Pt denies worsening depressive symptoms, suicidal ideation or panic. No fever, night sweats, wt loss, loss of appetite, or other constitutional symptoms.  Pt states good ability with ADL's, has low fall risk, home safety reviewed and adequate, no other significant changes in hearing or vision, and only occasionally active with exercise. BP Readings from Last 3 Encounters:  11/06/16 138/76  06/25/16 122/80  05/07/16 136/82  Bp at home in the 125 sbp range.    Wt Readings from Last 3 Encounters:  11/06/16 183 lb (83 kg)  06/25/16 178 lb (80.7 kg)  05/07/16 183 lb (83 kg)  Does some regular exercise at the club, injured the left rotater cuff but now seems to be improving after 2 mo, now about 80% per pt, , so does not want sport med referral.   Past Medical History:  Diagnosis Date  . ALLERGIC RHINITIS 11/25/2007  . ANXIETY 11/25/2007  . COLONIC POLYPS, HX OF 11/25/2007  . CORONARY ARTERY DISEASE 11/25/2007  . Cough 01/04/2009  . HEMORRHOIDS, INTERNAL 11/06/2010  . HOARSENESS 01/04/2009  . HYPERLIPIDEMIA 11/25/2007  . HYPERTENSION 11/25/2007  . Impaired glucose tolerance 04/21/2013  . LOW BACK PAIN 11/25/2007  . Lumbar radiculopathy, chronic    with right foot drop  . PEPTIC ULCER DISEASE 11/25/2007  . Preventative health care 04/07/2011  . PSA, INCREASED 01/05/2009  . Right foot drop   . Spasmodic dysphonia   . TREMOR, ESSENTIAL 11/25/2007   Past Surgical History:   Procedure Laterality Date  . APPENDECTOMY    . BACK SURGERY    . CORONARY ARTERY BYPASS GRAFT    . s/p lumbar laminectomy and fusion    . TONSILLECTOMY      reports that he has never smoked. He has never used smokeless tobacco. He reports that he drinks alcohol. He reports that he does not use drugs. family history includes Colon cancer in his father. No Known Allergies Current Outpatient Prescriptions on File Prior to Visit  Medication Sig Dispense Refill  . aspirin 81 MG tablet Take 81 mg by mouth daily.      [name] atorvastatin (LIPITOR) 40 MG tablet TAKE ONE TABLET BY MOUTH ONCE DAILY AT  6 PM 90 tablet 3  . b complex vitamins tablet Take 1 tablet by mouth daily.    . Cholecalciferol (VITAMIN D) 2000 UNITS CAPS Take 2,000 Units by mouth daily.    . Coenzyme Q10 (COQ10 PO) Take 1 tablet by mouth daily.     . hydrochlorothiazide (HYDRODIURIL) 25 MG tablet Take 0.5 tablets (12.5 mg total) by mouth daily as needed (for swelling). 45 tablet 3  . irbesartan (AVAPRO) 150 MG tablet TAKE ONE TABLET BY MOUTH ONCE DAILY 90 tablet 2  . OMEGA 3 1200 MG CAPS Take 1 capsule by mouth 3 (three) times daily.     . propranolol ER (INDERAL LA) 60 MG 24 hr capsule Take 1 capsule (60 mg total) by mouth daily. 90 capsule 3  No current facility-administered medications on file prior to visit.     Review of Systems Constitutional: Negative for increased diaphoresis, or other activity, appetite or siginficant weight change other than noted HENT: Negative for worsening hearing loss, ear pain, facial swelling, mouth sores and neck stiffness.   Eyes: Negative for other worsening pain, redness or visual disturbance.  Respiratory: Negative for choking or stridor Cardiovascular: Negative for other chest pain and palpitations.  Gastrointestinal: Negative for worsening diarrhea, blood in stool, or abdominal distention Genitourinary: Negative for hematuria, flank pain or change in urine volume.  Musculoskeletal:  Negative for myalgias or other joint complaints.  Skin: Negative for other color change and wound or drainage.  Neurological: Negative for syncope and numbness. other than noted Hematological: Negative for adenopathy. or other swelling Psychiatric/Behavioral: Negative for hallucinations, SI, self-injury, decreased concentration or other worsening agitation.  All other system neg per pt    Objective:   Physical Exam BP 138/76   Pulse (!) 57   Temp 97.7 F (36.5 C)   Resp 18   Ht 5\' 10"  (1.778 m)   Wt 183 lb (83 kg)   SpO2 97%   BMI 26.26 kg/m  VS noted,  Constitutional: Pt is oriented to person, place, and time. Appears well-developed and well-nourished, in no significant distress Head: Normocephalic and atraumatic  Eyes: Conjunctivae and EOM are normal. Pupils are equal, round, and reactive to light Right Ear: External ear normal.  Left Ear: External ear normal Nose: Nose normal.  Mouth/Throat: Oropharynx is clear and moist  Neck: Normal range of motion. Neck supple. No JVD present. No tracheal deviation present or significant neck LA or mass Cardiovascular: Normal rate, regular rhythm, normal heart sounds and intact distal pulses.   Pulmonary/Chest: Effort normal and breath sounds without rales or wheezing  Abdominal: Soft. Bowel sounds are normal. NT. No HSM  Musculoskeletal: Normal range of motion. Exhibits no edema Lymphadenopathy: Has no cervical adenopathy.  Neurological: Pt is alert and oriented to person, place, and time. Pt has normal reflexes. No cranial nerve deficit. Motor grossly intact Skin: Skin is warm and dry. No rash noted or new ulcers Psychiatric:  Has normal mood and affect. Behavior is normal.  Left shoulder NT, FROM No other new exam findings  Medical screening examination/treatment/procedure(s) were performed by non-physician practitioner and as supervising physician I was immediately available for consultation/collaboration. I agree with above. Oliver BarreJames  Hadi, MD      Assessment & Plan:

## 2016-11-06 NOTE — Progress Notes (Signed)
Pre visit review using our clinic review tool, if applicable. No additional management support is needed unless otherwise documented below in the visit note. 

## 2016-11-06 NOTE — Progress Notes (Signed)
Subjective:   Ronald [name] is a 83 y.o. male who presents for an Initial Medicare Annual Wellness Visit.  Review of Systems  No ROS.  Medicare Wellness Visit.  Cardiac Risk Factors include: dyslipidemia;hypertension;male gender;advanced age (>71men, >73 women);family history of premature cardiovascular disease Sleep patterns: has interrupted sleep, feels rested on waking, gets up 3 times nightly to void and sleeps 7 hours nightly.   Home Safety/Smoke Alarms:  Feels safe in home. Smoke alarms in place.   Living environment; residence and Firearm Safety: 1-story house/ trailer, equipment: Cane, Type: [employer], no firearms, firearms stored safely. Lives with wife and has a lot of family support. Seat Belt Safety/Bike Helmet: Wears seat belt.   Counseling:   Eye Exam- goes yearly Dental- goes yearly   Male:   CCS-  N/A    PSA-  Lab Results  Component Value Date   PSA 2.00 01/25/2010   PSA 3.05 01/04/2009   PSA 1.20 11/25/2007    This SmartLink has not been configured with any valid records.       Objective:    Today's Vitals   11/06/16 0902 11/06/16 0905  BP: 138/76   Pulse: (!) 57   Resp: 18   SpO2: 97%   Weight: 183 lb (83 kg)   Height: 5\' 10"  (1.778 m)   PainSc:  3    Body mass index is 26.26 kg/m.  Current Medications (verified) Outpatient Encounter Prescriptions as of 11/06/2016  Medication Sig  . aspirin 81 MG tablet Take 81 mg by mouth daily.    [name] atorvastatin (LIPITOR) 40 MG tablet TAKE ONE TABLET BY MOUTH ONCE DAILY AT  6 PM  . b complex vitamins tablet Take 1 tablet by mouth daily.  . Cholecalciferol (VITAMIN D) 2000 UNITS CAPS Take 2,000 Units by mouth daily.  . Coenzyme Q10 (COQ10 PO) Take 1 tablet by mouth daily.   . hydrochlorothiazide (HYDRODIURIL) 25 MG tablet Take 0.5 tablets (12.5 mg total) by mouth daily as needed (for swelling).  . irbesartan (AVAPRO) 150 MG tablet TAKE ONE TABLET BY MOUTH ONCE DAILY  . OMEGA 3 1200 MG CAPS Take 1  capsule by mouth 3 (three) times daily.   . propranolol ER (INDERAL LA) 60 MG 24 hr capsule Take 1 capsule (60 mg total) by mouth daily.  . [DISCONTINUED] nitrofurantoin, macrocrystal-monohydrate, (MACROBID) 100 MG capsule Take 1 capsule (100 mg total) by mouth 2 (two) times daily. (Patient not taking: Reported on 11/06/2016)  . [DISCONTINUED] traMADol (ULTRAM) 50 MG tablet Take 1 tablet (50 mg total) by mouth every 8 (eight) hours as needed. (Patient not taking: Reported on 11/06/2016)   No facility-administered encounter medications on file as of 11/06/2016.     Allergies (verified) Patient has no known allergies.   History: Past Medical History:  Diagnosis Date  . ALLERGIC RHINITIS 11/25/2007  . ANXIETY 11/25/2007  . COLONIC POLYPS, HX OF 11/25/2007  . CORONARY ARTERY DISEASE 11/25/2007  . Cough 01/04/2009  . HEMORRHOIDS, INTERNAL 11/06/2010  . HOARSENESS 01/04/2009  . HYPERLIPIDEMIA 11/25/2007  . HYPERTENSION 11/25/2007  . Impaired glucose tolerance 04/21/2013  . LOW BACK PAIN 11/25/2007  . Lumbar radiculopathy, chronic    with right foot drop  . PEPTIC ULCER DISEASE 11/25/2007  . Preventative health care 04/07/2011  . PSA, INCREASED 01/05/2009  . Right foot drop   . Spasmodic dysphonia   . TREMOR, ESSENTIAL 11/25/2007   Past Surgical History:  Procedure Laterality Date  . APPENDECTOMY    .  BACK SURGERY    . CORONARY ARTERY BYPASS GRAFT    . s/p lumbar laminectomy and fusion    . TONSILLECTOMY     Family History  Problem Relation Age of Onset  . Colon cancer Father     colon   Social History   Occupational History  . Retired Retired   Social History Main Topics  . Smoking status: Never Smoker  . Smokeless tobacco: Never Used  . Alcohol use Yes     Comment: Occ  . Drug use: No  . Sexual activity: Not on file   Tobacco Counseling Counseling given: Not Answered   Activities of Daily Living In your present state of health, do you have any difficulty performing the  following activities: 11/06/2016  Hearing? N  Vision? N  Difficulty concentrating or making decisions? N  Walking or climbing stairs? N  Dressing or bathing? N  Doing errands, shopping? N  Preparing Food and eating ? N  Using the Toilet? N  In the past six months, have you accidently leaked urine? N  Do you have problems with loss of bowel control? N  Managing your Medications? N  Managing your Finances? N  Housekeeping or managing your Housekeeping? N  Some recent data might be hidden    Immunizations and Health Maintenance Immunization History  Administered Date(s) Administered  . H1N1 06/16/2008  . Influenza Split 05/28/2013  . Influenza,inj,Quad PF,36+ Mos 06/21/2014, 05/07/2016  . Influenza-Unspecified 06/15/2015  . Pneumococcal Conjugate-13 04/28/2014  . Pneumococcal Polysaccharide-23 09/16/2004, 04/16/2011  . Td 01/04/2009  . Zoster 08/17/2013   There are no preventive care reminders to display for this patient.  Patient Care Team: Corwin LevinsJames W Romone, MD as PCP - General  Indicate any recent Medical Services you may have received from other than [facility] providers in the past year (date may be approximate).    Assessment:   This is a routine wellness examination for Ronald [name].Physical assessment deferred to PCP.   Hearing/Vision screen Hearing Screening Comments: HOH due them getting stopped up Vision Screening Comments: Wears glasses  Dietary issues and exercise activities discussed: Current Exercise Habits: Structured exercise class, Type of exercise: walking;calisthenics, Time (Minutes): 40, Frequency (Times/Week): 5, Weekly Exercise (Minutes/Week): 200, Intensity: Moderate, Exercise limited by: orthopedic condition(s) Diet (meal preparation, eat out, water intake, caffeinated beverages, dairy products, fruits and vegetables): in general, a "healthy" diet  , well balanced, diabetic, low fat/ cholesterol, low salt  Drinks mainly water at least 5-6 bottles per day.  Goals     . maintaining current status of mobitlity          I intend to keep going to the gym and working with my wife in our garden. Continuing social activities with my family.      Depression Screen PHQ 2/9 Scores 11/06/2016 11/07/2015 05/04/2015 04/28/2014  PHQ - 2 Score 0 1 0 1    Fall Risk Fall Risk  11/06/2016 11/07/2015 05/04/2015 04/28/2014 04/21/2013  Falls in the past year? No No Yes No No  Number falls in past yr: - - 1 - -  Injury with Fall? - - No - -    Cognitive Function: MMSE - Mini Mental State Exam 11/06/2016  Orientation to time 5  Orientation to Place 5  Registration 3  Attention/ Calculation 5  Recall 2  Language- name 2 objects 2  Language- repeat 1  Language- follow 3 step command 3  Language- read & follow direction 1  Write a sentence 1  Copy  design 1  Total score 29        Screening Tests Health Maintenance  Topic Date Due  . TETANUS/TDAP  01/05/2019  . INFLUENZA VACCINE  Addressed  . PNA vac Low Risk Adult  Completed        Plan:    Patient has a shoulder injury that has improved but he is concerned about.  Patient would like to know if he can decrease the amount of statins he is taking. He has heard that statin medication can lead to increased memory loss. Patient states his ears are stopped up and he wants to discuss hydrochlorothiazide medication with doctor, Will notify PCP  Continue to eat heart healthy diet (full of fruits, vegetables, whole grains, lean protein, water--limit salt, fat, and sugar intake) and increase physical activity as tolerated.  Continue doing brain stimulating activities (puzzles, reading, adult coloring books, staying active) to keep memory sharp.   Bring a copy of your advance directives to your next office visit.    During the course of the visit Criston was educated and counseled about the following appropriate screening and preventive services:   Vaccines to include Pneumoccal, Influenza, Hepatitis B, Td, Zostavax,  HCV  Colorectal cancer screening  Cardiovascular disease screening  Diabetes screening  Glaucoma screening  Nutrition counseling  Prostate cancer screening   Patient Instructions (the written plan) were given to the patient.   Wanda Plump, RN   11/06/2016

## 2016-11-06 NOTE — Assessment & Plan Note (Signed)
stable overall by history and exam, recent data reviewed with pt, and pt to continue medical treatment as before,  to f/u any worsening symptoms or concerns BP Readings from Last 3 Encounters:  11/06/16 138/76  06/25/16 122/80  05/07/16 136/82

## 2016-11-06 NOTE — Assessment & Plan Note (Signed)
Two episodes last yr + enterococcus, for fu today

## 2016-11-06 NOTE — Patient Instructions (Addendum)
Please continue all other medications as before, and refills have been done if requested.  Please have the pharmacy call with any other refills you may need.  Please continue your efforts at being more active, low cholesterol diet, and weight control.  You are otherwise up to date with prevention measures today.  Please keep your appointments with your specialists as you may have planned  Please go to the LAB in the Basement (turn left off the elevator) for the tests to be done today  You will be contacted by phone if any changes need to be made immediately.  Otherwise, you will receive a letter about your results with an explanation, but please check with MyChart first.  Please remember to sign up for MyChart if you have not done so, as this will be important to you in the future with finding out test results, communicating by private email, and scheduling acute appointments online when needed.  Please return in 1 year for your yearly visit, or sooner if needed, with Lab testing done 3-5 days before  Continue to eat heart healthy diet (full of fruits, vegetables, whole grains, lean protein, water--limit salt, fat, and sugar intake) and increase physical activity as tolerated.  Continue doing brain stimulating activities (puzzles, reading, adult coloring books, staying active) to keep memory sharp.   Bring a copy of your advance directives to your next office visit.    Ronald [name] , Thank you for taking time to come for your Medicare Wellness Visit. I appreciate your ongoing commitment to your health goals. Please review the following plan we discussed and let me know if I can assist you in the future.   These are the goals we discussed: Goals    . maintaining current status of mobitlity          I intend to keep going to the gym and working with my wife in our garden. Continuing social activities with my family.       This is a list of the screening recommended for you and due  dates:  Health Maintenance  Topic Date Due  . Tetanus Vaccine  01/05/2019  . Flu Shot  Addressed  . Pneumonia vaccines  Completed

## 2016-11-06 NOTE — Assessment & Plan Note (Signed)

## 2016-11-06 NOTE — Assessment & Plan Note (Signed)
stable overall by history and exam, recent data reviewed with pt, and pt to continue medical treatment as before,  to f/u any worsening symptoms or concerns' Lab Results  Component Value Date   LDLCALC 55 11/07/2015    

## 2016-11-06 NOTE — Assessment & Plan Note (Signed)
stable overall by history and exam, recent data reviewed with pt, and pt to continue medical treatment as before,  to f/u any worsening symptoms or concerns Lab Results  Component Value Date   HGBA1C 6.2 11/07/2015   For fu lab

## 2016-11-07 LAB — URINE CULTURE: ORGANISM ID, BACTERIA: NO GROWTH

## 2016-11-27 DIAGNOSIS — H2513 Age-related nuclear cataract, bilateral: Secondary | ICD-10-CM | POA: Diagnosis not present

## 2016-12-23 NOTE — Progress Notes (Signed)
Subjective:   Ronald [name] was seen in consultation in the movement disorder clinic at the request of Dr. Eden Emms.  His PCP is Oliver Barre, MD.  The evaluation is for tremor.  The patient is a 83 y.o. right handed male with a history of tremor.  The tremor is primarily in the R and in the jaw.  The jaw does not bother him.  There is no family hx of tremor.    It is worse in the AM and worse with use.    Affected by caffeine:  Doesn't drink caffeine Affected by alcohol:  Yes, improves Affected by stress:  yes Affected by fatigue:  yes Spills soup if on spoon:  yes Spills spoonful of peas:  yes Spills glass of liquid if full:  yes if doesn't use both hands Affects ADL's (tying shoes, brushing teeth, etc): minimal  He does have a hx of foot drop x 3 years.  He had lumbar laminectomy several years ago after developing b/l foot drop and the laminectomy greatly helped the pain but the foot drop has been static.  The R is worse than the L.  09/21/13 update:  The pt is f/u today regarding ET.  I started him on primidone last visit, in early October.  He initially did well but seemed to lose efficacy and by 10/30, he had contacted me and we increased it to 50 mg bid.  Again, on 08/16/13, it was increased to 100 mg in the AM and 50 mg in the PM.  Again, he did well for a few days but then seemed to lose efficacy.  He states it is not as bad as it was before the medication but not as good as when he initially started it.  He also now has a feeling like a "tiredness" comes over his eyes, as if he needs to close them.  He does not describe eyelid opening apraxia.  He has noted changes in the voice as well.  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  Occasionally, he will have momentary double vision, but states that he can "blink it away."  He did see the ophthalmologist since last visit and the examination was unremarkable, and  unchanged from 3 years ago.    12/13/13 update:  The patient is following up regarding his essential tremor talk to his cardiologist and he reported that it would be okay to change his beta blocker to Inderal LA, 60 mg.  The patient felt that the primidone caused his eyes to be "tired."  When he stopped the primidone, that seemed to get better but tremor got worse.  He is no longer having the tired feeling of the eyes.   He is exercising without a problem.  "I feel that I am 83 years old, I am not embarrassed by the tremor, I don't have to do things that require significant precision any longer."  He still has trouble pouring water from a glass.  Has trouble with signature and handwriting.  12/26/14 update:  The patient has a history of essential tremor.  He was last seen approximately 1 year ago.  Pt states that he is working out at the club 4-5 days per week.  His biggest issue is taking care of his wife; she is dealing with SE of chemo.  He is on Inderal LA, 60 mg daily.  He previously tried primidone but felt that  that caused "tired eyes."  He still has tremor and "I manage it.  It is about the same."  He doesn't want to raise the dosage.    12/26/15 update:  Patient follows up today.  I have reviewed prior records made available to me.  I have not seen him in about a year.  He has essential tremor and is on Inderal LA, 60 mg daily.  This only partially controls tremor, but he has not wanted to increase the medication.  He feels that he has been stable.   R hand is the biggest problem and "I don't pay any attention to it."   He felt primidone caused him to have side effects which included "tired eyes."  Wife has been sick with lung cancer and that has been biggest problem.    12/25/16 update:  Pt seen today in f/u.  The records that were made available to me were reviewed.  He remains on inderal LA 60 mg daily.  "I don't even notice the tremor."  Biggest c/o is thin skin.  Also states that his foot drop  bothers him a bit because he can't play golf.  However, the biggest reason he can't play golf is because of balance as opposed to the foot drop.  Has had foot drop for years post laminectomy.  Current/Previously tried tremor medications: n/a but on metoprolol for other reasons.  Current medications that may exacerbate tremor:  n/a  Outside reports reviewed: historical medical records.  No Known Allergies  Current Outpatient Prescriptions on File Prior to Visit  Medication Sig Dispense Refill  . aspirin 81 MG tablet Take 81 mg by mouth daily.      [name] atorvastatin (LIPITOR) 40 MG tablet TAKE ONE TABLET BY MOUTH ONCE DAILY AT  6 PM 90 tablet 3  . b complex vitamins tablet Take 1 tablet by mouth daily.    . Cholecalciferol (VITAMIN D) 2000 UNITS CAPS Take 2,000 Units by mouth daily.    . Coenzyme Q10 (COQ10 PO) Take 1 tablet by mouth daily.     . hydrochlorothiazide (HYDRODIURIL) 25 MG tablet Take 0.5 tablets (12.5 mg total) by mouth daily as needed (for swelling). 45 tablet 3  . OMEGA 3 1200 MG CAPS Take 1 capsule by mouth 3 (three) times daily.     . propranolol ER (INDERAL LA) 60 MG 24 hr capsule Take 1 capsule (60 mg total) by mouth daily. 90 capsule 3   No current facility-administered medications on file prior to visit.     Past Medical History:  Diagnosis Date  . ALLERGIC RHINITIS 11/25/2007  . ANXIETY 11/25/2007  . COLONIC POLYPS, HX OF 11/25/2007  . CORONARY ARTERY DISEASE 11/25/2007  . Cough 01/04/2009  . HEMORRHOIDS, INTERNAL 11/06/2010  . HOARSENESS 01/04/2009  . HYPERLIPIDEMIA 11/25/2007  . HYPERTENSION 11/25/2007  . Impaired glucose tolerance 04/21/2013  . LOW BACK PAIN 11/25/2007  . Lumbar radiculopathy, chronic    with right foot drop  . PEPTIC ULCER DISEASE 11/25/2007  . Preventative health care 04/07/2011  . PSA, INCREASED 01/05/2009  . Right foot drop   . Spasmodic dysphonia   . TREMOR, ESSENTIAL 11/25/2007    Past Surgical History:  Procedure Laterality Date  .  APPENDECTOMY    . BACK SURGERY    . CORONARY ARTERY BYPASS GRAFT    . s/p lumbar laminectomy and fusion    . TONSILLECTOMY      Social History   Social History  . Marital status: Married  Spouse name: N/A  . Number of children: 3  . Years of education: N/A   Occupational History  . Retired Retired   Social History Main Topics  . Smoking status: Never Smoker  . Smokeless tobacco: Never Used  . Alcohol use Yes     Comment: Occ  . Drug use: No  . Sexual activity: Not on file   Other Topics Concern  . Not on file   Social History Narrative   Son is internist in [place]    Family Status  Relation Status  . Father Deceased at age 67   Colon Cancer  . Mother Deceased at age 6   Old Age  . Son Alive   3    Review of Systems A complete 10 system ROS was obtained and was negative apart from what is mentioned.   Objective:   VITALS:   Vitals:   12/25/16 1103  BP: 130/80  Pulse: 64  Weight: 183 lb (83 kg)  Height:  (1.778 m)   Gen:  Appears stated age and in NAD. HEENT:  Normocephalic, atraumatic. The mucous membranes are moist. The superficial temporal CV:  RRR Lungs:  CTAB Neck:  No bruits bilaterally  NEUROLOGICAL:  Orientation:  The patient is alert and oriented x 3.   Cranial nerves: There is good facial symmetry. [name] Speech is fluent and clear but there is mild spasmodic dysphonia with mild hoarseness of the voice. Soft palate rises symmetrically and there is no tongue deviation. Hearing is intact to conversational tone. Tone: Tone is good throughout. Sensation: Sensation is intact to light touch throughout. Coordination:  The patient has no dysdiadichokinesia or dysmetria. Motor: Strength is 5/5 in the bilateral upper and lower extremities with the exception of 0/5 ankle dorsiflexion b/l.  There is 0/5 strength in b/l EDB.  There is 3/5 ankle eversion on the L and 0/5 on the R.  Shoulder shrug is equal bilaterally.  There is no pronator  drift.  There are no fasciculations noted. Gait and Station: The patient has a "marching gait" on the R due to foot drop.  MOVEMENT EXAM: Tremor:  There is tremor in the UE, noted most significantly with action.  It is mild to mod on the R and mild on the L today.   Tremor slightly increases when he holds a weight in the hand.  He has jaw tremor.     Assessment/Plan:   1.  Essential Tremor.  -This is somewhat asymmetric, but that is not unheard of.  He has no features of any other disease.  We talked about various treatments.  He is definitely not interested in DBS, and I don't disagree with him.  Primidone has been beneficial, but the patient felt he had "tired eyes" on the medication.  He doesn't think that the inderal LA has been as beneficial but he doesn't want to increase the dosage.  Refills given today.  He can certainly let me know if he changes his mind. 2.  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 3.  Foot drop  -Chronic after laminectomy.  Offered AFO but declined.  Thinks he has one from years ago at home. 3.  F/u one year

## 2016-12-25 ENCOUNTER — Encounter: Payer: Self-pay | Admitting: Neurology

## 2016-12-25 ENCOUNTER — Ambulatory Visit (INDEPENDENT_AMBULATORY_CARE_PROVIDER_SITE_OTHER): Payer: Medicare Other | Admitting: Neurology

## 2016-12-25 ENCOUNTER — Other Ambulatory Visit: Payer: Self-pay | Admitting: Internal Medicine

## 2016-12-25 VITALS — BP 130/80 | HR 64 | Ht 70.0 in | Wt 183.0 lb

## 2016-12-25 DIAGNOSIS — G250 Essential tremor: Secondary | ICD-10-CM | POA: Diagnosis not present

## 2016-12-25 DIAGNOSIS — M21371 Foot drop, right foot: Secondary | ICD-10-CM | POA: Diagnosis not present

## 2016-12-25 MED ORDER — PROPRANOLOL HCL ER 60 MG PO CP24: 60.0000 mg | ORAL_CAPSULE | Freq: Every day | ORAL | 3 refills | Status: DC

## 2016-12-25 NOTE — Patient Instructions (Signed)
1. Take orthotic prescription to [facility] in [place] if you are interested in being fitted to treat foot drop.  Address: [address] [place], [place], [place] [address] Phone: [phone]

## 2017-03-10 NOTE — Progress Notes (Signed)
Patient ID: Ronald [name], male   DOB: 1930/01/01, 83 y.o.   MRN: [id number]  The patient is 83 y.o.  and return for management of CAD. Previously seen by Dr Juanda Chance  Primary is Dr Jonny Ruiz   CABG 1998. His last catheterization was in 2003 at which time he had total occlusion of the vein graft to the right coronary and good LV function. Negative Myoview in 2008.  He's been doing quite well has had no recent chest pain shortness of breath or palpitations. 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  All other systems reviewed and negative  General: Affect appropriate Healthy:  appears stated age HEENT: normal Neck supple with no adenopathy JVP normal no bruits no thyromegaly Lungs clear with no wheezing and good diaphragmatic motion Heart:  S1/S2 no murmur, no rub, gallop or click PMI normal Abdomen: benighn, BS positve, no tenderness, no AAA no bruit.  No HSM or HJR Distal pulses intact with no bruits Plus one LE bilateral edema Neuro non-focal right facial and UE tremor Skin warm and dry Right peroneal injury with foot drop    Current Outpatient Prescriptions  Medication Sig Dispense Refill  . aspirin 81 MG tablet Take 81 mg  by mouth daily.      [name] atorvastatin (LIPITOR) 40 MG tablet TAKE ONE TABLET BY MOUTH ONCE DAILY AT  6 PM 90 tablet 3  . b complex vitamins tablet Take 1 tablet by mouth daily.    . Cholecalciferol (VITAMIN D) 2000 UNITS CAPS Take 2,000 Units by mouth daily.    . Coenzyme Q10 (COQ10 PO) Take 1 tablet by mouth daily.     . irbesartan (AVAPRO) 150 MG tablet TAKE ONE TABLET BY MOUTH ONCE DAILY 90 tablet 2  . OMEGA 3 1200 MG CAPS Take 1 capsule by mouth 2 (two) times daily.     . propranolol ER (INDERAL LA) 60 MG 24 hr capsule Take 1 capsule (60 mg total) by mouth daily. 90 capsule 3   No current facility-administered medications for this visit.     Allergies  Patient has no known allergies.  Electrocardiogram:  07/2015  SR RBBB LAD  ? Old IMI 03/11/17  SR rate 61 RBBB LAFB   Assessment and Plan  CAD/CABG:  CABG 98 cath 2003 occluded RCA graft.  Myovue 2008 non ischemic Active working out at gym everyday no symptoms continue medical Rx HTN: Well controlled.  Continue current medications and low sodium Dash type diet.   Chol:   Cholesterol is at goal.  Continue current dose of statin and diet Rx.  No myalgias or side effects.  F/U  LFT's in 6 months. Lab Results  Component Value Date  LDLCALC 70 11/06/2016   Bifasicular block:  Stable no high grade AV block f/u ECG q 6 months         Edema:  Dependant change HCTZ to lasix BMET in 3 weeks f/u Dr Jonny RuizJohn   F/u with me in 6 months  Dillon BjorkPeter Steed Kanaan Terez [name]

## 2017-03-11 ENCOUNTER — Encounter: Payer: Self-pay | Admitting: Cardiovascular Disease

## 2017-03-11 ENCOUNTER — Ambulatory Visit (INDEPENDENT_AMBULATORY_CARE_PROVIDER_SITE_OTHER): Payer: Medicare Other | Admitting: Cardiovascular Disease

## 2017-03-11 VITALS — BP 144/70 | HR 60 | Ht 70.0 in | Wt 180.1 lb

## 2017-03-11 DIAGNOSIS — I10 Essential (primary) hypertension: Secondary | ICD-10-CM

## 2017-03-11 DIAGNOSIS — Z79899 Other long term (current) drug therapy: Secondary | ICD-10-CM | POA: Diagnosis not present

## 2017-03-11 MED ORDER — FUROSEMIDE 20 MG PO TABS: 20.0000 mg | ORAL_TABLET | Freq: Every day | ORAL | 3 refills | Status: DC

## 2017-03-11 NOTE — Patient Instructions (Signed)
Medication Instructions:  Your physician has recommended you make the following change in your medication:  1-STOP hydrochlorothiazide  2-START furosemide 20 mg by mouth daily  Labwork: Your physician recommends that you return for lab work in: 3 weeks for BMET   Testing/Procedures: NONE  Follow-Up: Your physician wants you to follow-up in: 12 months with Dr. Eden EmmsNishan. You will receive a reminder letter in the mail two months in advance. If you don't receive a letter, please call our office to schedule the follow-up appointment.   If you need a refill on your cardiac medications before your next appointment, please call your pharmacy.

## 2017-04-01 ENCOUNTER — Other Ambulatory Visit: Payer: Medicare Other

## 2017-04-01 DIAGNOSIS — Z79899 Other long term (current) drug therapy: Secondary | ICD-10-CM | POA: Diagnosis not present

## 2017-04-01 DIAGNOSIS — I10 Essential (primary) hypertension: Secondary | ICD-10-CM | POA: Diagnosis not present

## 2017-04-01 LAB — BASIC METABOLIC PANEL
BUN/Creatinine Ratio: 23 (ref 10–24)
BUN: 24 mg/dL (ref 8–27)
CALCIUM: 8.7 mg/dL (ref 8.6–10.2)
CHLORIDE: 103 mmol/L (ref 96–106)
CO2: 19 mmol/L — AB (ref 20–29)
Creatinine, Ser: 1.05 mg/dL (ref 0.76–1.27)
GFR, EST AFRICAN AMERICAN: 74 mL/min/{1.73_m2} (ref >59)
GFR, EST NON AFRICAN AMERICAN: 64 mL/min/{1.73_m2} (ref >59)
Glucose: 93 mg/dL (ref 65–99)
POTASSIUM: 4.7 mmol/L (ref 3.5–5.2)
SODIUM: 140 mmol/L (ref 134–144)

## 2017-05-08 ENCOUNTER — Ambulatory Visit (INDEPENDENT_AMBULATORY_CARE_PROVIDER_SITE_OTHER): Payer: Medicare Other | Admitting: Internal Medicine

## 2017-05-08 ENCOUNTER — Encounter: Payer: Self-pay | Admitting: Internal Medicine

## 2017-05-08 VITALS — BP 126/72 | HR 58 | Temp 98.4°F | Wt 180.0 lb

## 2017-05-08 DIAGNOSIS — I10 Essential (primary) hypertension: Secondary | ICD-10-CM

## 2017-05-08 DIAGNOSIS — Z0000 Encounter for general adult medical examination without abnormal findings: Secondary | ICD-10-CM

## 2017-05-08 DIAGNOSIS — M25512 Pain in left shoulder: Secondary | ICD-10-CM

## 2017-05-08 DIAGNOSIS — G8929 Other chronic pain: Secondary | ICD-10-CM

## 2017-05-08 DIAGNOSIS — R7302 Impaired glucose tolerance (oral): Secondary | ICD-10-CM

## 2017-05-08 DIAGNOSIS — G5733 Lesion of lateral popliteal nerve, bilateral lower limbs: Secondary | ICD-10-CM | POA: Insufficient documentation

## 2017-05-08 DIAGNOSIS — E785 Hyperlipidemia, unspecified: Secondary | ICD-10-CM

## 2017-05-08 HISTORY — DX: Lesion of lateral popliteal nerve, bilateral lower limbs: G57.33

## 2017-05-08 LAB — POCT GLYCOSYLATED HEMOGLOBIN (HGB A1C): HEMOGLOBIN A1C: 5.4

## 2017-05-08 NOTE — Patient Instructions (Signed)
Your A1c was OK today  Please continue all other medications as before, and refills have been done if requested.  Please have the pharmacy call with any other refills you may need.  Please continue your efforts at being more active, low cholesterol diet, and weight control  Please keep your appointments with your specialists as you may have planned  You will be contacted regarding the referral for: Sports Medicine in this office (Dr Jordan Likes) for the left shoulder - although you can make an appt at the checkout desk as you leave  Please return in 6 months, or sooner if needed, with Lab testing done 3-5 days before

## 2017-05-08 NOTE — Addendum Note (Signed)
Addended by: Lear Ng on: 05/08/2017 10:30 AM   Modules accepted: Orders

## 2017-05-08 NOTE — Assessment & Plan Note (Signed)
With more symptoms last 2 mo, cont alleve, also refer sports medicine

## 2017-05-08 NOTE — Progress Notes (Addendum)
Subjective:    Patient ID: Ronald [name], male    DOB: 03/06/1930, 83 y.o.   MRN: [id number]  HPI  Here to f/u; overall doing ok,  Pt denies chest pain, increasing sob or doe, wheezing, orthopnea, PND, increased LE swelling, palpitations, dizziness or syncope.  Pt denies new neurological symptoms such as new headache, or facial or extremity weakness or numbness.  Pt denies polydipsia, polyuria, or low sugar episode.  Pt states overall good compliance with meds, mostly trying to follow appropriate diet, with wt overall stable,  but little exercise however. Also 2 yrs ago had left shoulder pain, did not get tx, but still intermittently painful, more recently. Has not had prior films or ortho.  Pt continues to have recurring LBP without change in severity, bowel or bladder change, fever, wt loss,  worsening LE pain/numbness/weakness, gait change or falls, better with alleve, has been following with Dr Newell Coral, and not recommended for surgury due to age. Has persistent bilat peroneal nerve impairment with drop feet.   Past Medical History:  Diagnosis Date  . ALLERGIC RHINITIS 11/25/2007  . ANXIETY 11/25/2007  . COLONIC POLYPS, HX OF 11/25/2007  . CORONARY ARTERY DISEASE 11/25/2007  . Cough 01/04/2009  . HEMORRHOIDS, INTERNAL 11/06/2010  . HOARSENESS 01/04/2009  . HYPERLIPIDEMIA 11/25/2007  . HYPERTENSION 11/25/2007  . Impaired glucose tolerance 04/21/2013  . LOW BACK PAIN 11/25/2007  . Lumbar radiculopathy, chronic    with right foot drop  . PEPTIC ULCER DISEASE 11/25/2007  . 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 Preventative health care 04/07/2011  . PSA, INCREASED 01/05/2009  . Right foot drop   . Spasmodic dysphonia   . TREMOR, ESSENTIAL 11/25/2007   Past Surgical History:  Procedure Laterality Date  . APPENDECTOMY    . BACK SURGERY    . CORONARY ARTERY BYPASS GRAFT    . s/p lumbar laminectomy and fusion    . TONSILLECTOMY      reports that he has never smoked. He has  never used smokeless tobacco. He reports that he drinks alcohol. He reports that he does not use drugs. family history includes Colon cancer in his father. No Known Allergies Current Outpatient Prescriptions on File Prior to Visit  Medication Sig Dispense Refill  . aspirin 81 MG tablet Take 81 mg by mouth daily.      [name] atorvastatin (LIPITOR) 40 MG tablet TAKE ONE TABLET BY MOUTH ONCE DAILY AT  6 PM 90 tablet 3  . b complex vitamins tablet Take 1 tablet by mouth daily.    . Cholecalciferol (VITAMIN D) 2000 UNITS CAPS Take 2,000 Units by mouth daily.    . Coenzyme Q10 (COQ10 PO) Take 1 tablet by mouth daily.     . furosemide (LASIX) 20 MG tablet Take 1 tablet (20 mg total) by mouth daily. 90 tablet 3  . irbesartan (AVAPRO) 150 MG tablet TAKE ONE TABLET BY MOUTH ONCE DAILY 90 tablet 2  . OMEGA 3 1200 MG CAPS Take 1 capsule by mouth 2 (two) times daily.     . propranolol ER (INDERAL LA) 60 MG 24 hr capsule Take 1 capsule (60 mg total) by mouth daily. 90 capsule 3   No current facility-administered medications on file prior to visit.    Review of Systems  Constitutional: Negative for other unusual diaphoresis or sweats HENT: Negative for ear discharge or swelling Eyes: Negative for other worsening visual disturbances Respiratory: Negative for stridor or other swelling  Gastrointestinal: Negative  for worsening distension or other blood Genitourinary: Negative for retention or other urinary change Musculoskeletal: Negative for other MSK pain or swelling Skin: Negative for color change or other new lesions Neurological: Negative for worsening tremors and other numbness  Psychiatric/Behavioral: Negative for worsening agitation or other fatigue All other system neg per pt    Objective:   Physical Exam BP 126/72   Pulse (!) 58   Temp 98.4 F (36.9 C) (Oral)   Wt 180 lb 0.4 oz (81.7 kg)   SpO2 98%   BMI 25.83 kg/m  VS noted,  Constitutional: Pt appears in NAD HENT: Head: NCAT.  Right  Ear: External ear normal.  Left Ear: External ear normal.  Eyes: . Pupils are equal, round, and reactive to light. Conjunctivae and EOM are normal Nose: without d/c or deformity Neck: Neck supple. Gross normal ROM Cardiovascular: Normal rate and regular rhythm.   Pulmonary/Chest: Effort normal and breath sounds without rales or wheezing.  Abd:  Soft, NT, ND, + BS, no organomegaly Left shoulder with diffuse mild tender but not the AC joint and no swelling, does have some reduced ROM; LS spine with diffuse mild to mod tender worse at the lower levels Neurological: Pt is alert. At baseline orientation, motor intact to UE o/w not done in detail Skin: Skin is warm. No rashes, other new lesions Psychiatric: Pt behavior is normal without agitation  Trace bilat leg edema No other exam findings  POCT - a1c today -  POC HgB A1c  Order: [id number]  Status:  Final result Visible to patient:  No (Not Released) Dx:  Impaired glucose tolerance  Component 10:30  Hemoglobin A1C 5.4            Assessment & Plan:

## 2017-05-08 NOTE — Assessment & Plan Note (Signed)
BP Readings from Last 3 Encounters:  05/08/17 126/72  03/11/17 (!) 144/70  12/25/16 130/80  stable overall by history and exam, recent data reviewed with pt, and pt to continue medical treatment as before,  to f/u any worsening symptoms or concerns

## 2017-05-08 NOTE — Assessment & Plan Note (Signed)
stable overall by history and exam, recent data reviewed with pt, and pt to continue medical treatment as before,  to f/u any worsening symptoms or concerns Lab Results  Component Value Date   LDLCALC 70 11/06/2016

## 2017-05-08 NOTE — Assessment & Plan Note (Signed)
stable overall by history and exam, recent data reviewed with pt, and pt to continue medical treatment as before,  to f/u any worsening symptoms or concerns Lab Results  Component Value Date   HGBA1C 6.1 11/06/2016

## 2017-05-12 ENCOUNTER — Other Ambulatory Visit: Payer: Self-pay | Admitting: Internal Medicine

## 2017-09-22 ENCOUNTER — Other Ambulatory Visit: Payer: Self-pay | Admitting: Internal Medicine

## 2017-11-12 ENCOUNTER — Other Ambulatory Visit: Payer: Self-pay | Admitting: Internal Medicine

## 2017-11-12 ENCOUNTER — Ambulatory Visit (INDEPENDENT_AMBULATORY_CARE_PROVIDER_SITE_OTHER): Payer: Medicare Other | Admitting: Internal Medicine

## 2017-11-12 ENCOUNTER — Other Ambulatory Visit (INDEPENDENT_AMBULATORY_CARE_PROVIDER_SITE_OTHER): Payer: Medicare Other

## 2017-11-12 ENCOUNTER — Encounter: Payer: Self-pay | Admitting: Internal Medicine

## 2017-11-12 VITALS — BP 126/78 | HR 67 | Temp 97.8°F | Ht 70.0 in | Wt 179.0 lb

## 2017-11-12 DIAGNOSIS — M5416 Radiculopathy, lumbar region: Secondary | ICD-10-CM

## 2017-11-12 DIAGNOSIS — I10 Essential (primary) hypertension: Secondary | ICD-10-CM | POA: Diagnosis not present

## 2017-11-12 DIAGNOSIS — R7302 Impaired glucose tolerance (oral): Secondary | ICD-10-CM

## 2017-11-12 DIAGNOSIS — E785 Hyperlipidemia, unspecified: Secondary | ICD-10-CM | POA: Diagnosis not present

## 2017-11-12 DIAGNOSIS — Z0000 Encounter for general adult medical examination without abnormal findings: Secondary | ICD-10-CM

## 2017-11-12 LAB — BASIC METABOLIC PANEL
BUN: 18 mg/dL (ref 6–23)
CO2: 28 meq/L (ref 19–32)
Calcium: 9.3 mg/dL (ref 8.4–10.5)
Chloride: 102 mEq/L (ref 96–112)
Creatinine, Ser: 1.17 mg/dL (ref 0.40–1.50)
GFR: 62.81 mL/min (ref >60.00)
GLUCOSE: 97 mg/dL (ref 70–99)
POTASSIUM: 4.9 meq/L (ref 3.5–5.1)
SODIUM: 137 meq/L (ref 135–145)

## 2017-11-12 LAB — CBC WITH DIFFERENTIAL/PLATELET
BASOS PCT: 0.8 % (ref 0.0–3.0)
Basophils Absolute: 0 10*3/uL (ref 0.0–0.1)
EOS PCT: 5.3 % — AB (ref 0.0–5.0)
Eosinophils Absolute: 0.2 10*3/uL (ref 0.0–0.7)
HCT: 34.6 % — ABNORMAL LOW (ref 39.0–52.0)
Hemoglobin: 12.1 g/dL — ABNORMAL LOW (ref 13.0–17.0)
LYMPHS ABS: 0.8 10*3/uL (ref 0.7–4.0)
Lymphocytes Relative: 18.4 % (ref 12.0–46.0)
MCHC: 34.8 g/dL (ref 30.0–36.0)
MCV: 107.4 fl — ABNORMAL HIGH (ref 78.0–100.0)
MONO ABS: 0.7 10*3/uL (ref 0.1–1.0)
Monocytes Relative: 17.1 % — ABNORMAL HIGH (ref 3.0–12.0)
NEUTROS PCT: 58.4 % (ref 43.0–77.0)
Neutro Abs: 2.5 10*3/uL (ref 1.4–7.7)
PLATELETS: 227 10*3/uL (ref 150.0–400.0)
RBC: 3.22 Mil/uL — ABNORMAL LOW (ref 4.22–5.81)
RDW: 13.2 % (ref 11.5–15.5)
WBC: 4.3 10*3/uL (ref 4.0–10.5)

## 2017-11-12 LAB — HEPATIC FUNCTION PANEL
ALT: 17 U/L (ref 0–53)
AST: 23 U/L (ref 0–37)
Albumin: 3.6 g/dL (ref 3.5–5.2)
Alkaline Phosphatase: 64 U/L (ref 39–117)
BILIRUBIN TOTAL: 1.2 mg/dL (ref 0.2–1.2)
Bilirubin, Direct: 0.2 mg/dL (ref 0.0–0.3)
Total Protein: 6.2 g/dL (ref 6.0–8.3)

## 2017-11-12 LAB — URINALYSIS, ROUTINE W REFLEX MICROSCOPIC
Bilirubin Urine: NEGATIVE
HGB URINE DIPSTICK: NEGATIVE
Ketones, ur: NEGATIVE
LEUKOCYTES UA: NEGATIVE
Nitrite: NEGATIVE
RBC / HPF: NONE SEEN (ref 0–?)
Specific Gravity, Urine: 1.01 (ref 1.000–1.030)
TOTAL PROTEIN, URINE-UPE24: NEGATIVE
Urine Glucose: NEGATIVE
Urobilinogen, UA: 0.2 (ref 0.0–1.0)
WBC UA: NONE SEEN (ref 0–?)
pH: 8 (ref 5.0–8.0)

## 2017-11-12 LAB — LIPID PANEL
CHOL/HDL RATIO: 2
Cholesterol: 121 mg/dL (ref 0–200)
HDL: 58.1 mg/dL (ref >39.00)
LDL Cholesterol: 54 mg/dL (ref 0–99)
NONHDL: 63.16
TRIGLYCERIDES: 47 mg/dL (ref 0.0–149.0)
VLDL: 9.4 mg/dL (ref 0.0–40.0)

## 2017-11-12 LAB — HEMOGLOBIN A1C: Hgb A1c MFr Bld: 6.1 % (ref 4.6–6.5)

## 2017-11-12 LAB — TSH: TSH: 2.98 u[IU]/mL (ref 0.35–4.50)

## 2017-11-12 NOTE — Patient Instructions (Signed)

## 2017-11-12 NOTE — Progress Notes (Signed)
Subjective:    Patient ID: Ronald [name], male    DOB: [birth year], 84 y.o.   MRN: [id number]  HPI  Here for wellness and f/u;  Overall doing ok;  Pt denies Chest pain, worsening SOB, DOE, wheezing, orthopnea, PND, worsening LE edema, palpitations, dizziness or syncope.  Pt denies neurological change such as new headache, facial or extremity weakness.  Pt denies polydipsia, polyuria, or low sugar symptoms. Pt states overall good compliance with treatment and medications, good tolerability, and has been trying to follow appropriate diet.  Pt denies worsening depressive symptoms, suicidal ideation or panic. No fever, night sweats, wt loss, loss of appetite, or other constitutional symptoms.  Pt states good ability with ADL's, has low fall risk, home safety reviewed and adequate, no other significant changes in hearing or vision, and not active with exercise. Admits to not talking the asa 81 mg daily recentl, but willing to restart, no recent bleeding or falls.  Also has persistent tinnitus ringing type in ears, has been trying some kind of solution he ordered from an email for last 30 days, called "911" per pt Declines need for MRI Takes white wine and viniegar drinks for foot fungus, and believes his toenail fungus is resolved, and just keeps his nails short.   Had a "woman from the insurance company" came 1 mo ago, told he had ear wax and that was his only problem.  Wife bought ear wax removal kit but he was unable to see any wax come out on irrigation.   Pt continues to have recurring LBP woithout bowel or bladder change, fever, wt loss but had had general worsening LE pain/numbness/weakness, and had gait change yesterday with intermittent loss of both legs strength with walking, but no falls.  Very careful about falling  Walks with cane on uneven ground or standing, did not bring today. Has long hx of following with Dr Nudelman/NS, seen about 6 wks ago per pt, had an xray, told he needed a laminectomy but  too high risk due to age.  No surgury offered.   Also sees Dr Illene Silver and Dr Lenore Manner   No other interval hx or new complaints Past Medical History:  Diagnosis Date  . ALLERGIC RHINITIS 11/25/2007  . ANXIETY 11/25/2007  . COLONIC POLYPS, HX OF 11/25/2007  . CORONARY ARTERY DISEASE 11/25/2007  . Cough 01/04/2009  . HEMORRHOIDS, INTERNAL 11/06/2010  . HOARSENESS 01/04/2009  . HYPERLIPIDEMIA 11/25/2007  . HYPERTENSION 11/25/2007  . Impaired glucose tolerance 04/21/2013  . LOW BACK PAIN 11/25/2007  . Lumbar radiculopathy, chronic    with right foot drop  . PEPTIC ULCER DISEASE 11/25/2007  . 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 Preventative health care 04/07/2011  . PSA, INCREASED 01/05/2009  . Right foot drop   . Spasmodic dysphonia   . TREMOR, ESSENTIAL 11/25/2007   Past Surgical History:  Procedure Laterality Date  . APPENDECTOMY    . BACK SURGERY    . CORONARY ARTERY BYPASS GRAFT    . s/p lumbar laminectomy and fusion    . TONSILLECTOMY      reports that  has never smoked. he has never used smokeless tobacco. He reports that he drinks alcohol. He reports that he does not use drugs. family history includes Colon cancer in his father. No Known Allergies Current Outpatient Medications on File Prior to Visit  Medication Sig Dispense Refill  . aspirin 81 MG tablet Take 81 mg by mouth daily.      [name]  b complex vitamins tablet Take 1 tablet by mouth daily.    . Cholecalciferol (VITAMIN D) 2000 UNITS CAPS Take 2,000 Units by mouth daily.    . Coenzyme Q10 (COQ10 PO) Take 1 tablet by mouth daily.     . irbesartan (AVAPRO) 150 MG tablet TAKE 1 TABLET BY MOUTH ONCE DAILY 90 tablet 0  . OMEGA 3 1200 MG CAPS Take 1 capsule by mouth 2 (two) times daily.     . propranolol ER (INDERAL LA) 60 MG 24 hr capsule Take 1 capsule (60 mg total) by mouth daily. 90 capsule 3  . furosemide (LASIX) 20 MG tablet Take 1 tablet (20 mg total) by mouth daily. 90 tablet 3   No current  facility-administered medications on file prior to visit.    Review of Systems Constitutional: Negative for other unusual diaphoresis, sweats, appetite or weight changes HENT: Negative for other worsening hearing loss, ear pain, facial swelling, mouth sores or neck stiffness.   Eyes: Negative for other worsening pain, redness or other visual disturbance.  Respiratory: Negative for other stridor or swelling Cardiovascular: Negative for other palpitations or other chest pain  Gastrointestinal: Negative for worsening diarrhea or loose stools, blood in stool, distention or other pain Genitourinary: Negative for hematuria, flank pain or other change in urine volume.  Musculoskeletal: Negative for myalgias or other joint swelling.  Skin: Negative for other color change, or other wound or worsening drainage.  Neurological: Negative for other syncope or numbness. Hematological: Negative for other adenopathy or swelling Psychiatric/Behavioral: Negative for hallucinations, other worsening agitation, SI, self-injury, or new decreased concentration All other system neg per pt    Objective:   Physical Exam BP 126/78   Pulse 67   Temp 97.8 F (36.6 C) (Oral)   Ht 5' 10"  (1.778 m)   Wt 179 lb (81.2 kg)   SpO2 97%   BMI 25.68 kg/m  VS noted, normal Constitutional: Pt is oriented to person, place, and time. Appears well-developed and well-nourished, in no significant distress and comfortable Head: Normocephalic and atraumatic  Eyes: Conjunctivae and EOM are normal. Pupils are equal, round, and reactive to light Right Ear: External ear normal without discharge Left Ear: External ear normal without discharge Nose: Nose without discharge or deformity Mouth/Throat: Oropharynx is without other ulcerations and moist  Neck: Normal range of motion. Neck supple. No JVD present. No tracheal deviation present or significant neck LA or mass Cardiovascular: Normal rate, regular rhythm, normal heart sounds and  intact distal pulses.   Pulmonary/Chest: WOB normal and breath sounds without rales or wheezing  Abdominal: Soft. Bowel sounds are normal. NT. No HSM  Musculoskeletal: Normal range of motion. Exhibits no edema Lymphadenopathy: Has no other cervical adenopathy.  Neurological: Pt is alert and oriented to person, place, and time. Pt has normal reflexes. No cranial nerve deficit. Motor grossly intact, Gait intact, + tremors to chin Skin: Skin is warm and dry. No rash noted or new ulcerations Psychiatric:  Has normal mood and affect. Behavior is normal without agitation No other exam findings Lab Results  Component Value Date   WBC 4.3 11/12/2017   HGB 12.1 (L) 11/12/2017   HCT 34.6 (L) 11/12/2017   PLT 227.0 11/12/2017   GLUCOSE 97 11/12/2017   CHOL 121 11/12/2017   TRIG 47.0 11/12/2017   HDL 58.10 11/12/2017   LDLCALC 54 11/12/2017   ALT 17 11/12/2017   AST 23 11/12/2017   NA 137 11/12/2017   K 4.9 11/12/2017   CL 102  11/12/2017   CREATININE 1.17 11/12/2017   BUN 18 11/12/2017   CO2 28 11/12/2017   TSH 2.98 11/12/2017   PSA 2.00 01/25/2010   HGBA1C 6.1 11/12/2017      Assessment & Plan:

## 2017-11-15 ENCOUNTER — Encounter: Payer: Self-pay | Admitting: Internal Medicine

## 2017-11-15 NOTE — Assessment & Plan Note (Signed)
stable overall by history and exam, recent data reviewed with pt, and pt to continue medical treatment as before,  to f/u any worsening symptoms or concerns BP Readings from Last 3 Encounters:  11/12/17 126/78  05/08/17 126/72  03/11/17 (!) 144/70

## 2017-11-15 NOTE — Assessment & Plan Note (Signed)
Lab Results  Component Value Date   LDLCALC 54 11/12/2017  stable overall by history and exam, recent data reviewed with pt, and pt to continue medical treatment as before,  to f/u any worsening symptoms or concerns

## 2017-11-15 NOTE — Assessment & Plan Note (Signed)
Lab Results  Component Value Date   HGBA1C 6.1 11/12/2017   stable overall by history and exam, recent data reviewed with pt, and pt to continue medical treatment as before,  to f/u any worsening symptoms or concerns 

## 2017-11-15 NOTE — Assessment & Plan Note (Signed)

## 2017-11-15 NOTE — Assessment & Plan Note (Signed)
Chronic stable, non surgical candidate, cont tx medical

## 2017-11-28 DIAGNOSIS — H2513 Age-related nuclear cataract, bilateral: Secondary | ICD-10-CM | POA: Diagnosis not present

## 2017-12-18 ENCOUNTER — Other Ambulatory Visit: Payer: Self-pay | Admitting: Internal Medicine
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
## 2017-12-25 ENCOUNTER — Ambulatory Visit: Payer: Medicare Other | Admitting: Neurology

## 2017-12-26 ENCOUNTER — Encounter: Payer: Self-pay | Admitting: Neurology

## 2017-12-26 ENCOUNTER — Ambulatory Visit: Payer: Medicare Other | Admitting: Neurology

## 2017-12-26 VITALS — BP 118/62 | HR 76 | Ht 70.0 in | Wt 174.0 lb

## 2017-12-26 DIAGNOSIS — G250 Essential tremor: Secondary | ICD-10-CM

## 2017-12-26 MED ORDER — PROPRANOLOL HCL ER 60 MG PO CP24: 60.0000 mg | ORAL_CAPSULE | Freq: Every day | ORAL | 3 refills | Status: DC

## 2017-12-26 NOTE — Patient Instructions (Signed)
Good to see you   

## 2018-03-02 ENCOUNTER — Other Ambulatory Visit: Payer: Self-pay | Admitting: Cardiovascular Disease

## 2018-03-02 NOTE — Progress Notes (Signed)
Patient ID: Ronald BowlerJohn A [name], male   DOB: 02/14/1930, 84 y.o.   MRN: [id number]  The patient is 84 y.o.  and return for management of CAD.  CABG 1998. Last cath 2003 with occluded SVG RCA Rx medically. Normal myovue 2008.   He's been doing quite well has had no recent chest pain shortness of breath or palpitations.   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   Mandibular tremor No other signs of parkinsons but no familial history of tremor Tried primadone but no help  Last visit June 2018 diuretic changed from HCTZ to Lasix for edema  Wife with lung cancer that is indolent  Activity limited by bilateral foot drop No angina    ROS: Denies fever, malais, weight loss, blurry vision, decreased visual acuity, cough, sputum, SOB, hemoptysis, pleuritic pain, palpitaitons, heartburn, abdominal pain, melena, lower extremity edema, claudication, or rash.  All other systems reviewed and negative  General: Affect appropriate Healthy:  appears stated age HEENT: normal Neck supple with no adenopathy JVP normal no bruits no thyromegaly Lungs clear with no wheezing and good diaphragmatic motion Heart:  S1/S2 no murmur, no rub, gallop or click PMI normal Abdomen: benighn, BS positve, no tenderness, no AAA no bruit.  No HSM or HJR Distal pulses intact with no bruits Plus one LE bilateral edema Neuro non-focal right facial and UE tremor Skin warm and dry Right peroneal injury with foot drop and atrophy    Current Outpatient Medications  Medication Sig Dispense Refill  . aspirin 81 MG tablet Take 81 mg by mouth daily.      [name]. atorvastatin (LIPITOR) 40 MG tablet TAKE 1 TABLET BY MOUTH ONCE DAILY 6 IN THE EVENING 90 tablet 3  . b complex vitamins tablet Take 1 tablet by mouth daily.    . Cholecalciferol (VITAMIN D) 2000 UNITS CAPS Take 2,000 Units by mouth daily.    . Coenzyme Q10 (COQ10 PO) Take 1 tablet by mouth daily.     . furosemide (LASIX) 20  MG tablet TAKE 1 TABLET BY MOUTH ONCE DAILY 30 tablet 0  . irbesartan (AVAPRO) 150 MG tablet TAKE 1 TABLET BY MOUTH ONCE DAILY 90 tablet 3  . OMEGA 3 1200 MG CAPS Take 1 capsule by mouth 2 (two) times daily.     . propranolol ER (INDERAL LA) 60 MG 24 hr capsule Take 1 capsule (60 mg total) by mouth daily. 90 capsule 3   No current facility-administered medications for this visit.     Allergies  Patient has no known allergies.  Electrocardiogram:  07/2015  SR RBBB LAD  ? Old IMI 03/11/17  SR rate 61 RBBB LAFB   Assessment and Plan  CAD/CABG:  CABG 98 cath 2003 occluded RCA graft.  Myovue 2008 non ischemic Active working out at gym everyday no symptoms continue medical Rx  HTN: Well controlled.  Continue current medications and low sodium Dash type diet.    Chol:   Cholesterol is at goal.  Continue current dose of statin and diet Rx.  No myalgias or side effects.  F/U  LFT's in 6 months. Lab Results  Component Value Date   LDLCALC 54 11/12/2017   Bifasicular block:  Stable no high grade AV block f/u ECG q 6 months         Edema:  Dependant continue lasix    F/u with me in 6 months  Dillon BjorkPeter Macio Kissoon Ramesh Moan

## 2018-03-06 ENCOUNTER — Encounter: Payer: Self-pay | Admitting: Cardiovascular Disease

## 2018-03-06 ENCOUNTER — Ambulatory Visit: Payer: Medicare Other | Admitting: Cardiovascular Disease

## 2018-03-06 VITALS — BP 122/74 | HR 73 | Ht 70.0 in | Wt 177.2 lb

## 2018-03-06 DIAGNOSIS — I25810 Atherosclerosis of coronary artery bypass graft(s) without angina pectoris: Secondary | ICD-10-CM | POA: Diagnosis not present

## 2018-03-06 DIAGNOSIS — I10 Essential (primary) hypertension: Secondary | ICD-10-CM | POA: Diagnosis not present

## 2018-03-06 DIAGNOSIS — E785 Hyperlipidemia, unspecified: Secondary | ICD-10-CM | POA: Diagnosis not present

## 2018-03-06 NOTE — Patient Instructions (Addendum)

## 2018-03-11 ENCOUNTER — Ambulatory Visit: Payer: Medicare Other | Admitting: Internal Medicine

## 2018-03-12 ENCOUNTER — Ambulatory Visit (INDEPENDENT_AMBULATORY_CARE_PROVIDER_SITE_OTHER)
Admission: RE | Admit: 2018-03-12 | Discharge: 2018-03-12 | Disposition: A | Discharge status: Home / Self Care | Payer: Medicare Other | Source: Ambulatory Visit | Attending: Family Medicine | Admitting: Family Medicine

## 2018-03-12 ENCOUNTER — Other Ambulatory Visit: Payer: Medicare Other

## 2018-03-12 ENCOUNTER — Ambulatory Visit (INDEPENDENT_AMBULATORY_CARE_PROVIDER_SITE_OTHER): Payer: Medicare Other | Admitting: Family Medicine

## 2018-03-12 ENCOUNTER — Encounter: Payer: Self-pay | Admitting: Family Medicine

## 2018-03-12 VITALS — BP 122/60 | HR 66 | Ht 70.0 in | Wt 176.0 lb

## 2018-03-12 DIAGNOSIS — M65321 Trigger finger, right index finger: Secondary | ICD-10-CM | POA: Diagnosis not present

## 2018-03-12 NOTE — Progress Notes (Signed)
Ronald [name] - 84 y.o. male MRN [id number]  Date of birth: 01-01-1930  SUBJECTIVE:  Including CC & ROS.  Chief Complaint  Patient presents with  . trigger finger    Ronald [name] is a 84 y.o. male that is presenting with right index trigger finger. Ongoing for three weeks. Admits to swelling and tenderness. Locks up intermittently, difficulties flexion. Denies injury or trauma. He had same symptoms on his left hand, he received injections which improved his pain. He hasn't tired anything for this problem.      Review of Systems  Constitutional: Negative for fever.  HENT: Negative for congestion.   Respiratory: Negative for cough.   Cardiovascular: Negative for chest pain.  Gastrointestinal: Negative for abdominal pain.  Musculoskeletal: Positive for arthralgias.  Skin: Negative for color change.  Neurological: Positive for tremors.  Hematological: Negative for adenopathy.  Psychiatric/Behavioral: Negative for agitation.    HISTORY: Past Medical, Surgical, Social, and Family History Reviewed & Updated per EMR.   Pertinent Historical Findings include:  Past Medical History:  Diagnosis Date  . ALLERGIC RHINITIS 11/25/2007  . ANXIETY 11/25/2007  . COLONIC POLYPS, HX OF 11/25/2007  . CORONARY ARTERY DISEASE 11/25/2007  . Cough 01/04/2009  . HEMORRHOIDS, INTERNAL 11/06/2010  . HOARSENESS 01/04/2009  . HYPERLIPIDEMIA 11/25/2007  . HYPERTENSION 11/25/2007  . Impaired glucose tolerance 04/21/2013  . LOW BACK PAIN 11/25/2007  . Lumbar radiculopathy, chronic    with right foot drop  . PEPTIC ULCER DISEASE 11/25/2007  . 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 Preventative health care 04/07/2011  . PSA, INCREASED 01/05/2009  . Right foot drop   . Spasmodic dysphonia   . TREMOR, ESSENTIAL 11/25/2007    Past Surgical History:  Procedure Laterality Date  . APPENDECTOMY    . BACK SURGERY    . CORONARY ARTERY BYPASS GRAFT    . s/p lumbar laminectomy and fusion    .  TONSILLECTOMY      No Known Allergies  Family History  Problem Relation Age of Onset  . Colon cancer Father        colon     Social History   Socioeconomic History  . Marital status: Married    Spouse name: Not on file  . Number of children: 3  . Years of education: Not on file  . Highest education level: Not on file  Occupational History  . Occupation: Retired    Associate Professor: RETIRED  Social Needs  . Financial resource strain: Not on file  . Food insecurity:    Worry: Not on file    Inability: Not on file  . Transportation needs:    Medical: Not on file    Non-medical: Not on file  Tobacco Use  . Smoking status: Never Smoker  . Smokeless tobacco: Never Used  Substance and Sexual Activity  . Alcohol use: Yes    Comment: Occ  . Drug use: No  . Sexual activity: Not on file  Lifestyle  . Physical activity:    Days per week: Not on file    Minutes per session: Not on file  . Stress: Not on file  Relationships  . Social connections:    Talks on phone: Not on file    Gets together: Not on file    Attends religious service: Not on file    Active member of club or organization: Not on file    Attends meetings of clubs or organizations: Not on file  Relationship status: Not on file  . Intimate partner violence:    Fear of current or ex partner: Not on file    Emotionally abused: Not on file    Physically abused: Not on file    Forced sexual activity: Not on file  Other Topics Concern  . Not on file  Social History Narrative   Son is internist in [place]     PHYSICAL EXAM:  VS: BP 122/60 (BP Location: Left Arm, Patient Position: Sitting, Cuff Size: Normal)   Pulse 66   Ht 5\' 10"  (1.778 m)   Wt 176 lb (79.8 kg)   SpO2 98%   BMI 25.25 kg/m  Physical Exam Gen: NAD, alert, cooperative with exam, well-appearing ENT: normal lips, normal nasal mucosa,  Eye: normal EOM, normal conjunctiva and lids CV:  no edema, +2 pedal pulses   Resp: no accessory muscle  use, non-labored,   Skin: no rashes, no areas of induration  Neuro: normal tone, normal sensation to touch Psych:  normal insight, alert and oriented MSK:  Right hand:  TTP of the index flexor tendon at the 2nd MTP joint  Triggering event of the index finger  Normal  Grip strength  Normal wrist ROM  Neurovascularly intact   Aspiration/Injection Procedure Note Ronald [name] 12/26/1929  Procedure: Injection Indications: right index finger pain   Procedure Details Consent: Risks of procedure as well as the alternatives and risks of each were explained to the (patient/caregiver).  Consent for procedure obtained. Time Out: Verified patient identification, verified procedure, site/side was marked, verified correct patient position, special equipment/implants available, medications/allergies/relevent history reviewed, required imaging and test results available.  Performed.  The area was cleaned with iodine and alcohol swabs.    The right trigger finger was injected using 1 cc's of 40 mg/10 mL kenalog and 1 cc's of 0.5% bupivacaine with a 25 1 1/2" needle.  Ultrasound was used. Images were obtained in  Long views showing the injection.    A sterile dressing was applied.  Patient did tolerate procedure well.       ASSESSMENT & PLAN:   Trigger index finger of right hand History of ongoing triggering. Also has a calcium depositing near the A1 pulley. Unclear if he had an injury here.  - trigger finger injection  - buddy taping  - xray  - if no improvement can try injection again.

## 2018-03-12 NOTE — Assessment & Plan Note (Signed)
History of ongoing triggering. Also has a calcium depositing near the A1 pulley. Unclear if he had an injury here.  - trigger finger injection  - buddy taping  - xray  - if no improvement can try injection again.

## 2018-03-12 NOTE — Patient Instructions (Signed)
Nice to meet you  Please try buddy taping the fingers together.  Please follow up in 4 weeks if the triggering comes back.

## 2018-04-01 ENCOUNTER — Other Ambulatory Visit: Payer: Self-pay | Admitting: Cardiovascular Disease

## 2018-05-12 ENCOUNTER — Encounter: Payer: Self-pay | Admitting: Internal Medicine

## 2018-05-12 ENCOUNTER — Ambulatory Visit (INDEPENDENT_AMBULATORY_CARE_PROVIDER_SITE_OTHER): Payer: Medicare Other | Admitting: Internal Medicine

## 2018-05-12 VITALS — BP 124/72 | HR 67 | Temp 98.3°F | Ht 70.0 in | Wt 175.0 lb

## 2018-05-12 DIAGNOSIS — R7302 Impaired glucose tolerance (oral): Secondary | ICD-10-CM

## 2018-05-12 DIAGNOSIS — E785 Hyperlipidemia, unspecified: Secondary | ICD-10-CM | POA: Diagnosis not present

## 2018-05-12 DIAGNOSIS — I10 Essential (primary) hypertension: Secondary | ICD-10-CM | POA: Diagnosis not present

## 2018-05-12 NOTE — Progress Notes (Signed)
Subjective:    Patient ID: Ronald [name], male    DOB: [birth year], 84 y.o.   MRN: [id number]  HPI    Here to f/u; overall doing ok,  Pt denies chest pain, increasing sob or doe, wheezing, orthopnea, PND, increased LE swelling, palpitations, dizziness or syncope.  Pt denies new neurological symptoms such as new headache, or facial or extremity weakness or numbness.  Pt denies polydipsia, polyuria, or low sugar episode.  Pt states overall good compliance with meds, mostly trying to follow appropriate diet, with wt overall stable,  Remains active for age. C/o tinnitus not responding to TV ad preparations, just keeps being annoying.  Also with easy bruising to arms, but none elsewhere and no overt bleeding.  Has chronic foot drop.  Mentions gets good diuresis with lasix and no worsening LE edema per Dr Eden Emms. Bps at home usually about 120 or less sbp.   Still goes to gym every other day.  No other new complaints Past Medical History:  Diagnosis Date  . ALLERGIC RHINITIS 11/25/2007  . ANXIETY 11/25/2007  . COLONIC POLYPS, HX OF 11/25/2007  . CORONARY ARTERY DISEASE 11/25/2007  . Cough 01/04/2009  . HEMORRHOIDS, INTERNAL 11/06/2010  . HOARSENESS 01/04/2009  . HYPERLIPIDEMIA 11/25/2007  . HYPERTENSION 11/25/2007  . Impaired glucose tolerance 04/21/2013  . LOW BACK PAIN 11/25/2007  . Lumbar radiculopathy, chronic    with right foot drop  . PEPTIC ULCER DISEASE 11/25/2007  . 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 Preventative health care 04/07/2011  . PSA, INCREASED 01/05/2009  . Right foot drop   . Spasmodic dysphonia   . TREMOR, ESSENTIAL 11/25/2007   Past Surgical History:  Procedure Laterality Date  . APPENDECTOMY    . BACK SURGERY    . CORONARY ARTERY BYPASS GRAFT    . s/p lumbar laminectomy and fusion    . TONSILLECTOMY      reports that he has never smoked. He has never used smokeless tobacco. He reports that he drinks alcohol. He reports that he does not use  drugs. family history includes Colon cancer in his father. No Known Allergies Current Outpatient Medications on File Prior to Visit  Medication Sig Dispense Refill  . aspirin 81 MG tablet Take 81 mg by mouth daily.      [name] atorvastatin (LIPITOR) 40 MG tablet TAKE 1 TABLET BY MOUTH ONCE DAILY 6 IN THE EVENING 90 tablet 3  . b complex vitamins tablet Take 1 tablet by mouth daily.    . Cholecalciferol (VITAMIN D) 2000 UNITS CAPS Take 2,000 Units by mouth daily.    . Coenzyme Q10 (COQ10 PO) Take 1 tablet by mouth daily.     . furosemide (LASIX) 20 MG tablet Take 1 tablet (20 mg total) by mouth daily. 90 tablet 3  . irbesartan (AVAPRO) 150 MG tablet TAKE 1 TABLET BY MOUTH ONCE DAILY 90 tablet 3  . OMEGA 3 1200 MG CAPS Take 1 capsule by mouth 2 (two) times daily.     . propranolol ER (INDERAL LA) 60 MG 24 hr capsule Take 1 capsule (60 mg total) by mouth daily. 90 capsule 3   No current facility-administered medications on file prior to visit.    Review of Systems  Constitutional: Negative for other unusual diaphoresis or sweats HENT: Negative for ear discharge or swelling Eyes: Negative for other worsening visual disturbances Respiratory: Negative for stridor or other swelling  Gastrointestinal: Negative for worsening distension or other blood Genitourinary: Negative  for retention or other urinary change Musculoskeletal: Negative for other MSK pain or swelling Skin: Negative for color change or other new lesions Neurological: Negative for worsening tremors and other numbness  Psychiatric/Behavioral: Negative for worsening agitation or other fatigue All other system neg per pt    Objective:   Physical Exam BP 124/72   Pulse 67   Temp 98.3 F (36.8 C) (Oral)   Ht 5\' 10"  (1.778 m)   Wt 175 lb (79.4 kg)   SpO2 94%   BMI 25.11 kg/m  VS noted,  Constitutional: Pt appears in NAD HENT: Head: NCAT.  Right Ear: External ear normal.  Left Ear: External ear normal.  Eyes: . Pupils are  equal, round, and reactive to light. Conjunctivae and EOM are normal Nose: without d/c or deformity Neck: Neck supple. Gross normal ROM Cardiovascular: Normal rate and regular rhythm.   Pulmonary/Chest: Effort normal and breath sounds without rales or wheezing.  Abd:  Soft, NT, ND, + BS, no organomegaly Neurological: Pt is alert. At baseline orientation, motor grossly intact Skin: Skin is warm. No rashes, other new lesions, no LE edema Psychiatric: Pt behavior is normal without agitation  No other exam findings Lab Results  Component Value Date   WBC 4.3 11/12/2017   HGB 12.1 (L) 11/12/2017   HCT 34.6 (L) 11/12/2017   PLT 227.0 11/12/2017   GLUCOSE 97 11/12/2017   CHOL 121 11/12/2017   TRIG 47.0 11/12/2017   HDL 58.10 11/12/2017   LDLCALC 54 11/12/2017   ALT 17 11/12/2017   AST 23 11/12/2017   NA 137 11/12/2017   K 4.9 11/12/2017   CL 102 11/12/2017   CREATININE 1.17 11/12/2017   BUN 18 11/12/2017   CO2 28 11/12/2017   TSH 2.98 11/12/2017   PSA 2.00 01/25/2010   HGBA1C 6.1 11/12/2017       Assessment & Plan:

## 2018-05-12 NOTE — Assessment & Plan Note (Signed)
Lab Results  Component Value Date   HGBA1C 6.1 11/12/2017   stable overall by history and exam, recent data reviewed with pt, and pt to continue medical treatment as before,  to f/u any worsening symptoms or concerns

## 2018-05-12 NOTE — Patient Instructions (Signed)
Please continue all other medications as before, and refills have been done if requested.  Please have the pharmacy call with any other refills you may need.  Please continue your efforts at being more active, low cholesterol diet, and weight control.  You are otherwise up to date with prevention measures today.  Please keep your appointments with your specialists as you may have planned  Please return in 6 months, or sooner if needed, with Lab testing done 3-5 days before  

## 2018-05-12 NOTE — Assessment & Plan Note (Signed)
stable overall by history and exam, recent data reviewed with pt, and pt to continue medical treatment as before,  to f/u any worsening symptoms or concerns BP Readings from Last 3 Encounters:  05/12/18 124/72  03/12/18 122/60  03/06/18 122/74

## 2018-05-12 NOTE — Assessment & Plan Note (Signed)
stable overall by history and exam, recent data reviewed with pt, and pt to continue medical treatment as before,  to f/u any worsening symptoms or concerns, for f/u lipids 

## 2018-11-09 ENCOUNTER — Other Ambulatory Visit: Payer: Self-pay | Admitting: Internal Medicine

## 2018-11-11 NOTE — Progress Notes (Addendum)
Subjective:   Ronald [name] is a 85 y.o. male who presents for an Initial Medicare Annual Wellness Visit.  Review of Systems  No ROS.  Medicare Wellness Visit. Additional risk factors are reflected in the social history.  Cardiac Risk Factors include: advanced age (>74men, >8 women);dyslipidemia;male gender;hypertension Sleep patterns: feels rested on waking, gets up 1 times nightly to void and sleeps 8-9 hours nightly.    Home Safety/Smoke Alarms: Feels safe in home. Smoke alarms in place.  Living environment; residence and Firearm Safety: 1-story house/ trailer, equipment: Medical laboratory scientific officer, Type: Single [employer]. Lives with wife, no needs for DME, good support system Seat Belt Safety/Bike Helmet: Wears seat belt.   PSA-  Lab Results  Component Value Date   PSA 2.00 01/25/2010   PSA 3.05 01/04/2009   PSA 1.20 11/25/2007      Objective:    Today's Vitals   11/12/18 0810  BP: (!) 144/86  Pulse: 76  SpO2: 93%  Weight: 180 lb (81.6 kg)  Height: 5\' 10"  (1.778 m)   Body mass index is 25.83 kg/m.  Advanced Directives 11/12/2018 11/06/2016  Does Patient Have a Medical Advance Directive? Yes Yes  Type of Estate agent of [place];Living will Healthcare Power of [place];Living will  Copy of Healthcare Power of Attorney in Chart? - No - copy requested    Current Medications (verified) Outpatient Encounter Medications as of 11/12/2018  Medication Sig  . aspirin 81 MG tablet Take 81 mg by mouth daily.    [name] atorvastatin (LIPITOR) 40 MG tablet TAKE 1 TABLET BY MOUTH ONCE DAILY AT  6  PM  . b complex vitamins tablet Take 1 tablet by mouth daily.  . Cholecalciferol (VITAMIN D) 2000 UNITS CAPS Take 2,000 Units by mouth daily.  . Coenzyme Q10 (COQ10 PO) Take 1 tablet by mouth daily.   . furosemide (LASIX) 20 MG tablet Take 1 tablet (20 mg total) by mouth daily.  . OMEGA 3 1200 MG CAPS Take 1 capsule by mouth 2 (two) times daily.   . propranolol ER (INDERAL LA) 60 MG 24  hr capsule Take 1 capsule (60 mg total) by mouth daily.  [name] telmisartan (MICARDIS) 40 MG tablet Take 1 tablet (40 mg total) by mouth daily.  . [DISCONTINUED] irbesartan (AVAPRO) 150 MG tablet TAKE 1 TABLET BY MOUTH ONCE DAILY   No facility-administered encounter medications on file as of 11/12/2018.     Allergies (verified) Patient has no known allergies.   History: Past Medical History:  Diagnosis Date  . ALLERGIC RHINITIS 11/25/2007  . ANXIETY 11/25/2007  . COLONIC POLYPS, HX OF 11/25/2007  . CORONARY ARTERY DISEASE 11/25/2007  . Cough 01/04/2009  . HEMORRHOIDS, INTERNAL 11/06/2010  . HOARSENESS 01/04/2009  . HYPERLIPIDEMIA 11/25/2007  . HYPERTENSION 11/25/2007  . Impaired glucose tolerance 04/21/2013  . LOW BACK PAIN 11/25/2007  . Lumbar radiculopathy, chronic    with right foot drop  . PEPTIC ULCER DISEASE 11/25/2007  . 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 Preventative health care 04/07/2011  . PSA, INCREASED 01/05/2009  . Right foot drop   . Spasmodic dysphonia   . TREMOR, ESSENTIAL 11/25/2007   Past Surgical History:  Procedure Laterality Date  . APPENDECTOMY    . BACK SURGERY    . CORONARY ARTERY BYPASS GRAFT    . s/p lumbar laminectomy and fusion    . TONSILLECTOMY     Family History  Problem Relation Age of Onset  . Colon cancer  Father        colon   Social History   Socioeconomic History  . Marital status: Married    Spouse name: Not on file  . Number of children: 3  . Years of education: Not on file  . Highest education level: Not on file  Occupational History  . Occupation: Retired    Associate Professormployer: RETIRED  Social Needs  . Financial resource strain: Not hard at all  . Food insecurity:    Worry: Never true    Inability: Never true  . Transportation needs:    Medical: No    Non-medical: No  Tobacco Use  . Smoking status: Never Smoker  . Smokeless tobacco: Never Used  Substance and Sexual Activity  . Alcohol use: Yes    Comment:  Occ  . Drug use: No  . Sexual activity: Not Currently  Lifestyle  . Physical activity:    Days per week: 5 days    Minutes per session: 40 min  . Stress: Only a little  Relationships  . Social connections:    Talks on phone: More than three times a week    Gets together: More than three times a week    Attends religious service: More than 4 times per year    Active member of club or organization: Yes    Attends meetings of clubs or organizations: More than 4 times per year    Relationship status: Married  Other Topics Concern  . Not on file  Social History Narrative   Son is internist in [place]   Tobacco Counseling Counseling given: Not Answered  Activities of Daily Living In your present state of health, do you have any difficulty performing the following activities: 11/12/2018  Hearing? N  Vision? N  Difficulty concentrating or making decisions? N  Walking or climbing stairs? N  Dressing or bathing? N  Doing errands, shopping? N  Preparing Food and eating ? N  Using the Toilet? N  In the past six months, have you accidently leaked urine? N  Do you have problems with loss of bowel control? N  Managing your Medications? N  Managing your Finances? N  Housekeeping or managing your Housekeeping? N  Some recent data might be hidden     Immunizations and Health Maintenance Immunization History  Administered Date(s) Administered  . H1N1 06/16/2008  . Influenza Split 05/28/2013  . Influenza,inj,Quad PF,6+ Mos 06/21/2014, 05/07/2016  . Influenza-Unspecified 06/15/2015  . Pneumococcal Conjugate-13 04/28/2014  . Pneumococcal Polysaccharide-23 09/16/2004, 04/16/2011  . Td 01/04/2009  . Zoster 08/17/2013   There are no preventive care reminders to display for this patient.  Patient Care Team: Corwin LevinsJohn, James W, MD as PCP - General Eden EmmsNishan, Noralyn PickPeter C, MD as PCP - Cardiology (Cardiology) Tat, Octaviano Battyebecca S, DO as Consulting Physician (Neurology)  Indicate any recent Medical  Services you may have received from other than [facility] providers in the past year (date may be approximate).    Assessment:   This is a routine wellness examination for Ronald [name]. Physical assessment deferred to PCP.  Hearing/Vision screen Hearing Screening Comments: Able to hear conversational tones w/o difficulty. No issues reported. Passed whisper test  Vision Screening Comments: appointment yearly Dr. Hazle Quantigby  Dietary issues and exercise activities discussed: Current Exercise Habits: Home exercise routine, Type of exercise: calisthenics(chair exercises and stationary bike), Time (Minutes): 40, Frequency (Times/Week): 6, Weekly Exercise (Minutes/Week): 240, Intensity: Mild, Exercise limited by: None identified  Diet (meal preparation, eat out, water intake, caffeinated beverages, dairy products,  fruits and vegetables): in general, a "healthy" diet  , well balanced. eats a variety of fruits and vegetables daily, limits salt, fat/cholesterol, sugar,carbohydrates,caffeine, drinks 6-8 glasses of water daily.  Goals    . maintaining current status of mobitlity     I intend to keep going to the gym and working with my wife in our garden. Continuing social activities with my family.    . Patient Stated     Maintain current health status and stay as independent as possible. Continue close relationships with all of my family members.      Depression Screen PHQ 2/9 Scores 11/12/2018 11/12/2018 11/12/2017 11/06/2016  PHQ - 2 Score 0 0 0 0  PHQ- 9 Score - - 0 -    Fall Risk Fall Risk  11/12/2018 12/26/2017 11/12/2017 11/12/2017 12/25/2016  Falls in the past year? 0 No No No No  Number falls in past yr: - - - - -  Comment - - - - -  Injury with Fall? - - - - -   Cognitive Function: MMSE - Mini Mental State Exam 11/06/2016  Orientation to time 5  Orientation to Place 5  Registration 3  Attention/ Calculation 5  Recall 2  Language- name 2 objects 2  Language- repeat 1  Language- follow 3 step command 3   Language- read & follow direction 1  Write a sentence 1  Copy design 1  Total score 29       Ad8 score reviewed for issues:  Issues making decisions: no  Less interest in hobbies / activities: no  Repeats questions, stories (family complaining): no  Trouble using ordinary gadgets (microwave, computer, phone):no  Forgets the month or year: no  Mismanaging finances: no  Remembering appts: no  Daily problems with thinking and/or memory: yes Ad8 score is= 1, patient states he has no other cognitive issues other than STM. Screening Tests Health Maintenance  Topic Date Due  . INFLUENZA VACCINE  12/16/2018 (Originally 04/16/2018)  . TETANUS/TDAP  01/05/2019  . PNA vac Low Risk Adult  Completed      Plan:      Reviewed health maintenance screenings with patient today and relevant education, vaccines, and/or referrals were provided.   Continue doing brain stimulating activities (puzzles, reading, adult coloring books, staying active) to keep memory sharp.   Continue to eat heart healthy diet (full of fruits, vegetables, whole grains, lean protein, water--limit salt, fat, and sugar intake) and increase physical activity as tolerated.  I have personally reviewed and noted the following in the patient's chart:   . Medical and social history . Use of alcohol, tobacco or illicit drugs  . Current medications and supplements . Functional ability and status . Nutritional status . Physical activity . Advanced directives . List of other physicians . Vitals . Screenings to include cognitive, depression, and falls . Referrals and appointments  In addition, I have reviewed and discussed with patient certain preventive protocols, quality metrics, and best practice recommendations. A written personalized care plan for preventive services as well as general preventive health recommendations were provided to patient.     Wanda Plump, RN   11/12/2018    Medical screening  examination/treatment/procedure(s) were performed by non-physician practitioner and as supervising physician I was immediately available for consultation/collaboration. I agree with above. Oliver Barre, MD

## 2018-11-12 ENCOUNTER — Other Ambulatory Visit (INDEPENDENT_AMBULATORY_CARE_PROVIDER_SITE_OTHER): Payer: Medicare Other

## 2018-11-12 ENCOUNTER — Ambulatory Visit (INDEPENDENT_AMBULATORY_CARE_PROVIDER_SITE_OTHER): Payer: Medicare Other | Admitting: Internal Medicine

## 2018-11-12 ENCOUNTER — Ambulatory Visit (INDEPENDENT_AMBULATORY_CARE_PROVIDER_SITE_OTHER): Payer: Medicare Other | Admitting: *Deleted

## 2018-11-12 ENCOUNTER — Encounter: Payer: Self-pay | Admitting: Internal Medicine

## 2018-11-12 VITALS — BP 144/86 | HR 76 | Ht 70.0 in | Wt 180.0 lb

## 2018-11-12 VITALS — BP 144/86 | HR 76 | Temp 97.7°F | Ht 70.0 in | Wt 180.0 lb

## 2018-11-12 DIAGNOSIS — Z0001 Encounter for general adult medical examination with abnormal findings: Secondary | ICD-10-CM | POA: Diagnosis not present

## 2018-11-12 DIAGNOSIS — I10 Essential (primary) hypertension: Secondary | ICD-10-CM | POA: Diagnosis not present

## 2018-11-12 DIAGNOSIS — E785 Hyperlipidemia, unspecified: Secondary | ICD-10-CM

## 2018-11-12 DIAGNOSIS — Z0000 Encounter for general adult medical examination without abnormal findings: Secondary | ICD-10-CM

## 2018-11-12 DIAGNOSIS — R7302 Impaired glucose tolerance (oral): Secondary | ICD-10-CM | POA: Diagnosis not present

## 2018-11-12 DIAGNOSIS — R21 Rash and other nonspecific skin eruption: Secondary | ICD-10-CM | POA: Diagnosis not present

## 2018-11-12 LAB — CBC WITH DIFFERENTIAL/PLATELET
Basophils Absolute: 0 10*3/uL (ref 0.0–0.1)
Basophils Relative: 0.5 % (ref 0.0–3.0)
Eosinophils Absolute: 0.2 10*3/uL (ref 0.0–0.7)
Eosinophils Relative: 4.5 % (ref 0.0–5.0)
HCT: 35.4 % — ABNORMAL LOW (ref 39.0–52.0)
Hemoglobin: 12.3 g/dL — ABNORMAL LOW (ref 13.0–17.0)
Lymphocytes Relative: 20.3 % (ref 12.0–46.0)
Lymphs Abs: 0.9 10*3/uL (ref 0.7–4.0)
MCHC: 34.8 g/dL (ref 30.0–36.0)
MCV: 108.7 fl — ABNORMAL HIGH (ref 78.0–100.0)
Monocytes Absolute: 0.7 10*3/uL (ref 0.1–1.0)
Monocytes Relative: 15.3 % — ABNORMAL HIGH (ref 3.0–12.0)
Neutro Abs: 2.6 10*3/uL (ref 1.4–7.7)
Neutrophils Relative %: 59.4 % (ref 43.0–77.0)
Platelets: 222 10*3/uL (ref 150.0–400.0)
RBC: 3.26 Mil/uL — AB (ref 4.22–5.81)
RDW: 13.7 % (ref 11.5–15.5)
WBC: 4.4 10*3/uL (ref 4.0–10.5)

## 2018-11-12 LAB — LIPID PANEL
Cholesterol: 124 mg/dL (ref 0–200)
HDL: 57.8 mg/dL (ref >39.00)
LDL CALC: 55 mg/dL (ref 0–99)
NonHDL: 66.47
Total CHOL/HDL Ratio: 2
Triglycerides: 55 mg/dL (ref 0.0–149.0)
VLDL: 11 mg/dL (ref 0.0–40.0)

## 2018-11-12 LAB — HEPATIC FUNCTION PANEL
ALT: 20 U/L (ref 0–53)
AST: 25 U/L (ref 0–37)
Albumin: 3.9 g/dL (ref 3.5–5.2)
Alkaline Phosphatase: 62 U/L (ref 39–117)
Bilirubin, Direct: 0.2 mg/dL (ref 0.0–0.3)
TOTAL PROTEIN: 6.3 g/dL (ref 6.0–8.3)
Total Bilirubin: 1.1 mg/dL (ref 0.2–1.2)

## 2018-11-12 LAB — URINALYSIS, ROUTINE W REFLEX MICROSCOPIC
Bilirubin Urine: NEGATIVE
Hgb urine dipstick: NEGATIVE
Ketones, ur: NEGATIVE
Leukocytes,Ua: NEGATIVE
Nitrite: NEGATIVE
SPECIFIC GRAVITY, URINE: 1.015 (ref 1.000–1.030)
Total Protein, Urine: NEGATIVE
Urine Glucose: NEGATIVE
Urobilinogen, UA: 0.2 (ref 0.0–1.0)
pH: 7.5 (ref 5.0–8.0)

## 2018-11-12 LAB — BASIC METABOLIC PANEL
BUN: 23 mg/dL (ref 6–23)
CO2: 31 mEq/L (ref 19–32)
Calcium: 9.3 mg/dL (ref 8.4–10.5)
Chloride: 103 mEq/L (ref 96–112)
Creatinine, Ser: 1.15 mg/dL (ref 0.40–1.50)
GFR: 60.15 mL/min (ref >60.00)
Glucose, Bld: 102 mg/dL — ABNORMAL HIGH (ref 70–99)
Potassium: 4.8 mEq/L (ref 3.5–5.1)
Sodium: 139 mEq/L (ref 135–145)

## 2018-11-12 LAB — TSH: TSH: 3.78 u[IU]/mL (ref 0.35–4.50)

## 2018-11-12 MED ORDER — TELMISARTAN 40 MG PO TABS: 40.0000 mg | ORAL_TABLET | Freq: Every day | ORAL | 3 refills | Status: DC

## 2018-11-12 MED ORDER — IRBESARTAN 150 MG PO TABS: 150.0000 mg | ORAL_TABLET | Freq: Every day | ORAL | 1 refills | Status: DC

## 2018-11-12 MED ORDER — CLOTRIMAZOLE-BETAMETHASONE 1-0.05 % EX CREA: TOPICAL_CREAM | CUTANEOUS | 1 refills | Status: DC

## 2018-11-12 NOTE — Assessment & Plan Note (Addendum)
To change irbesartan to telmisartan 40 qd due to recall, o/w stable overall by history and exam, recent data reviewed with pt, and pt to continue medical treatment as before,  to f/u any worsening symptoms or concerns

## 2018-11-12 NOTE — Addendum Note (Signed)
Addended by: Roney Mans on: 11/12/2018 09:48 AM   Modules accepted: Orders

## 2018-11-12 NOTE — Patient Instructions (Signed)
Please take all new medication as prescribed - the cream for the foot rash  OK to change the irbesaran to telmisartan 40 mg per day  Please continue to monitor your blood pressure on a regular basis, with the goal being to be less than 140/90  Please continue all other medications as before, and refills have been done if requested.  Please have the pharmacy call with any other refills you may need.  Please continue your efforts at being more active, low cholesterol diet, and weight control.  You are otherwise up to date with prevention measures today.  Please keep your appointments with your specialists as you may have planned  Please go to the LAB in the Basement (turn left off the elevator) for the tests to be done today  You will be contacted by phone if any changes need to be made immediately.  Otherwise, you will receive a letter about your results with an explanation, but please check with MyChart first.  Please remember to sign up for MyChart if you have not done so, as this will be important to you in the future with finding out test results, communicating by private email, and scheduling acute appointments online when needed.  Please return in 6 months, or sooner if needed

## 2018-11-12 NOTE — Assessment & Plan Note (Signed)
stable overall by history and exam, recent data reviewed with pt, and pt to continue medical treatment as before,  to f/u any worsening symptoms or concerns, for lipids with labs 

## 2018-11-12 NOTE — Patient Instructions (Signed)
Continue doing brain stimulating activities (puzzles, reading, adult coloring books, staying active) to keep memory sharp.   Continue to eat heart healthy diet (full of fruits, vegetables, whole grains, lean protein, water--limit salt, fat, and sugar intake) and increase physical activity as tolerated.   [name] , Thank you for taking time to come for your Medicare Wellness Visit. I appreciate your ongoing commitment to your health goals. Please review the following plan we discussed and let me know if I can assist you in the future.   These are the goals we discussed: Goals    . maintaining current status of mobitlity     I intend to keep going to the gym and working with my wife in our garden. Continuing social activities with my family.    . Patient Stated     Maintain current health status and stay as independent as possible. Continue close relationships with all of my family members.       This is a list of the screening recommended for you and due dates:  Health Maintenance  Topic Date Due  . Flu Shot  12/16/2018*  . Tetanus Vaccine  01/05/2019  . Pneumonia vaccines  Completed  *Topic was postponed. The date shown is not the original due date.    Preventive Care 45 Years and Older, Male Preventive care refers to lifestyle choices and visits with your health care provider that can promote health and wellness. What does preventive care include?   A yearly physical exam. This is also called an annual well check.  Dental exams once or twice a year.  Routine eye exams. Ask your health care provider how often you should have your eyes checked.  Personal lifestyle choices, including: ? Daily care of your teeth and gums. ? Regular physical activity. ? Eating a healthy diet. ? Avoiding tobacco and drug use. ? Limiting alcohol use. ? Practicing safe sex. ? Taking low doses of aspirin every day. ? Taking vitamin and mineral supplements as recommended by your health care  provider. What happens during an annual well check? The services and screenings done by your health care provider during your annual well check will depend on your age, overall health, lifestyle risk factors, and family history of disease. Counseling Your health care provider may ask you questions about your:  Alcohol use.  Tobacco use.  Drug use.  Emotional well-being.  Home and relationship well-being.  Sexual activity.  Eating habits.  History of falls.  Memory and ability to understand (cognition).  Work and work Statistician. Screening You may have the following tests or measurements:  Height, weight, and BMI.  Blood pressure.  Lipid and cholesterol levels. These may be checked every 5 years, or more frequently if you are over 44 years old.  Skin check.  Lung cancer screening. You may have this screening every year starting at age 77 if you have a 30-pack-year history of smoking and currently smoke or have quit within the past 15 years.  Colorectal cancer screening. All adults should have this screening starting at age 96 and continuing until age 29. You will have tests every 1-10 years, depending on your results and the type of screening test. People at increased risk should start screening at an earlier age. Screening tests may include: ? Guaiac-based fecal occult blood testing. ? Fecal immunochemical test (FIT). ? Stool DNA test. ? Virtual colonoscopy. ? Sigmoidoscopy. During this test, a flexible tube with a tiny camera (sigmoidoscope) is used to examine your  rectum and lower colon. The sigmoidoscope is inserted through your anus into your rectum and lower colon. ? Colonoscopy. During this test, a long, thin, flexible tube with a tiny camera (colonoscope) is used to examine your entire colon and rectum.  Prostate cancer screening. Recommendations will vary depending on your family history and other risks.  Hepatitis C blood test.  Hepatitis B blood  test.  Sexually transmitted disease (STD) testing.  Diabetes screening. This is done by checking your blood sugar (glucose) after you have not eaten for a while (fasting). You may have this done every 1-3 years.  Abdominal aortic aneurysm (AAA) screening. You may need this if you are a current or former smoker.  Osteoporosis. You may be screened starting at age 68 if you are at high risk. Talk with your health care provider about your test results, treatment options, and if necessary, the need for more tests. Vaccines Your health care provider may recommend certain vaccines, such as:  Influenza vaccine. This is recommended every year.  Tetanus, diphtheria, and acellular pertussis (Tdap, Td) vaccine. You may need a Td booster every 10 years.  Varicella vaccine. You may need this if you have not been vaccinated.  Zoster vaccine. You may need this after age 2.  Measles, mumps, and rubella (MMR) vaccine. You may need at least one dose of MMR if you were born in 1957 or later. You may also need a second dose.  Pneumococcal 13-valent conjugate (PCV13) vaccine. One dose is recommended after age 59.  Pneumococcal polysaccharide (PPSV23) vaccine. One dose is recommended after age 50.  Meningococcal vaccine. You may need this if you have certain conditions.  Hepatitis A vaccine. You may need this if you have certain conditions or if you travel or work in places where you may be exposed to hepatitis A.  Hepatitis B vaccine. You may need this if you have certain conditions or if you travel or work in places where you may be exposed to hepatitis B.  Haemophilus influenzae type b (Hib) vaccine. You may need this if you have certain risk factors. Talk to your health care provider about which screenings and vaccines you need and how often you need them. This information is not intended to replace advice given to you by your health care provider. Make sure you discuss any questions you have with  your health care provider. Document Released: 09/29/2015 Document Revised: 10/23/2017 Document Reviewed: 07/04/2015 Elsevier Interactive Patient Education  2019 [employer].

## 2018-11-12 NOTE — Assessment & Plan Note (Signed)
C/w likely fungal - for lotrosone prn

## 2018-11-12 NOTE — Assessment & Plan Note (Signed)
stable overall by history and exam, recent data reviewed with pt, and pt to continue medical treatment as before,  to f/u any worsening symptoms or concerns, for a1c with labs 

## 2018-11-12 NOTE — Progress Notes (Signed)
Subjective:    Patient ID: Ronald [name], male    DOB: [birth year], 85 y.o.   MRN: [id number]  HPI  Here for wellness and f/u;  Overall doing ok;  Pt denies Chest pain, worsening SOB, DOE, wheezing, orthopnea, PND, worsening LE edema, palpitations, dizziness or syncope.  Pt denies neurological change such as new headache, facial or extremity weakness.  Pt denies polydipsia, polyuria, or low sugar symptoms. Pt states overall good compliance with treatment and medications, good tolerability, and has been trying to follow appropriate diet.  Pt denies worsening depressive symptoms, suicidal ideation or panic. No fever, night sweats, wt loss, loss of appetite, or other constitutional symptoms.  Pt states good ability with ADL's, has low fall risk, home safety reviewed and adequate, no other significant changes in hearing or vision, and only occasionally active with exercise. Currently out of the irbesartan, and traffic bad getting here.    Has a non slightly itchy enlarging rash to the left dorsal foot, no pain, fever or swelling Past Medical History:  Diagnosis Date  . ALLERGIC RHINITIS 11/25/2007  . ANXIETY 11/25/2007  . COLONIC POLYPS, HX OF 11/25/2007  . CORONARY ARTERY DISEASE 11/25/2007  . Cough 01/04/2009  . HEMORRHOIDS, INTERNAL 11/06/2010  . HOARSENESS 01/04/2009  . HYPERLIPIDEMIA 11/25/2007  . HYPERTENSION 11/25/2007  . Impaired glucose tolerance 04/21/2013  . LOW BACK PAIN 11/25/2007  . Lumbar radiculopathy, chronic    with right foot drop  . PEPTIC ULCER DISEASE 11/25/2007  . 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 Preventative health care 04/07/2011  . PSA, INCREASED 01/05/2009  . Right foot drop   . Spasmodic dysphonia   . TREMOR, ESSENTIAL 11/25/2007   Past Surgical History:  Procedure Laterality Date  . APPENDECTOMY    . BACK SURGERY    . CORONARY ARTERY BYPASS GRAFT    . s/p lumbar laminectomy and fusion    . TONSILLECTOMY      reports that he has never  smoked. He has never used smokeless tobacco. He reports current alcohol use. He reports that he does not use drugs. family history includes Colon cancer in his father. No Known Allergies Current Outpatient Medications on File Prior to Visit  Medication Sig Dispense Refill  . aspirin 81 MG tablet Take 81 mg by mouth daily.      [name] atorvastatin (LIPITOR) 40 MG tablet TAKE 1 TABLET BY MOUTH ONCE DAILY AT  6  PM 90 tablet 0  . b complex vitamins tablet Take 1 tablet by mouth daily.    . Cholecalciferol (VITAMIN D) 2000 UNITS CAPS Take 2,000 Units by mouth daily.    . Coenzyme Q10 (COQ10 PO) Take 1 tablet by mouth daily.     . furosemide (LASIX) 20 MG tablet Take 1 tablet (20 mg total) by mouth daily. 90 tablet 3  . OMEGA 3 1200 MG CAPS Take 1 capsule by mouth 2 (two) times daily.     . propranolol ER (INDERAL LA) 60 MG 24 hr capsule Take 1 capsule (60 mg total) by mouth daily. 90 capsule 3   No current facility-administered medications on file prior to visit.    Review of Systems Constitutional: Negative for other unusual diaphoresis, sweats, appetite or weight changes HENT: Negative for other worsening hearing loss, ear pain, facial swelling, mouth sores or neck stiffness.   Eyes: Negative for other worsening pain, redness or other visual disturbance.  Respiratory: Negative for other stridor or swelling Cardiovascular: Negative for other  palpitations or other chest pain  Gastrointestinal: Negative for worsening diarrhea or loose stools, blood in stool, distention or other pain Genitourinary: Negative for hematuria, flank pain or other change in urine volume.  Musculoskeletal: Negative for myalgias or other joint swelling.  Skin: Negative for other color change, or other wound or worsening drainage.  Neurological: Negative for other syncope or numbness. Hematological: Negative for other adenopathy or swelling Psychiatric/Behavioral: Negative for hallucinations, other worsening agitation, SI,  self-injury, or new decreased concentration ALl other system neg per pt    Objective:   Physical Exam BP (!) 144/86   Pulse 76   Temp 97.7 F (36.5 C) (Oral)   Ht 5\' 10"  (1.778 m)   Wt 180 lb (81.6 kg)   SpO2 93%   BMI 25.83 kg/m  VS noted,  Constitutional: Pt is oriented to person, place, and time. Appears well-developed and well-nourished, in no significant distress and comfortable Head: Normocephalic and atraumatic  Eyes: Conjunctivae and EOM are normal. Pupils are equal, round, and reactive to light Right Ear: External ear normal without discharge Left Ear: External ear normal without discharge Nose: Nose without discharge or deformity Mouth/Throat: Oropharynx is without other ulcerations and moist  Neck: Normal range of motion. Neck supple. No JVD present. No tracheal deviation present or significant neck LA or mass Cardiovascular: Normal rate, regular rhythm, normal heart sounds and intact distal pulses.   Pulmonary/Chest: WOB normal and breath sounds without rales or wheezing  Abdominal: Soft. Bowel sounds are normal. NT. No HSM  Musculoskeletal: Normal range of motion. Exhibits no edema Lymphadenopathy: Has no other cervical adenopathy.  Neurological: Pt is alert and oriented to person, place, and time. Pt has normal reflexes. No cranial nerve deficit. Motor grossly intact except left > right bilat drop foot, Gait with some imbalance Skin: Skin is warm and dry. No rash noted or new ulcerations Psychiatric:  Has normal mood and affect. Behavior is normal without agitation No other exam findings Lab Results  Component Value Date   WBC 4.3 11/12/2017   HGB 12.1 (L) 11/12/2017   HCT 34.6 (L) 11/12/2017   PLT 227.0 11/12/2017   GLUCOSE 97 11/12/2017   CHOL 121 11/12/2017   TRIG 47.0 11/12/2017   HDL 58.10 11/12/2017   LDLCALC 54 11/12/2017   ALT 17 11/12/2017   AST 23 11/12/2017   NA 137 11/12/2017   K 4.9 11/12/2017   CL 102 11/12/2017   CREATININE 1.17 11/12/2017     BUN 18 11/12/2017   CO2 28 11/12/2017   TSH 2.98 11/12/2017   PSA 2.00 01/25/2010   HGBA1C 6.1 11/12/2017       Assessment & Plan:

## 2018-12-28 ENCOUNTER — Ambulatory Visit: Payer: Medicare Other | Admitting: Neurology

## 2018-12-29 ENCOUNTER — Telehealth: Payer: Self-pay | Admitting: Neurology

## 2018-12-29 NOTE — Telephone Encounter (Signed)
Has been a year since his last appt.  Needs visit.  Offer evisit

## 2018-12-29 NOTE — Telephone Encounter (Signed)
Please set him up for a visit.

## 2018-12-29 NOTE — Telephone Encounter (Signed)
Patient is scheduled for telephone visit next week 4/23- he did not have technology for Evisit. He doesn't have anyone to help him. He only has enough medication to last until Monday. Was wondering if you could sent refill to last until tele appt. Thanks!

## 2018-12-29 NOTE — Telephone Encounter (Signed)
You can send enough for 30 days but there are plenty of places for phone visit next week.  Please schedule

## 2018-12-29 NOTE — Telephone Encounter (Signed)
Propranolol medication refill to the pharm on file. Thanks!

## 2018-12-30 ENCOUNTER — Other Ambulatory Visit: Payer: Self-pay | Admitting: *Deleted

## 2018-12-30 MED ORDER — PROPRANOLOL HCL ER 60 MG PO CP24: 60.0000 mg | ORAL_CAPSULE | Freq: Every day | ORAL | 0 refills | Status: DC

## 2018-12-30 NOTE — Telephone Encounter (Signed)
#  30 sent.

## 2018-12-31 ENCOUNTER — Other Ambulatory Visit: Payer: Self-pay | Admitting: Neurology

## 2019-01-04 ENCOUNTER — Telehealth: Payer: Self-pay | Admitting: Internal Medicine

## 2019-01-04 NOTE — Telephone Encounter (Signed)
Called pt. Back. Reviewed his medications with him and he was given his next appointment. Verbalized understanding.

## 2019-01-04 NOTE — Telephone Encounter (Signed)
Please call this patient he has some questions about what -statin medication he should be taking. He has currently been taking three and he also wants to know when is his next scheduled appointment.   [phone]

## 2019-01-05 NOTE — Progress Notes (Signed)
    Virtual Visit via Telephone Note The purpose of this virtual visit is to provide medical care while limiting exposure to the novel coronavirus.    Consent was obtained for phone visit:  Yes.   Answered questions that patient had about telehealth interaction:  Yes.   I discussed the limitations, risks, security and privacy concerns of performing an evaluation and management service by telephone. I also discussed with the patient that there may be a patient responsible charge related to this service. The patient expressed understanding and agreed to proceed.  Pt location: Home Physician Location: office Name of referring provider:  [name], MD I connected with .Ernst Bowler at patients initiation/request on 01/07/2019 at  9:00 AM EDT by telephone and verified that I am speaking with the correct person using two identifiers.  Pt MRN:  [id number] Pt DOB:  [birth year]   History of Present Illness:  Patient is seen today in follow-up for essential tremor.  Patient is on propranolol LA, 60 mg daily.  Over the past year, his tremor has been stable.  "its the most important medication that I take."  "I think that I am in good shape."  Medical records are reviewed since last visit.  Last saw primary care in February 27th, 2020.  His blood pressure medication was changed just because of a recall of his blood pressure medication, but otherwise no other changes were made.  Is working out daily.  "the only problem I have is with my feet because I broke my back in college wrestling and I have had problems ever since."   Observations/Objective:   Unable, phone  Assessment and Plan:   1.  Essential tremor  -Stable, continue Inderal LA, 60 mg daily.  Understands risk, benefits, and side effects.  Because he was not in the office today and did not have the capability at home, I did not have vitals.  He understands that there are risks associated with that as well.  I did review vitals from his primary  care.  -Discussed in detail Covid-19 and risk factors for this, including age and medical problems.  Discussed importance of social distancing.  Discussed importance of staying home at all times, as is feasible.  Discussed taking advantage of grocery store hours for the elderly.   Family is grocery shopping for pt and wife.   Pt expressed understanding.  Follow Up Instructions: F/u 1 year    -I discussed the assessment and treatment plan with the patient. The patient was provided an opportunity to ask questions and all were answered. The patient agreed with the plan and demonstrated an understanding of the instructions.   The patient was advised to call back or seek an in-person evaluation if the symptoms worsen or if the condition fails to improve as anticipated.    Total Time spent in visit with the patient was:  12 min, of which 100% of the time was spent in counseling and/or coordinating care on safety.   Pt understands and agrees with the plan of care outlined.     Kerin Salen, DO

## 2019-01-06 ENCOUNTER — Other Ambulatory Visit: Payer: Self-pay | Admitting: General Surgery

## 2019-01-07 ENCOUNTER — Telehealth (INDEPENDENT_AMBULATORY_CARE_PROVIDER_SITE_OTHER): Payer: Medicare Other | Admitting: Neurology

## 2019-01-07 ENCOUNTER — Other Ambulatory Visit: Payer: Self-pay

## 2019-01-07 ENCOUNTER — Encounter: Payer: Self-pay | Admitting: Neurology

## 2019-01-07 DIAGNOSIS — G250 Essential tremor: Secondary | ICD-10-CM | POA: Diagnosis not present

## 2019-01-07 MED ORDER — PROPRANOLOL HCL ER 60 MG PO CP24: 60.0000 mg | ORAL_CAPSULE | Freq: Every day | ORAL | 3 refills | Status: DC

## 2019-02-07 ENCOUNTER — Other Ambulatory Visit: Payer: Self-pay | Admitting: Internal Medicine

## 2019-03-01 ENCOUNTER — Telehealth: Payer: Self-pay

## 2019-03-01 NOTE — Telephone Encounter (Signed)
Virtual Visit Pre-Appointment Phone Call  "(Name), I am calling you today to discuss your upcoming appointment. We are currently trying to limit exposure to the virus that causes COVID-19 by seeing patients at home rather than in the office."  1. "What is the BEST phone number to call the day of the visit?" - include this in appointment notes  2. "Do you have or have access to (through a family member/friend) a smartphone with video capability that we can use for your visit?" a. If yes - list this number in appt notes as "cell" (if different from BEST phone #) and list the appointment type as a VIDEO visit in appointment notes b. If no - list the appointment type as a PHONE visit in appointment notes  3. Confirm consent - "In the setting of the current Covid19 crisis, you are scheduled for a (phone or video) visit with your provider on (date) at (time).  Just as we do with many in-office visits, in order for you to participate in this visit, we must obtain consent.  If you'd like, I can send this to your mychart (if signed up) or email for you to review.  Otherwise, I can obtain your verbal consent now.  All virtual visits are billed to your insurance company just like a normal visit would be.  By agreeing to a virtual visit, we'd like you to understand that the technology does not allow for your provider to perform an examination, and thus may limit your provider's ability to fully assess your condition. If your provider identifies any concerns that need to be evaluated in person, we will make arrangements to do so.  Finally, though the technology is pretty good, we cannot assure that it will always work on either your or our end, and in the setting of a video visit, we may have to convert it to a phone-only visit.  In either situation, we cannot ensure that we have a secure connection.  Are you willing to proceed?YES  4. Advise patient to be prepared - "Two hours prior to your appointment, go  ahead and check your blood pressure, pulse, oxygen saturation, and your weight (if you have the equipment to check those) and write them all down. When your visit starts, your provider will ask you for this information. If you have an Apple Watch or Kardia device, please plan to have heart rate information ready on the day of your appointment. Please have a pen and paper handy nearby the day of the visit as well."  5. Give patient instructions for MyChart download to smartphone OR [email or web address] as below if video visit (depending on what platform provider is using)  6. Inform patient they will receive a phone call 15 minutes prior to their appointment time (may be from unknown caller ID) so they should be prepared to answer    TELEPHONE CALL NOTE  Ronald [name] has been deemed a candidate for a follow-up tele-health visit to limit community exposure during the Covid-19 pandemic. I spoke with the patient via phone to ensure availability of phone/video source, confirm preferred email & phone number, and discuss instructions and expectations.  I reminded Ronald [name] to be prepared with any vital sign and/or heart rhythm information that could potentially be obtained via home monitoring, at the time of his visit. I reminded Ronald [name] to expect a phone call prior to his visit.  Michaelyn Barter, RN 03/01/2019 1:34 PM  FULL LENGTH CONSENT FOR TELE-HEALTH VISIT   I hereby voluntarily request, consent and authorize [facility] and its employed or contracted physicians, physician assistants, nurse practitioners or other licensed health care professionals (the Practitioner), to provide me with telemedicine health care services (the "Services") as deemed necessary by the treating Practitioner. I acknowledge and consent to receive the Services by the Practitioner via telemedicine. I understand that the telemedicine visit will involve communicating with the Practitioner through live  audiovisual communication technology and the disclosure of certain medical information by electronic transmission. I acknowledge that I have been given the opportunity to request an in-person assessment or other available alternative prior to the telemedicine visit and am voluntarily participating in the telemedicine visit.  I understand that I have the right to withhold or withdraw my consent to the use of telemedicine in the course of my care at any time, without affecting my right to future care or treatment, and that the Practitioner or I may terminate the telemedicine visit at any time. I understand that I have the right to inspect all information obtained and/or recorded in the course of the telemedicine visit and may receive copies of available information for a reasonable fee.  I understand that some of the potential risks of receiving the Services via telemedicine include:  [name] Delay or interruption in medical evaluation due to technological equipment failure or disruption; . Information transmitted may not be sufficient (e.g. poor resolution of images) to allow for appropriate medical decision making by the Practitioner; and/or  . In rare instances, security protocols could fail, causing a breach of personal health information.  Furthermore, I acknowledge that it is my responsibility to provide information about my medical history, conditions and care that is complete and accurate to the best of my ability. I acknowledge that Practitioner's advice, recommendations, and/or decision may be based on factors not within their control, such as incomplete or inaccurate data provided by me or distortions of diagnostic images or specimens that may result from electronic transmissions. I understand that the practice of medicine is not an exact science and that Practitioner makes no warranties or guarantees regarding treatment outcomes. I acknowledge that I will receive a copy of this consent concurrently upon  execution via email to the email address I last provided but may also request a printed copy by calling the office of [facility].    I understand that my insurance will be billed for this visit.   I have read or had this consent read to me. . I understand the contents of this consent, which adequately explains the benefits and risks of the Services being provided via telemedicine.  . I have been provided ample opportunity to ask questions regarding this consent and the Services and have had my questions answered to my satisfaction. . I give my informed consent for the services to be provided through the use of telemedicine in my medical care  By participating in this telemedicine visit I agree to the above.

## 2019-03-04 NOTE — Progress Notes (Signed)
Virtual Visit via Telephone Note   This visit type was conducted due to national recommendations for restrictions regarding the COVID-19 Pandemic (e.g. social distancing) in an effort to limit this patient's exposure and mitigate transmission in our community.  Due to his co-morbid illnesses, this patient is at least at moderate risk for complications without adequate follow up.  This format is felt to be most appropriate for this patient at this time.  The patient did not have access to video technology/had technical difficulties with video requiring transitioning to audio format only (telephone).  All issues noted in this document were discussed and addressed.  No physical exam could be performed with this format.  Please refer to the patient's chart for his  consent to telehealth for [facility].   Date:  03/08/2019   ID:  Ronald [name], DOB 10/15/1929, MRN [id number]  Patient Location: Home Provider Location: Office  PCP:  Corwin LevinsJohn, James W, MD  Cardiologist:   Eden EmmsNishan Electrophysiologist:  None   Evaluation Performed:  Follow-Up Visit  Chief Complaint:  CAD/CABG  History of Present Illness:    85 y.o. history of CABG 1998 Cath 2003 with occluded SVG RCA Rx medically Activity limited by bilateral foot drop. No angina. Wife is battling lung cancer. Uses lasix for LE edema. [place] [facility] [facility] [employer] and owned fitness centers as a livelihood  Still using his [employer] bicycle trainer and exercising a couple of hours / day No angina Needs refills   The patient  does not have symptoms concerning for COVID-19 infection (fever, chills, cough, or new shortness of breath).    Past Medical History:  Diagnosis Date  . ALLERGIC RHINITIS 11/25/2007  . ANXIETY 11/25/2007  . COLONIC POLYPS, HX OF 11/25/2007  . CORONARY ARTERY DISEASE 11/25/2007  . Cough 01/04/2009  . HEMORRHOIDS, INTERNAL 11/06/2010  . HOARSENESS 01/04/2009  . HYPERLIPIDEMIA 11/25/2007  . HYPERTENSION 11/25/2007  .  Impaired glucose tolerance 04/21/2013  . LOW BACK PAIN 11/25/2007  . Lumbar radiculopathy, chronic    with right foot drop  . PEPTIC ULCER DISEASE 11/25/2007  . 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 Preventative health care 04/07/2011  . PSA, INCREASED 01/05/2009  . Right foot drop   . Spasmodic dysphonia   . TREMOR, ESSENTIAL 11/25/2007   Past Surgical History:  Procedure Laterality Date  . APPENDECTOMY    . BACK SURGERY    . CORONARY ARTERY BYPASS GRAFT    . s/p lumbar laminectomy and fusion    . TONSILLECTOMY       Current Meds  Medication Sig  . aspirin 81 MG tablet Take 81 mg by mouth daily.    [name]. atorvastatin (LIPITOR) 40 MG tablet TAKE 1 TABLET BY MOUTH ONCE DAILY AT  6  PM  . b complex vitamins tablet Take 1 tablet by mouth daily.  . Cholecalciferol (VITAMIN D) 2000 UNITS CAPS Take 2,000 Units by mouth daily.  . clotrimazole-betamethasone (LOTRISONE) cream Use as directed twice per day as needed  . Coenzyme Q10 (COQ10 PO) Take 1 tablet by mouth daily.   . furosemide (LASIX) 20 MG tablet Take 1 tablet (20 mg total) by mouth daily.  . irbesartan (AVAPRO) 150 MG tablet Take 150 mg by mouth daily.  . OMEGA 3 1200 MG CAPS Take 1 capsule by mouth 2 (two) times daily.   . propranolol ER (INDERAL LA) 60 MG 24 hr capsule Take 1 capsule (60 mg total) by mouth daily.  .[name]  telmisartan (MICARDIS) 40 MG tablet Take 1 tablet (40 mg total) by mouth daily.     Allergies:   Patient has no known allergies.   Social History   Tobacco Use  . Smoking status: Never Smoker  . Smokeless tobacco: Never Used  Substance Use Topics  . Alcohol use: Yes    Comment: Occ  . Drug use: No     Family Hx: The patient's family history includes Colon cancer in his father.  ROS:   Please see the history of present illness.     All other systems reviewed and are negative.   Prior CV studies:   The following studies were reviewed today:  Myvue 2008  Labs/Other Tests and  Data Reviewed:    EKG:  SR LAD / RBBB   Recent Labs: 11/12/2018: ALT 20; BUN 23; Creatinine, Ser 1.15; Hemoglobin 12.3; Platelets 222.0; Potassium 4.8; Sodium 139; TSH 3.78   Recent Lipid Panel Lab Results  Component Value Date/Time   CHOL 124 11/12/2018 09:50 AM   TRIG 55.0 11/12/2018 09:50 AM   HDL 57.80 11/12/2018 09:50 AM   CHOLHDL 2 11/12/2018 09:50 AM   LDLCALC 55 11/12/2018 09:50 AM    Wt Readings from Last 3 Encounters:  03/08/19 76.2 kg  01/07/19 77.6 kg  11/12/18 81.6 kg     Objective:    Vital Signs:  BP 135/65   Pulse 66   Ht 5\' 10"  (1.778 m)   Wt 76.2 kg   BMI 24.11 kg/m    Telephone visit no exam   ASSESSMENT & PLAN:    1. CAD/CABG:  CABG 98 cath 2003 occluded RCA graft.  Myovue 2008 non ischemic Active working out at gym everyday no symptoms continue medical Rx  2. HTN: Well controlled.  Continue current medications and low sodium Dash type diet.    3. Chol:   Cholesterol is at goal.  Continue current dose of statin and diet Rx.  No myalgias or side effects.  F/U  LFT's in 6 months. Recent Labs       Lab Results  Component Value Date   LDLCALC 54 11/12/2017     4. Bifasicular block:  Stable no high grade AV block f/u ECG q 6 months                 5. Edema:  Dependant continue lasix    COVID-19 Education: The signs and symptoms of COVID-19 were discussed with the patient and how to seek care for testing (follow up with PCP or arrange E-visit).  The importance of social distancing was discussed today.  Time:   Today, I have spent 30 minutes with the patient with telehealth technology discussing the above problems.     Medication Adjustments/Labs and Tests Ordered: Current medicines are reviewed at length with the patient today.  Concerns regarding medicines are outlined above.   Tests Ordered:  None   Medication Changes:  None   Disposition:  Follow up in a year  Signed, Jenkins Rouge, MD  03/08/2019 7:54 AM    [place]

## 2019-03-08 ENCOUNTER — Encounter: Payer: Self-pay | Admitting: Cardiovascular Disease

## 2019-03-08 ENCOUNTER — Telehealth (INDEPENDENT_AMBULATORY_CARE_PROVIDER_SITE_OTHER): Payer: Medicare Other | Admitting: Cardiovascular Disease

## 2019-03-08 ENCOUNTER — Other Ambulatory Visit: Payer: Self-pay

## 2019-03-08 VITALS — BP 135/65 | HR 66 | Ht 70.0 in | Wt 168.0 lb

## 2019-03-08 DIAGNOSIS — Z951 Presence of aortocoronary bypass graft: Secondary | ICD-10-CM | POA: Diagnosis not present

## 2019-03-08 MED ORDER — FUROSEMIDE 20 MG PO TABS: 20.0000 mg | ORAL_TABLET | Freq: Every day | ORAL | 3 refills | Status: DC

## 2019-03-08 NOTE — Patient Instructions (Addendum)

## 2019-05-10 ENCOUNTER — Telehealth: Payer: Self-pay | Admitting: Internal Medicine

## 2019-05-10 NOTE — Telephone Encounter (Signed)
Medication Refill - Medication: telmisartan (MICARDIS) 40 MG tablet    Has the patient contacted their pharmacy? Yes.   Pt states he only has one pill left. Please advise.  (Agent: If no, request that the patient contact the pharmacy for the refill.) (Agent: If yes, when and what did the pharmacy advise?)  Preferred Pharmacy (with phone number or street name):  [facility] [address] [place], [place] - [address]  [place] [address]  Phone: [phone] Fax: [phone]  Not a 24 hour pharmacy; exact hours not known.     Agent: Please be advised that RX refills may take up to 3 business days. We ask that you follow-up with your pharmacy.

## 2019-05-10 NOTE — Telephone Encounter (Signed)
Phone call to pt. To discuss refill request on Telmisartan.  Noted that on 11/12/18, # 90 with refills x 3 was ordered by Dr. Jenny Reichmann.  Pt. Stated he did not know he had refills on this medication, and he will contact his pharmacy.  Advised to call back if any concerns or questions.  Verb. Understanding.

## 2019-05-14 ENCOUNTER — Other Ambulatory Visit (INDEPENDENT_AMBULATORY_CARE_PROVIDER_SITE_OTHER): Payer: Medicare Other

## 2019-05-14 ENCOUNTER — Encounter: Payer: Self-pay | Admitting: Internal Medicine

## 2019-05-14 ENCOUNTER — Other Ambulatory Visit: Payer: Medicare Other

## 2019-05-14 ENCOUNTER — Other Ambulatory Visit: Payer: Self-pay

## 2019-05-14 ENCOUNTER — Ambulatory Visit (INDEPENDENT_AMBULATORY_CARE_PROVIDER_SITE_OTHER): Payer: Medicare Other | Admitting: Internal Medicine

## 2019-05-14 VITALS — BP 118/74 | HR 71 | Temp 97.9°F | Ht 70.0 in | Wt 177.0 lb

## 2019-05-14 DIAGNOSIS — I10 Essential (primary) hypertension: Secondary | ICD-10-CM

## 2019-05-14 DIAGNOSIS — R7302 Impaired glucose tolerance (oral): Secondary | ICD-10-CM

## 2019-05-14 DIAGNOSIS — E559 Vitamin D deficiency, unspecified: Secondary | ICD-10-CM

## 2019-05-14 DIAGNOSIS — Z23 Encounter for immunization: Secondary | ICD-10-CM

## 2019-05-14 DIAGNOSIS — E611 Iron deficiency: Secondary | ICD-10-CM

## 2019-05-14 DIAGNOSIS — D7589 Other specified diseases of blood and blood-forming organs: Secondary | ICD-10-CM

## 2019-05-14 DIAGNOSIS — E785 Hyperlipidemia, unspecified: Secondary | ICD-10-CM | POA: Diagnosis not present

## 2019-05-14 LAB — CBC WITH DIFFERENTIAL/PLATELET
Basophils Absolute: 0 10*3/uL (ref 0.0–0.1)
Basophils Relative: 0.3 % (ref 0.0–3.0)
Eosinophils Absolute: 0.2 10*3/uL (ref 0.0–0.7)
Eosinophils Relative: 5.6 % — ABNORMAL HIGH (ref 0.0–5.0)
HCT: 35.7 % — ABNORMAL LOW (ref 39.0–52.0)
Hemoglobin: 12.2 g/dL — ABNORMAL LOW (ref 13.0–17.0)
Lymphocytes Relative: 19.3 % (ref 12.0–46.0)
Lymphs Abs: 0.8 10*3/uL (ref 0.7–4.0)
MCHC: 34.2 g/dL (ref 30.0–36.0)
MCV: 109.9 fl — ABNORMAL HIGH (ref 78.0–100.0)
Monocytes Absolute: 0.8 10*3/uL (ref 0.1–1.0)
Monocytes Relative: 17.5 % — ABNORMAL HIGH (ref 3.0–12.0)
Neutro Abs: 2.5 10*3/uL (ref 1.4–7.7)
Neutrophils Relative %: 57.3 % (ref 43.0–77.0)
Platelets: 228 10*3/uL (ref 150.0–400.0)
RBC: 3.24 Mil/uL — ABNORMAL LOW (ref 4.22–5.81)
RDW: 13.7 % (ref 11.5–15.5)
WBC: 4.4 10*3/uL (ref 4.0–10.5)

## 2019-05-14 LAB — HEPATIC FUNCTION PANEL
ALT: 16 U/L (ref 0–53)
AST: 21 U/L (ref 0–37)
Albumin: 3.9 g/dL (ref 3.5–5.2)
Alkaline Phosphatase: 63 U/L (ref 39–117)
Bilirubin, Direct: 0.2 mg/dL (ref 0.0–0.3)
Total Bilirubin: 1.1 mg/dL (ref 0.2–1.2)
Total Protein: 6.4 g/dL (ref 6.0–8.3)

## 2019-05-14 LAB — VITAMIN D 25 HYDROXY (VIT D DEFICIENCY, FRACTURES): VITD: 51.95 ng/mL (ref 30.00–100.00)

## 2019-05-14 LAB — LIPID PANEL
Cholesterol: 130 mg/dL (ref 0–200)
HDL: 58.4 mg/dL (ref >39.00)
LDL Cholesterol: 60 mg/dL (ref 0–99)
NonHDL: 72.06
Total CHOL/HDL Ratio: 2
Triglycerides: 61 mg/dL (ref 0.0–149.0)
VLDL: 12.2 mg/dL (ref 0.0–40.0)

## 2019-05-14 LAB — BASIC METABOLIC PANEL
BUN: 21 mg/dL (ref 6–23)
CO2: 28 mEq/L (ref 19–32)
Calcium: 9.1 mg/dL (ref 8.4–10.5)
Chloride: 103 mEq/L (ref 96–112)
Creatinine, Ser: 1.21 mg/dL (ref 0.40–1.50)
GFR: 56.65 mL/min — ABNORMAL LOW (ref >60.00)
Glucose, Bld: 88 mg/dL (ref 70–99)
Potassium: 4.6 mEq/L (ref 3.5–5.1)
Sodium: 137 mEq/L (ref 135–145)

## 2019-05-14 LAB — FOLATE: Folate: 22.6 ng/mL (ref >5.9)

## 2019-05-14 LAB — IBC PANEL
Iron: 109 ug/dL (ref 42–165)
Saturation Ratios: 40.6 % (ref 20.0–50.0)
Transferrin: 192 mg/dL — ABNORMAL LOW (ref 212.0–360.0)

## 2019-05-14 LAB — VITAMIN B12: Vitamin B-12: 1070 pg/mL — ABNORMAL HIGH (ref 211–911)

## 2019-05-14 LAB — HEMOGLOBIN A1C: Hgb A1c MFr Bld: 6.1 % (ref 4.6–6.5)

## 2019-05-14 NOTE — Patient Instructions (Addendum)
You had the flu shot today, and the Tdap tetanus shot  Please continue all other medications as before, and refills have been done if requested.  Please have the pharmacy call with any other refills you may need.  Please continue your efforts at being more active, low cholesterol diet, and weight control..  Please keep your appointments with your specialists as you may have planned  Please go to the LAB in the Basement (turn left off the elevator) for the tests to be done today  You will be contacted by phone if any changes need to be made immediately.  Otherwise, you will receive a letter about your results with an explanation, but please check with MyChart first.  Please remember to sign up for MyChart if you have not done so, as this will be important to you in the future with finding out test results, communicating by private email, and scheduling acute appointments online when needed.  Please return in 6 months, or sooner if needed

## 2019-05-14 NOTE — Progress Notes (Signed)
Subjective:    Patient ID: Ronald [name], male    DOB: 10-06-1929, 85 y.o.   MRN: [id number]  HPI  Here to f/u; overall doing ok,  Pt denies chest pain, increasing sob or doe, wheezing, orthopnea, PND, increased LE swelling, palpitations, dizziness or syncope.  Pt denies new neurological symptoms such as new headache, or facial or extremity weakness or numbness.  Pt denies polydipsia, polyuria, or low sugar episode.  Pt states overall good compliance with meds, mostly trying to follow appropriate diet, with wt overall stable,  but little exercise however.Does have some benign forgetfulness, remembers this later.   Wt Readings from Last 3 Encounters:  05/14/19 177 lb (80.3 kg)  03/08/19 168 lb (76.2 kg)  01/07/19 171 lb (77.6 kg)   BP Readings from Last 3 Encounters:  05/14/19 118/74  03/08/19 135/65  01/07/19 133/76  Has no complaints, has an exercise regimen for every other day, no pain at all, used to own an exercise club. Past Medical History:  Diagnosis Date  . ALLERGIC RHINITIS 11/25/2007  . ANXIETY 11/25/2007  . COLONIC POLYPS, HX OF 11/25/2007  . CORONARY ARTERY DISEASE 11/25/2007  . Cough 01/04/2009  . HEMORRHOIDS, INTERNAL 11/06/2010  . HOARSENESS 01/04/2009  . HYPERLIPIDEMIA 11/25/2007  . HYPERTENSION 11/25/2007  . Impaired glucose tolerance 04/21/2013  . LOW BACK PAIN 11/25/2007  . Lumbar radiculopathy, chronic    with right foot drop  . PEPTIC ULCER DISEASE 11/25/2007  . 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 Preventative health care 04/07/2011  . PSA, INCREASED 01/05/2009  . Right foot drop   . Spasmodic dysphonia   . TREMOR, ESSENTIAL 11/25/2007   Past Surgical History:  Procedure Laterality Date  . APPENDECTOMY    . BACK SURGERY    . CORONARY ARTERY BYPASS GRAFT    . s/p lumbar laminectomy and fusion    . TONSILLECTOMY      reports that he has never smoked. He has never used smokeless tobacco. He reports current alcohol use. He reports that he  does not use drugs. family history includes Colon cancer in his father. No Known Allergies Current Outpatient Medications on File Prior to Visit  Medication Sig Dispense Refill  . aspirin 81 MG tablet Take 81 mg by mouth daily.      [name] atorvastatin (LIPITOR) 40 MG tablet TAKE 1 TABLET BY MOUTH ONCE DAILY AT  6  PM 90 tablet 1  . b complex vitamins tablet Take 1 tablet by mouth daily.    . Cholecalciferol (VITAMIN D) 2000 UNITS CAPS Take 2,000 Units by mouth daily.    . clotrimazole-betamethasone (LOTRISONE) cream Use as directed twice per day as needed 45 g 1  . Coenzyme Q10 (COQ10 PO) Take 1 tablet by mouth daily.     . furosemide (LASIX) 20 MG tablet Take 1 tablet (20 mg total) by mouth daily. 90 tablet 3  . irbesartan (AVAPRO) 150 MG tablet Take 150 mg by mouth daily.    . OMEGA 3 1200 MG CAPS Take 1 capsule by mouth 2 (two) times daily.     . propranolol ER (INDERAL LA) 60 MG 24 hr capsule Take 1 capsule (60 mg total) by mouth daily. 90 capsule 3  . telmisartan (MICARDIS) 40 MG tablet Take 1 tablet (40 mg total) by mouth daily. 90 tablet 3   No current facility-administered medications on file prior to visit.    Review of Systems  Constitutional: Negative for other unusual diaphoresis  or sweats HENT: Negative for ear discharge or swelling Eyes: Negative for other worsening visual disturbances Respiratory: Negative for stridor or other swelling  Gastrointestinal: Negative for worsening distension or other blood Genitourinary: Negative for retention or other urinary change Musculoskeletal: Negative for other MSK pain or swelling Skin: Negative for color change or other new lesions Neurological: Negative for worsening tremors and other numbness  Psychiatric/Behavioral: Negative for worsening agitation or other fatigue All other system neg per pt    Objective:   Physical Exam BP 118/74   Pulse 71   Temp 97.9 F (36.6 C) (Oral)   Ht 5\' 10"  (1.778 m)   Wt 177 lb (80.3 kg)   SpO2  96%   BMI 25.40 kg/m  VS noted,  Constitutional: Pt appears in NAD HENT: Head: NCAT.  Right Ear: External ear normal.  Left Ear: External ear normal.  Eyes: . Pupils are equal, round, and reactive to light. Conjunctivae and EOM are normal Nose: without d/c or deformity Neck: Neck supple. Gross normal ROM Cardiovascular: Normal rate and regular rhythm.   Pulmonary/Chest: Effort normal and breath sounds without rales or wheezing.  Abd:  Soft, NT, ND, + BS, no organomegaly Neurological: Pt is alert. At baseline orientation, motor grossly intact Skin: Skin is warm. No rashes, other new lesions, no LE edema Psychiatric: Pt behavior is normal without agitation  No other exam findings  Lab Results  Component Value Date   WBC 4.4 11/12/2018   HGB 12.3 (L) 11/12/2018   HCT 35.4 (L) 11/12/2018   PLT 222.0 11/12/2018   GLUCOSE 102 (H) 11/12/2018   CHOL 124 11/12/2018   TRIG 55.0 11/12/2018   HDL 57.80 11/12/2018   LDLCALC 55 11/12/2018   ALT 20 11/12/2018   AST 25 11/12/2018   NA 139 11/12/2018   K 4.8 11/12/2018   CL 103 11/12/2018   CREATININE 1.15 11/12/2018   BUN 23 11/12/2018   CO2 31 11/12/2018   TSH 3.78 11/12/2018   PSA 2.00 01/25/2010   HGBA1C 6.1 11/12/2017      Assessment & Plan:

## 2019-05-16 ENCOUNTER — Encounter: Payer: Self-pay | Admitting: Internal Medicine

## 2019-05-16 NOTE — Assessment & Plan Note (Signed)
stable overall by history and exam, recent data reviewed with pt, and pt to continue medical treatment as before,  to f/u any worsening symptoms or concerns  

## 2019-05-16 NOTE — Assessment & Plan Note (Signed)
Etiology unclear, for b12 and folate

## 2019-05-18 ENCOUNTER — Ambulatory Visit: Payer: Medicare Other | Admitting: Neurology

## 2019-08-04 ENCOUNTER — Other Ambulatory Visit: Payer: Self-pay | Admitting: Internal Medicine

## 2019-08-09 ENCOUNTER — Ambulatory Visit: Payer: Self-pay

## 2019-08-09 NOTE — Telephone Encounter (Signed)
OV scheduled with Dr. Tamala Julian

## 2019-08-09 NOTE — Telephone Encounter (Signed)
Needs OV, or see Dr Tamala Julian , or UC or ED

## 2019-08-09 NOTE — Telephone Encounter (Signed)
Patient called and says he's been having left arm pain for about a week. He says he has not injured the arm or has done any strenuous exercise that would injure the arm. He says it's just getting worse and worse. He says it's painful in the shoulder, but it hurts down his arm at a 9 when he attempts to move it up. He says he can move it normal, no issues, just painful. He denies numbness, tingling to the arm and fingers, no other symptoms. I called the office and spoke to [place], [facility] who asked to speak to the patient, the call was connected successfully.  Reason for Disposition . [1] MODERATE pain (e.g., interferes with normal activities) AND [2] present > 3 days  Answer Assessment - Initial Assessment Questions 1. ONSET: "When did the pain start?"     1 week ago 2. LOCATION: "Where is the pain located?"     Left arm right at the top at the shoulder 3. PAIN: "How bad is the pain?" (Scale 1-10; or mild, moderate, severe)   - MILD (1-3): doesn't interfere with normal activities   - MODERATE (4-7): interferes with normal activities (e.g., work or school) or awakens from sleep   - SEVERE (8-10): excruciating pain, unable to do any normal activities, unable to hold a cup of water     9 4. WORK OR EXERCISE: "Has there been any recent work or exercise that involved this part of the body?"     Not that I can think of 5. CAUSE: "What do you think is causing the arm pain?"     I don't know 6. OTHER SYMPTOMS: "Do you have any other symptoms?" (e.g., neck pain, swelling, rash, fever, numbness, weakness)     No 7. PREGNANCY: "Is there any chance you are pregnant?" "When was your last menstrual period?"     N/A  Protocols used: ARM PAIN-A-AH

## 2019-08-10 ENCOUNTER — Other Ambulatory Visit: Payer: Self-pay

## 2019-08-10 ENCOUNTER — Ambulatory Visit (INDEPENDENT_AMBULATORY_CARE_PROVIDER_SITE_OTHER)
Admission: RE | Admit: 2019-08-10 | Discharge: 2019-08-10 | Disposition: A | Discharge status: Home / Self Care | Payer: Medicare Other | Source: Ambulatory Visit | Attending: Internal Medicine | Admitting: Internal Medicine

## 2019-08-10 ENCOUNTER — Ambulatory Visit (INDEPENDENT_AMBULATORY_CARE_PROVIDER_SITE_OTHER): Payer: Medicare Other | Admitting: Internal Medicine

## 2019-08-10 ENCOUNTER — Encounter: Payer: Self-pay | Admitting: Internal Medicine

## 2019-08-10 DIAGNOSIS — M79622 Pain in left upper arm: Secondary | ICD-10-CM | POA: Diagnosis not present

## 2019-08-10 DIAGNOSIS — R7302 Impaired glucose tolerance (oral): Secondary | ICD-10-CM | POA: Diagnosis not present

## 2019-08-10 DIAGNOSIS — I10 Essential (primary) hypertension: Secondary | ICD-10-CM

## 2019-08-10 MED ORDER — HYDROCODONE-ACETAMINOPHEN 5-325 MG PO TABS: 1.0000 | ORAL_TABLET | Freq: Four times a day (QID) | ORAL | 0 refills | Status: DC | PRN

## 2019-08-10 NOTE — Patient Instructions (Signed)
I suspect you had a deltoid muscle tear or severe strain  Please take all new medication as prescribed - the pain medication as needed (and call in 1 wk for more medication if needed)  Please continue all other medications as before, and refills have been done if requested.  Please have the pharmacy call with any other refills you may need.  Please continue your efforts at being more active, low cholesterol diet, and weight control.  Please keep your appointments with your specialists as you may have planned  Please see Dr Tamala Julian for the left arm pain  Please go to the XRAY Department in the Basement (go straight as you get off the elevator) for the x-ray testing  You will be contacted by phone if any changes need to be made immediately.  Otherwise, you will receive a letter about your results with an explanation, but please check with MyChart first.  Please remember to sign up for MyChart if you have not done so, as this will be important to you in the future with finding out test results, communicating by private email, and scheduling acute appointments online when needed.

## 2019-08-10 NOTE — Progress Notes (Signed)
Subjective:    Patient ID: Ronald [name], male    DOB: [birth year], 85 y.o.   MRN: [id number]  HPI  Here with severe almost unbearable sharp pain to the LUE near the deltoid insertion site, x 2 wks, gradually worsening, worse to flex the elbow and trying to pick up the arm, better to sit still, does excercises at home but doesn't recall any specific injury, rides the stationary bike but also works the arms with a 15 lb wt lifting overhead, just did not have pain when doing it. Did stop doing it.  No swelling or bruising in the mirror, and no trauma.  Pt denies chest pain, increasing sob or doe, wheezing, orthopnea, PND, increased LE swelling, palpitations, dizziness or syncope.  Pt denies new neurological symptoms such as new headache, or facial or extremity weakness or numbness.  Pt denies polydipsia, polyuria. Past Medical History:  Diagnosis Date  . ALLERGIC RHINITIS 11/25/2007  . ANXIETY 11/25/2007  . COLONIC POLYPS, HX OF 11/25/2007  . CORONARY ARTERY DISEASE 11/25/2007  . Cough 01/04/2009  . HEMORRHOIDS, INTERNAL 11/06/2010  . HOARSENESS 01/04/2009  . HYPERLIPIDEMIA 11/25/2007  . HYPERTENSION 11/25/2007  . Impaired glucose tolerance 04/21/2013  . LOW BACK PAIN 11/25/2007  . Lumbar radiculopathy, chronic    with right foot drop  . PEPTIC ULCER DISEASE 11/25/2007  . 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 Preventative health care 04/07/2011  . PSA, INCREASED 01/05/2009  . Right foot drop   . Spasmodic dysphonia   . TREMOR, ESSENTIAL 11/25/2007   Past Surgical History:  Procedure Laterality Date  . APPENDECTOMY    . BACK SURGERY    . CORONARY ARTERY BYPASS GRAFT    . s/p lumbar laminectomy and fusion    . TONSILLECTOMY      reports that he has never smoked. He has never used smokeless tobacco. He reports current alcohol use. He reports that he does not use drugs. family history includes Colon cancer in his father. No Known Allergies Current Outpatient Medications on  File Prior to Visit  Medication Sig Dispense Refill  . aspirin 81 MG tablet Take 81 mg by mouth daily.      [name] atorvastatin (LIPITOR) 40 MG tablet TAKE 1 TABLET BY MOUTH ONCE DAILY 6 IN THE EVENING 90 tablet 1  . b complex vitamins tablet Take 1 tablet by mouth daily.    . Cholecalciferol (VITAMIN D) 2000 UNITS CAPS Take 2,000 Units by mouth daily.    . clotrimazole-betamethasone (LOTRISONE) cream Use as directed twice per day as needed 45 g 1  . Coenzyme Q10 (COQ10 PO) Take 1 tablet by mouth daily.     . furosemide (LASIX) 20 MG tablet Take 1 tablet (20 mg total) by mouth daily. 90 tablet 3  . irbesartan (AVAPRO) 150 MG tablet Take 150 mg by mouth daily.    . OMEGA 3 1200 MG CAPS Take 1 capsule by mouth 2 (two) times daily.     . propranolol ER (INDERAL LA) 60 MG 24 hr capsule Take 1 capsule (60 mg total) by mouth daily. 90 capsule 3  . telmisartan (MICARDIS) 40 MG tablet Take 1 tablet (40 mg total) by mouth daily. 90 tablet 3   No current facility-administered medications on file prior to visit.    Review of Systems  Constitutional: Negative for other unusual diaphoresis or sweats HENT: Negative for ear discharge or swelling Eyes: Negative for other worsening visual disturbances Respiratory: Negative for stridor or  other swelling  Gastrointestinal: Negative for worsening distension or other blood Genitourinary: Negative for retention or other urinary change Musculoskeletal: Negative for other MSK pain or swelling Skin: Negative for color change or other new lesions Neurological: Negative for worsening tremors and other numbness  Psychiatric/Behavioral: Negative for worsening agitation or other fatigue All otherwise neg per pt     Objective:   Physical Exam BP 116/78   Pulse 81   Temp 98.1 F (36.7 C) (Oral)   Ht 5\' 10"  (1.778 m)   Wt 179 lb (81.2 kg)   SpO2 96%   BMI 25.68 kg/m  VS noted,  Constitutional: Pt appears in NAD HENT: Head: NCAT.  Right Ear: External ear  normal.  Left Ear: External ear normal.  Eyes: . Pupils are equal, round, and reactive to light. Conjunctivae and EOM are normal Nose: without d/c or deformity Neck: Neck supple. Gross normal ROM Cardiovascular: Normal rate and regular rhythm.   Pulmonary/Chest: Effort normal and breath sounds without rales or wheezing.  Left anterior deltoid tender with a possible tearing Neurological: Pt is alert. At baseline orientation, motor grossly intact Skin: Skin is warm. No rashes, other new lesions, no LE edema Psychiatric: Pt behavior is normal without agitation  All otherwise neg per pt Lab Results  Component Value Date   WBC 4.4 05/14/2019   HGB 12.2 (L) 05/14/2019   HCT 35.7 (L) 05/14/2019   PLT 228.0 05/14/2019   GLUCOSE 88 05/14/2019   CHOL 130 05/14/2019   TRIG 61.0 05/14/2019   HDL 58.40 05/14/2019   LDLCALC 60 05/14/2019   ALT 16 05/14/2019   AST 21 05/14/2019   NA 137 05/14/2019   K 4.6 05/14/2019   CL 103 05/14/2019   CREATININE 1.21 05/14/2019   BUN 21 05/14/2019   CO2 28 05/14/2019   TSH 3.78 11/12/2018   PSA 2.00 01/25/2010   HGBA1C 6.1 05/14/2019        Assessment & Plan:

## 2019-08-11 ENCOUNTER — Ambulatory Visit: Payer: Medicare Other | Admitting: Internal Medicine

## 2019-08-14 ENCOUNTER — Encounter: Payer: Self-pay | Admitting: Internal Medicine

## 2019-08-14 NOTE — Assessment & Plan Note (Signed)
stable overall by history and exam, recent data reviewed with pt, and pt to continue medical treatment as before,  to f/u any worsening symptoms or concerns  

## 2019-08-14 NOTE — Assessment & Plan Note (Signed)
C/w possible deltoid tearing, for left shoulder film, vicodin prn, and f/u sport med

## 2019-08-25 NOTE — Progress Notes (Signed)
Date:  09/06/2019   ID:  Ronald [name], DOB 1930/02/06, MRN [id number]  Provider Location: Office  PCP:  [name], MD  Cardiologist:   Eden Emms Electrophysiologist:  None   Evaluation Performed:  Follow-Up Visit  Chief Complaint:  CAD/CABG  History of Present Illness:    85 y.o. history of CABG 1998 Cath 2003 with occluded SVG RCA Rx medically Activity limited by bilateral foot drop. No angina. Wife is battling lung cancer. Uses lasix for LE edema. 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    Past Medical History:  Diagnosis Date  . ALLERGIC RHINITIS 11/25/2007  . ANXIETY 11/25/2007  . COLONIC POLYPS, HX OF 11/25/2007  . CORONARY ARTERY DISEASE 11/25/2007  . Cough 01/04/2009  . HEMORRHOIDS, INTERNAL 11/06/2010  . HOARSENESS 01/04/2009  . HYPERLIPIDEMIA 11/25/2007  . HYPERTENSION 11/25/2007  . Impaired glucose tolerance 04/21/2013  . LOW BACK PAIN 11/25/2007  . Lumbar radiculopathy, chronic    with right foot drop  . PEPTIC ULCER DISEASE 11/25/2007  . 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 Preventative health care 04/07/2011  . PSA, INCREASED 01/05/2009  . Right foot drop   . Spasmodic dysphonia   . TREMOR, ESSENTIAL 11/25/2007   Past Surgical History:  Procedure Laterality Date  . APPENDECTOMY    . BACK SURGERY    . CORONARY ARTERY BYPASS GRAFT    . s/p lumbar laminectomy and fusion    . TONSILLECTOMY       Current Meds  Medication Sig  . aspirin 81 MG tablet Take 81 mg by mouth daily.    [name] atorvastatin (LIPITOR) 40 MG tablet TAKE 1 TABLET BY MOUTH  ONCE DAILY 6 IN THE EVENING  . b complex vitamins tablet Take 1 tablet by mouth daily.  . Cholecalciferol (VITAMIN D) 2000 UNITS CAPS Take 2,000 Units by mouth daily.  . clotrimazole-betamethasone (LOTRISONE) cream Use as directed twice per day as needed  . Coenzyme Q10 (COQ10 PO) Take 1 tablet by mouth daily.   . furosemide (LASIX) 20 MG tablet Take 1 tablet (20 mg total) by mouth daily.  . irbesartan (AVAPRO) 150 MG tablet Take 150 mg by mouth daily.  . OMEGA 3 1200 MG CAPS Take 1 capsule by mouth 2 (two) times daily.   . propranolol ER (INDERAL LA) 60 MG 24 hr capsule Take 1 capsule (60 mg total) by mouth daily.  [name] telmisartan (MICARDIS) 40 MG tablet Take 1 tablet (40 mg total) by mouth daily.     Allergies:   Patient has no known allergies.   Social History   Tobacco Use  . Smoking status: Never Smoker  . Smokeless tobacco: Never Used  Substance Use Topics  . Alcohol use: Yes    Comment: Occ  . Drug use: No     Family Hx: The patient's family history includes Colon cancer in his father.  ROS:   Please see the history of present illness.     All other systems reviewed and are negative.   Prior CV studies:   The following  studies were reviewed today:  Myvue 2008  Labs/Other Tests and Data Reviewed:    EKG:  SR LAD / RBBB rate 59 no acute changes 09/06/19   Recent Labs: 11/12/2018: TSH 3.78 05/14/2019: ALT 16; BUN 21; Creatinine, Ser 1.21; Hemoglobin 12.2; Platelets 228.0; Potassium 4.6; Sodium 137   Recent Lipid Panel Lab Results  Component Value Date/Time   CHOL 130 05/14/2019 08:44 AM   TRIG 61.0 05/14/2019 08:44 AM   HDL 58.40 05/14/2019 08:44 AM   CHOLHDL 2 05/14/2019 08:44 AM   LDLCALC 60 05/14/2019 08:44 AM    Wt Readings from Last 3 Encounters:  09/06/19 178 lb (80.7 kg)  08/31/19 179 lb (81.2 kg)  08/10/19 179 lb (81.2 kg)     Objective:    Vital Signs:  BP 122/70   Pulse (!) 59   Ht 5\' 10"  (1.778 m)   Wt 178 lb (80.7 kg)   SpO2 99%   BMI  25.54 kg/m    Affect appropriate Elderly white male  HEENT: normal Neck supple with no adenopathy JVP normal no bruits no thyromegaly Lungs clear with no wheezing and good diaphragmatic motion Heart:  S1/S2 no murmur, no rub, gallop or click PMI normal post sternotomy  Abdomen: benighn, BS positve, no tenderness, no AAA no bruit.  No HSM or HJR Distal pulses intact with no bruits No edema Neuro chronic bilateral foot drop  Skin warm and dry  ASSESSMENT & PLAN:    1. CAD/CABG:  CABG 98 cath 2003 occluded RCA graft.  Myovue 2008 non ischemic Active working out at gym everyday no symptoms continue medical Rx  2. HTN: Well controlled.  Continue current medications and low sodium Dash type diet.    3. Chol:   Cholesterol is at goal.  Continue current dose of statin and diet Rx.  No myalgias or side effects.  F/U  LFT's in 6 months. Recent Labs       Lab Results  Component Value Date   LDLCALC 54 11/12/2017     4. Bifasicular block:  Stable no high grade AV block f/u ECG q 6 months                 5. Edema:  Dependant continue lasix   6. Ortho:  F/u with Dr Tamala Julian for injured left shoulder still with some restriction to motion    COVID-19 Education: The signs and symptoms of COVID-19 were discussed with the patient and how to seek care for testing (follow up with PCP or arrange E-visit).  The importance of social distancing was discussed today.  Time:   Today, I have spent 30 minutes with the patient with telehealth technology discussing the above problems, preparing note and reviewing chart    Medication Adjustments/Labs and Tests Ordered: Current medicines are reviewed at length with the patient today.  Concerns regarding medicines are outlined above.   Tests Ordered:  None   Medication Changes:  None   Disposition:  Follow up in a year  Signed, Jenkins Rouge, MD  09/06/2019 8:23 AM    [place]

## 2019-08-27 ENCOUNTER — Encounter: Payer: Self-pay | Admitting: *Deleted

## 2019-08-30 NOTE — Progress Notes (Signed)
[facility] [address] [place], [place] [address] Phone: [phone] Subjective:   Bruce Donath, am serving as a scribe for Dr. Antoine Primas.  This visit occurred during the SARS-CoV-2 public health emergency.  Safety protocols were in place, including screening questions prior to the visit, additional usage of staff PPE, and extensive cleaning of exam room while observing appropriate contact time as indicated for disinfecting solutions.   I'm seeing this patient by the request  of:  [name], MD   CC: Arm pain left  OIN:[id number]  Ronald [name] is a 85 y.o. male coming in with complaint of left arm pain . Last seen on 03/12/2018 by Dr. Jordan Likes for trigger finger. Patient states that he is unsure how the pain started in left shoulder. Pain ongoing for 3 weeks. Does do strength training workouts with weights. Pain over anterior aspect. Has been using Voltaren gel. Is unable to perform flexion in AROM but can in passive range. Has taken Aleve prn.        X-rays of the left humerus were unremarkable that were taken on 08/10/2019  Past Medical History:  Diagnosis Date  . ALLERGIC RHINITIS 11/25/2007  . ANXIETY 11/25/2007  . COLONIC POLYPS, HX OF 11/25/2007  . CORONARY ARTERY DISEASE 11/25/2007  . Cough 01/04/2009  . HEMORRHOIDS, INTERNAL 11/06/2010  . HOARSENESS 01/04/2009  . HYPERLIPIDEMIA 11/25/2007  . HYPERTENSION 11/25/2007  . Impaired glucose tolerance 04/21/2013  . LOW BACK PAIN 11/25/2007  . Lumbar radiculopathy, chronic    with right foot drop  . PEPTIC ULCER DISEASE 11/25/2007  . 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 Preventative health care 04/07/2011  . PSA, INCREASED 01/05/2009  . Right foot drop   . Spasmodic dysphonia   . TREMOR, ESSENTIAL 11/25/2007   Past Surgical History:  Procedure Laterality Date  . APPENDECTOMY    . BACK SURGERY    . CORONARY ARTERY BYPASS GRAFT    . s/p lumbar laminectomy and fusion     . TONSILLECTOMY     Social History   Socioeconomic History  . Marital status: Married    Spouse name: Not on file  . Number of children: 3  . Years of education: Not on file  . Highest education level: Not on file  Occupational History  . Occupation: Retired    Associate Professor: RETIRED  Tobacco Use  . Smoking status: Never Smoker  . Smokeless tobacco: Never Used  Substance and Sexual Activity  . Alcohol use: Yes    Comment: Occ  . Drug use: No  . Sexual activity: Not Currently  Other Topics Concern  . Not on file  Social History Narrative   Son is Administrator, arts in [place]   Social Determinants of Health   Financial Resource Strain: Low Risk   . Difficulty of Paying Living Expenses: Not hard at all  Food Insecurity: No Food Insecurity  . Worried About Programme researcher, broadcasting/film/video in the Last Year: Never true  . Ran Out of Food in the Last Year: Never true  Transportation Needs: No Transportation Needs  . Lack of Transportation (Medical): No  . Lack of Transportation (Non-Medical): No  Physical Activity: Sufficiently Active  . Days of Exercise per Week: 5 days  . Minutes of Exercise per Session: 40 min  Stress: No Stress Concern Present  . Feeling of Stress : Only a little  Social Connections: Not Isolated  . Frequency of Communication with Friends and  Family: More than three times a week  . Frequency of Social Gatherings with Friends and Family: More than three times a week  . Attends Religious Services: More than 4 times per year  . Active Member of Clubs or Organizations: Yes  . Attends BankerClub or Organization Meetings: More than 4 times per year  . Marital Status: Married   No Known Allergies Family History  Problem Relation Age of Onset  . Colon cancer Father        colon     Current Outpatient Medications (Cardiovascular):  .  atorvastatin (LIPITOR) 40 MG tablet, TAKE 1 TABLET BY MOUTH ONCE DAILY 6 IN THE EVENING .  furosemide (LASIX) 20 MG tablet, Take 1 tablet (20 mg  total) by mouth daily. .  irbesartan (AVAPRO) 150 MG tablet, Take 150 mg by mouth daily. .  propranolol ER (INDERAL LA) 60 MG 24 hr capsule, Take 1 capsule (60 mg total) by mouth daily. [name].  telmisartan (MICARDIS) 40 MG tablet, Take 1 tablet (40 mg total) by mouth daily.   Current Outpatient Medications (Analgesics):  .  aspirin 81 MG tablet, Take 81 mg by mouth daily.     Current Outpatient Medications (Other):  .  b complex vitamins tablet, Take 1 tablet by mouth daily. .  Cholecalciferol (VITAMIN D) 2000 UNITS CAPS, Take 2,000 Units by mouth daily. .  clotrimazole-betamethasone (LOTRISONE) cream, Use as directed twice per day as needed .  Coenzyme Q10 (COQ10 PO), Take 1 tablet by mouth daily.  .  OMEGA 3 1200 MG CAPS, Take 1 capsule by mouth 2 (two) times daily.     Past medical history, social, surgical and family history all reviewed in electronic medical record.  No pertanent information unless stated regarding to the chief complaint.   Review of Systems:  No headache, visual changes, nausea, vomiting, diarrhea, constipation, dizziness, abdominal pain, skin rash, fevers, chills, night sweats, weight loss, swollen lymph nodes, body aches, joint swelling, , chest pain, shortness of breath, mood changes.  Positive muscle aches  Objective  Blood pressure 128/70, height 5\' 10"  (1.778 m), weight 179 lb (81.2 kg).    General: No apparent distress alert and oriented x3 mood and affect normal, dressed appropriately.  HEENT: Pupils equal, extraocular movements intact  Respiratory: Patient's speak in full sentences and does not appear short of breath  Cardiovascular: No lower extremity edema, non tender, no erythema  Skin: Warm dry intact with no signs of infection or rash on extremities or on axial skeleton.  Abdomen: Soft nontender  Neuro: Cranial nerves II through XII are intact, neurovascularly intact in all extremities with 2+ DTRs and 2+ pulses.  Lymph: No lymphadenopathy of  posterior or anterior cervical chain or axillae bilaterally.  Gait normal with good balance and coordination.  MSK:  tender with range of motion and good stability and symmetric strength and tone of  elbows, wrist, hip, knee and ankles bilaterally.  Moderate arthritic changes of multiple joints Neck: Inspection loss of lordosis. No palpable stepoffs. Negative Spurling's maneuver. Patient does have some mild limited range of motion in all planes of 5 to 10 degrees with some mild crepitus. Grip strength and sensation normal in bilateral hands Strength good C4 to T1 distribution No sensory change to C4 to T1 Negative Hoffman sign bilaterally Reflexes normal  Shoulder: Left Inspection reveals no abnormalities, atrophy or asymmetry. Palpation is normal with no tenderness over AC joint or bicipital groove.  Patient does have a mild tremor of the left  upper extremity ROM is active at this moment.  Patient does have 4-5 strength of the rotator cuff Rotator cuff strength 4 out of 5 Positive muscle aches positive speeds test mild positive O'Brien's Speeds and Yergason's tests normal. Normal scapular function observed. No painful arc and no drop arm sign. No apprehension sign Contralateral shoulder unremarkable  Limited musculoskeletal ultrasound was performed and interpreted by Lyndal Pulley  Limited ultrasound of patient's shoulder shows patient does have a slight severe subacromial bursitis noted. Addition of this patient also has on arthritic changes of the acromioclavicular joint.  Small partial tearing of the subscapularis tendon noted but no retraction.  Bicep tendon has hypoechoic changes with possible mild intrasubstance tearing noted.  Impression: Degenerative shoulder of the acromioclavicular joint and an acute subacromial bursitis.  Procedure: Real-time Ultrasound Guided Injection of left glenohumeral joint Device: GE Logiq E  Ultrasound guided injection is preferred based  studies that show increased duration, increased effect, greater accuracy, decreased procedural pain, increased response rate with ultrasound guided versus blind injection.  Verbal informed consent obtained.  Time-out conducted.  Noted no overlying erythema, induration, or other signs of local infection.  Skin prepped in a sterile fashion.  Local anesthesia: Topical Ethyl chloride.  With sterile technique and under real time ultrasound guidance:  Joint visualized.  21g 2 inch needle inserted posterior approach. Pictures taken for needle placement. Patient did have injection of 2 cc of 0.5% Marcaine, and 1cc of Kenalog 40 mg/dL. Completed without difficulty  Pain immediately resolved suggesting accurate placement of the medication.  Advised to call if fevers/chills, erythema, induration, drainage, or persistent bleeding.  Images permanently stored and available for review in the ultrasound unit.  Impression: Technically successful ultrasound guided injection.  Procedure: Real-time Ultrasound Guided Injection of left acromioclavicular joint Device: GE Logiq Q7 Ultrasound guided injection is preferred based studies that show increased duration, increased effect, greater accuracy, decreased procedural pain, increased response rate, and decreased cost with ultrasound guided versus blind injection.  Verbal informed consent obtained.  Time-out conducted.  Noted no overlying erythema, induration, or other signs of local infection.  Skin prepped in a sterile fashion.  Local anesthesia: Topical Ethyl chloride.  With sterile technique and under real time ultrasound guidance:   Completed without difficulty  Pain immediately resolved suggesting accurate placement of the medication.  Advised to call if fevers/chills, erythema, induration, drainage, or persistent bleeding.  Images permanently stored and available for review in the ultrasound unit.  Impression: Technically successful ultrasound guided  injection.   [id number]; 15 additional minutes spent for Therapeutic exercises as stated in above notes.  This included exercises focusing on stretching, strengthening, with significant focus on eccentric aspects.   Long term goals include an improvement in range of motion, strength, endurance as well as avoiding reinjury. Patient's frequency would include in 1-2 times a day, 3-5 times a week for a duration of 6-12 weeks. Shoulder Exercises that included:  Basic scapular stabilization to include adduction and depression of scapula Scaption, focusing on proper movement and good control Internal and External rotation utilizing a theraband, with elbow tucked at side entire time Rows with theraband given    Proper technique shown and discussed handout in great detail with ATC.  All questions were discussed and answered.     Impression and Recommendations:     This case required medical decision making of moderate complexity. The above documentation has been reviewed and is accurate and complete Lyndal Pulley, DO  Note: This dictation was prepared with Dragon dictation along with smaller phrase technology. Any transcriptional errors that result from this process are unintentional.

## 2019-08-31 ENCOUNTER — Ambulatory Visit (INDEPENDENT_AMBULATORY_CARE_PROVIDER_SITE_OTHER): Payer: Medicare Other | Admitting: Family Medicine

## 2019-08-31 ENCOUNTER — Other Ambulatory Visit: Payer: Self-pay

## 2019-08-31 ENCOUNTER — Ambulatory Visit: Payer: Self-pay

## 2019-08-31 ENCOUNTER — Encounter: Payer: Self-pay | Admitting: Family Medicine

## 2019-08-31 VITALS — BP 128/70 | Ht 70.0 in | Wt 179.0 lb

## 2019-08-31 DIAGNOSIS — M25512 Pain in left shoulder: Secondary | ICD-10-CM

## 2019-08-31 DIAGNOSIS — G8929 Other chronic pain: Secondary | ICD-10-CM | POA: Diagnosis not present

## 2019-08-31 DIAGNOSIS — M19012 Primary osteoarthritis, left shoulder: Secondary | ICD-10-CM | POA: Diagnosis not present

## 2019-08-31 DIAGNOSIS — M19019 Primary osteoarthritis, unspecified shoulder: Secondary | ICD-10-CM | POA: Insufficient documentation

## 2019-08-31 NOTE — Patient Instructions (Addendum)
  [address], [address]st [address] [place], [place] [address] Phone [phone]  Injected shoulder and [facility] joint Exercises 3x a week Pennsaid 2x a day as needed See me again in 5 weeks can do virtual if better

## 2019-08-31 NOTE — Assessment & Plan Note (Signed)
Patient had what appeared to be a very small partial tear of the bicep tendon as well as a subacromial bursitis and some small degenerative tearing of the suprascapular tendon.  No retraction noted.  Significant improvement in range of motion and strength immediately after steroid injection today.  This likely means patient will do well with conservative therapy and home exercises given.  Will avoid outpatient physical therapy secondary to the coronavirus.  Discussed which activities doing which wants to avoid.  Follow-up again in 4 to 8 weeks.

## 2019-09-06 ENCOUNTER — Encounter: Payer: Self-pay | Admitting: Cardiovascular Disease

## 2019-09-06 ENCOUNTER — Ambulatory Visit: Payer: Medicare Other | Admitting: Cardiovascular Disease

## 2019-09-06 ENCOUNTER — Other Ambulatory Visit: Payer: Self-pay

## 2019-09-06 VITALS — BP 122/70 | HR 59 | Ht 70.0 in | Wt 178.0 lb

## 2019-09-06 DIAGNOSIS — I25810 Atherosclerosis of coronary artery bypass graft(s) without angina pectoris: Secondary | ICD-10-CM | POA: Diagnosis not present

## 2019-09-06 DIAGNOSIS — Z951 Presence of aortocoronary bypass graft: Secondary | ICD-10-CM | POA: Diagnosis not present

## 2019-09-06 NOTE — Patient Instructions (Signed)

## 2019-10-05 ENCOUNTER — Ambulatory Visit: Payer: Medicare Other | Admitting: Family Medicine

## 2019-11-04 ENCOUNTER — Other Ambulatory Visit: Payer: Self-pay | Admitting: Internal Medicine

## 2019-11-16 ENCOUNTER — Encounter: Payer: Self-pay | Admitting: Internal Medicine

## 2019-11-16 ENCOUNTER — Other Ambulatory Visit: Payer: Self-pay

## 2019-11-16 ENCOUNTER — Ambulatory Visit (INDEPENDENT_AMBULATORY_CARE_PROVIDER_SITE_OTHER): Payer: Medicare Other | Admitting: Internal Medicine

## 2019-11-16 VITALS — BP 134/82 | HR 63 | Temp 98.1°F | Ht 70.0 in | Wt 180.0 lb

## 2019-11-16 DIAGNOSIS — Z0000 Encounter for general adult medical examination without abnormal findings: Secondary | ICD-10-CM | POA: Diagnosis not present

## 2019-11-16 LAB — LIPID PANEL
Cholesterol: 132 mg/dL (ref 0–200)
HDL: 58.8 mg/dL (ref >39.00)
LDL Cholesterol: 61 mg/dL (ref 0–99)
NonHDL: 73.42
Total CHOL/HDL Ratio: 2
Triglycerides: 64 mg/dL (ref 0.0–149.0)
VLDL: 12.8 mg/dL (ref 0.0–40.0)

## 2019-11-16 LAB — URINALYSIS, ROUTINE W REFLEX MICROSCOPIC
Bilirubin Urine: NEGATIVE
Hgb urine dipstick: NEGATIVE
Leukocytes,Ua: NEGATIVE
Nitrite: NEGATIVE
RBC / HPF: NONE SEEN (ref 0–?)
Specific Gravity, Urine: 1.02 (ref 1.000–1.030)
Total Protein, Urine: NEGATIVE
Urine Glucose: NEGATIVE
Urobilinogen, UA: 0.2 (ref 0.0–1.0)
pH: 6.5 (ref 5.0–8.0)

## 2019-11-16 LAB — BASIC METABOLIC PANEL
BUN: 20 mg/dL (ref 6–23)
CO2: 29 mEq/L (ref 19–32)
Calcium: 9.4 mg/dL (ref 8.4–10.5)
Chloride: 102 mEq/L (ref 96–112)
Creatinine, Ser: 1.11 mg/dL (ref 0.40–1.50)
GFR: 62.51 mL/min (ref >60.00)
Glucose, Bld: 105 mg/dL — ABNORMAL HIGH (ref 70–99)
Potassium: 4.6 mEq/L (ref 3.5–5.1)
Sodium: 138 mEq/L (ref 135–145)

## 2019-11-16 LAB — CBC WITH DIFFERENTIAL/PLATELET
Basophils Absolute: 0 10*3/uL (ref 0.0–0.1)
Basophils Relative: 0.6 % (ref 0.0–3.0)
Eosinophils Absolute: 0.2 10*3/uL (ref 0.0–0.7)
Eosinophils Relative: 4.6 % (ref 0.0–5.0)
HCT: 35.4 % — ABNORMAL LOW (ref 39.0–52.0)
Hemoglobin: 12 g/dL — ABNORMAL LOW (ref 13.0–17.0)
Lymphocytes Relative: 15.3 % (ref 12.0–46.0)
Lymphs Abs: 0.7 10*3/uL (ref 0.7–4.0)
MCHC: 34 g/dL (ref 30.0–36.0)
MCV: 109.7 fl — ABNORMAL HIGH (ref 78.0–100.0)
Monocytes Absolute: 0.7 10*3/uL (ref 0.1–1.0)
Monocytes Relative: 15.9 % — ABNORMAL HIGH (ref 3.0–12.0)
Neutro Abs: 2.7 10*3/uL (ref 1.4–7.7)
Neutrophils Relative %: 63.6 % (ref 43.0–77.0)
Platelets: 242 10*3/uL (ref 150.0–400.0)
RBC: 3.23 Mil/uL — ABNORMAL LOW (ref 4.22–5.81)
RDW: 14.1 % (ref 11.5–15.5)
WBC: 4.3 10*3/uL (ref 4.0–10.5)

## 2019-11-16 LAB — HEPATIC FUNCTION PANEL
ALT: 19 U/L (ref 0–53)
AST: 27 U/L (ref 0–37)
Albumin: 3.6 g/dL (ref 3.5–5.2)
Alkaline Phosphatase: 62 U/L (ref 39–117)
Bilirubin, Direct: 0.2 mg/dL (ref 0.0–0.3)
Total Bilirubin: 1 mg/dL (ref 0.2–1.2)
Total Protein: 6.3 g/dL (ref 6.0–8.3)

## 2019-11-16 LAB — TSH: TSH: 2.62 u[IU]/mL (ref 0.35–4.50)

## 2019-11-16 NOTE — Patient Instructions (Signed)

## 2019-11-16 NOTE — Progress Notes (Signed)
Subjective:    Patient ID: Ronald [name], male    DOB: [birth year], 86 y.o.   MRN: [id number]  HPI  Here for wellness and f/u;  Overall doing ok;  Pt denies Chest pain, worsening SOB, DOE, wheezing, orthopnea, PND, worsening LE edema, palpitations, dizziness or syncope.  Pt denies neurological change such as new headache, facial or extremity weakness.  Pt denies polydipsia, polyuria, or low sugar symptoms. Pt states overall good compliance with treatment and medications, good tolerability, and has been trying to follow appropriate diet.  Pt denies worsening depressive symptoms, suicidal ideation or panic. No fever, night sweats, wt loss, loss of appetite, or other constitutional symptoms.  Pt states good ability with ADL's, has low fall risk, home safety reviewed and adequate, no other significant changes in hearing or vision, and only occasionally active with exercise. S/p covid shot x 2.  Did see Dr Tamala Julian for left shoulder pain s/p cortisone and pain better bu still cannot raise overhead, plans to f/u with him after he initially thought he would not need to. Past Medical History:  Diagnosis Date  . ALLERGIC RHINITIS 11/25/2007  . ANXIETY 11/25/2007  . COLONIC POLYPS, HX OF 11/25/2007  . CORONARY ARTERY DISEASE 11/25/2007  . Cough 01/04/2009  . HEMORRHOIDS, INTERNAL 11/06/2010  . HOARSENESS 01/04/2009  . HYPERLIPIDEMIA 11/25/2007  . HYPERTENSION 11/25/2007  . Impaired glucose tolerance 04/21/2013  . LOW BACK PAIN 11/25/2007  . Lumbar radiculopathy, chronic    with right foot drop  . PEPTIC ULCER DISEASE 11/25/2007  . 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 Preventative health care 04/07/2011  . PSA, INCREASED 01/05/2009  . Right foot drop   . Spasmodic dysphonia   . TREMOR, ESSENTIAL 11/25/2007   Past Surgical History:  Procedure Laterality Date  . APPENDECTOMY    . BACK SURGERY    . CORONARY ARTERY BYPASS GRAFT    . s/p lumbar laminectomy and fusion    . TONSILLECTOMY        reports that he has never smoked. He has never used smokeless tobacco. He reports current alcohol use. He reports that he does not use drugs. family history includes Colon cancer in his father. No Known Allergies Current Outpatient Medications on File Prior to Visit  Medication Sig Dispense Refill  . aspirin 81 MG tablet Take 81 mg by mouth daily.      [name] atorvastatin (LIPITOR) 40 MG tablet TAKE 1 TABLET BY MOUTH ONCE DAILY 6 IN THE EVENING 90 tablet 1  . b complex vitamins tablet Take 1 tablet by mouth daily.    . Cholecalciferol (VITAMIN D) 2000 UNITS CAPS Take 2,000 Units by mouth daily.    . clotrimazole-betamethasone (LOTRISONE) cream Use as directed twice per day as needed 45 g 1  . Coenzyme Q10 (COQ10 PO) Take 1 tablet by mouth daily.     . furosemide (LASIX) 20 MG tablet Take 1 tablet (20 mg total) by mouth daily. 90 tablet 3  . irbesartan (AVAPRO) 150 MG tablet Take 150 mg by mouth daily.    . OMEGA 3 1200 MG CAPS Take 1 capsule by mouth 2 (two) times daily.     . propranolol ER (INDERAL LA) 60 MG 24 hr capsule Take 1 capsule (60 mg total) by mouth daily. 90 capsule 3  . telmisartan (MICARDIS) 40 MG tablet Take 1 tablet (40 mg total) by mouth daily. 90 tablet 3   No current facility-administered medications on file prior to visit.  Review of Systems All otherwise neg per pt     Objective:   Physical Exam BP 134/82 (BP Location: Right Arm, Patient Position: Sitting, Cuff Size: Normal)   Pulse 63   Temp 98.1 F (36.7 C) (Oral)   Ht 5\' 10"  (1.778 m)   Wt 180 lb (81.6 kg)   SpO2 99%   BMI 25.83 kg/m  VS noted,  Constitutional: Pt appears in NAD HENT: Head: NCAT.  Right Ear: External ear normal.  Left Ear: External ear normal.  Eyes: . Pupils are equal, round, and reactive to light. Conjunctivae and EOM are normal Nose: without d/c or deformity Neck: Neck supple. Gross normal ROM Cardiovascular: Normal rate and regular rhythm.   Pulmonary/Chest: Effort normal and  breath sounds without rales or wheezing.  Abd:  Soft, NT, ND, + BS, no organomegaly Neurological: Pt is alert. At baseline orientation, motor grossly intact Skin: Skin is warm. No rashes, other new lesions, no LE edema Psychiatric: Pt behavior is normal without agitation  All otherwise neg per pt Lab Results  Component Value Date   WBC 4.3 11/16/2019   HGB 12.0 (L) 11/16/2019   HCT 35.4 (L) 11/16/2019   PLT 242.0 11/16/2019   GLUCOSE 105 (H) 11/16/2019   CHOL 132 11/16/2019   TRIG 64.0 11/16/2019   HDL 58.80 11/16/2019   LDLCALC 61 11/16/2019   ALT 19 11/16/2019   AST 27 11/16/2019   NA 138 11/16/2019   K 4.6 11/16/2019   CL 102 11/16/2019   CREATININE 1.11 11/16/2019   BUN 20 11/16/2019   CO2 29 11/16/2019   TSH 2.62 11/16/2019   PSA 2.00 01/25/2010   HGBA1C 6.1 05/14/2019      Assessment & Plan:

## 2019-11-20 ENCOUNTER — Encounter: Payer: Self-pay | Admitting: Internal Medicine

## 2019-11-20 NOTE — Assessment & Plan Note (Signed)

## 2019-12-21 NOTE — Progress Notes (Signed)
Assessment/Plan:    1.  Essential Tremor  -continue inderal LA, 60 mg daily  2 memory change  -Offered neurocognitive testing and he declined for now.  He will let me know if he changes his mind.  3. Follow-up 1 year  Subjective:   Ronald [name] was seen today in follow up for essential tremor.  My previous records were reviewed prior to todays visit.   Tremor has been stable over the past year.  Patient thinks that Inderal has been very helpful.  Pt denies falls.  Pt denies lightheadedness, near syncope.  No hallucinations.  Mood has been good.  Medical records have been reviewed since last visit.  Last saw cardiology in December.  Pt asks if his "brain support" supplement is helpful for his memory.  He has trouble remembering the many birthdays of his many children/grandchildren and sometimes has trouble with names of them.  He has no trouble with remembering medication.  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  He pays household bills without trouble but admits that "I have joined the autopay system."  Current prescribed movement disorder medications: inderal LA, 60 mg daily    ALLERGIES:  No Known Allergies  CURRENT MEDICATIONS:  Outpatient Encounter Medications as of 12/23/2019  Medication Sig  . aspirin 81 MG tablet Take 81 mg by mouth daily.    [name] atorvastatin (LIPITOR) 40 MG tablet TAKE 1 TABLET BY MOUTH ONCE DAILY 6 IN THE EVENING  . b complex vitamins tablet Take 1 tablet by mouth daily.  . Cholecalciferol (VITAMIN D) 2000 UNITS CAPS Take 2,000 Units by mouth daily.  . clotrimazole-betamethasone (LOTRISONE) cream Use as directed twice per day as needed  . Coenzyme Q10 (COQ10 PO) Take 1 tablet by mouth daily.   . furosemide (LASIX) 20 MG tablet Take 1 tablet (20 mg total) by mouth daily.  . irbesartan (AVAPRO) 150 MG tablet Take 150 mg by mouth daily.  . OMEGA 3 1200 MG CAPS Take 1 capsule by mouth 2 (two) times daily.   . propranolol ER (INDERAL LA) 60 MG 24 hr capsule  Take 1 capsule (60 mg total) by mouth daily.  [name] telmisartan (MICARDIS) 40 MG tablet Take 1 tablet (40 mg total) by mouth daily.   No facility-administered encounter medications on file as of 12/23/2019.     Objective:    PHYSICAL EXAMINATION:    VITALS:   Vitals:   12/23/19 0807  BP: (!) 151/78  Pulse: 77  SpO2: 96%  Weight: 179 lb (81.2 kg)  Height: 5\' 10"  (1.778 m)    GEN:  The patient appears stated age and is in NAD. HEENT:  Normocephalic, atraumatic.  The mucous membranes are moist. The superficial temporal arteries are without ropiness or tenderness. CV:  RRR Lungs:  CTAB Neck/HEME:  There are no carotid bruits bilaterally.  Neurological examination:  Orientation: The patient is alert and oriented x3. Cranial nerves: There is good facial symmetry. The speech is fluent and clear. Soft palate rises symmetrically and there is no tongue deviation. Hearing is intact to conversational tone. Sensation: Sensation is intact to light touch throughout Motor: Strength is at least antigravity x4.  Movement examination: Tone: There is normal tone in the UE/LE Abnormal movements: head tremor in the "yes" direction. There is right greater than left upper extremity postural tremor. There is some mild rest tremor in the right hand. Coordination:  There is no decremation with RAM's,  Gait and Station: The patient easily rises  out of the chair. He has right foot drop (chronic) still has a marching type of gait on the right.  I have reviewed and interpreted the following labs independently   Chemistry      Component Value Date/Time   NA 138 11/16/2019 0932   NA 140 04/01/2017 1033   K 4.6 11/16/2019 0932   CL 102 11/16/2019 0932   CO2 29 11/16/2019 0932   BUN 20 11/16/2019 0932   BUN 24 04/01/2017 1033   CREATININE 1.11 11/16/2019 0932      Component Value Date/Time   CALCIUM 9.4 11/16/2019 0932   ALKPHOS 62 11/16/2019 0932   AST 27 11/16/2019 0932   ALT 19 11/16/2019 0932    BILITOT 1.0 11/16/2019 0932     Lab Results  Component Value Date   TSH 2.62 11/16/2019   Lab Results  Component Value Date   VITAMINB12 1,070 (H) 05/14/2019      Cc:  [name], MD

## 2019-12-23 ENCOUNTER — Encounter: Payer: Self-pay | Admitting: Neurology

## 2019-12-23 ENCOUNTER — Other Ambulatory Visit: Payer: Self-pay

## 2019-12-23 ENCOUNTER — Ambulatory Visit: Payer: Medicare Other | Admitting: Neurology

## 2019-12-23 VITALS — BP 151/78 | HR 77 | Ht 70.0 in | Wt 179.0 lb

## 2019-12-23 DIAGNOSIS — G250 Essential tremor: Secondary | ICD-10-CM | POA: Diagnosis not present

## 2019-12-23 DIAGNOSIS — R413 Other amnesia: Secondary | ICD-10-CM | POA: Diagnosis not present

## 2019-12-23 MED ORDER — PROPRANOLOL HCL ER 60 MG PO CP24: 60.0000 mg | ORAL_CAPSULE | Freq: Every day | ORAL | 3 refills | Status: DC

## 2019-12-23 NOTE — Patient Instructions (Signed)
1.  Let me know if you decide to do the neurocognitive (memory) testing.  It takes about 1.5-2 hours to get done.  The physicians and staff at [facility] are committed to providing excellent care. You may receive a survey requesting feedback about your experience at our office. We strive to receive "very good" responses to the survey questions. If you feel that your experience would prevent you from giving the office a "very good " response, please contact our office to try to remedy the situation. We may be reached at [phone]. Thank you for taking the time out of your busy day to complete the survey.

## 2020-01-07 ENCOUNTER — Ambulatory Visit: Payer: Medicare Other | Admitting: Neurology

## 2020-02-10 ENCOUNTER — Other Ambulatory Visit: Payer: Self-pay | Admitting: Internal Medicine

## 2020-03-23 ENCOUNTER — Other Ambulatory Visit: Payer: Self-pay | Admitting: Cardiovascular Disease

## 2020-05-03 ENCOUNTER — Other Ambulatory Visit: Payer: Self-pay | Admitting: Internal Medicine

## 2020-05-18 ENCOUNTER — Ambulatory Visit: Payer: Medicare Other | Admitting: Internal Medicine

## 2020-05-26 ENCOUNTER — Other Ambulatory Visit: Payer: Self-pay

## 2020-05-26 ENCOUNTER — Ambulatory Visit (INDEPENDENT_AMBULATORY_CARE_PROVIDER_SITE_OTHER): Payer: Medicare Other | Admitting: Internal Medicine

## 2020-05-26 ENCOUNTER — Encounter: Payer: Self-pay | Admitting: Internal Medicine

## 2020-05-26 VITALS — BP 130/68 | HR 67 | Temp 98.7°F | Ht 70.0 in | Wt 176.5 lb

## 2020-05-26 DIAGNOSIS — D692 Other nonthrombocytopenic purpura: Secondary | ICD-10-CM | POA: Diagnosis not present

## 2020-05-26 DIAGNOSIS — E785 Hyperlipidemia, unspecified: Secondary | ICD-10-CM

## 2020-05-26 DIAGNOSIS — R7302 Impaired glucose tolerance (oral): Secondary | ICD-10-CM | POA: Diagnosis not present

## 2020-05-26 DIAGNOSIS — I10 Essential (primary) hypertension: Secondary | ICD-10-CM | POA: Diagnosis not present

## 2020-05-26 NOTE — Patient Instructions (Signed)
Please continue all other medications as before, and refills have been done if requested.  Please have the pharmacy call with any other refills you may need.  Please continue your efforts at being more active, low cholesterol diet, and weight control.  Please keep your appointments with your specialists as you may have planned  Please make an Appointment to return in 6 months, or sooner if needed 

## 2020-05-26 NOTE — Progress Notes (Signed)
Subjective:    Patient ID: Ronald [name], male    DOB: 1930/04/04, 86 y.o.   MRN: [id number]  HPI  Here to f/u; overall doing ok,  Pt denies chest pain, increasing sob or doe, wheezing, orthopnea, PND, increased LE swelling, palpitations, dizziness or syncope.  Pt denies new neurological symptoms such as new headache, or facial or extremity weakness or numbness.  Pt denies polydipsia, polyuria, or low sugar episode.  Pt states overall good compliance with meds, mostly trying to follow appropriate diet, with wt overall stable,  but little exercise however. Sees [name] and Dr Eden Emms but overall doing ok and wondering if really needs to keep up.  Biggest problem today is senile purpura and balance issue.  No falls has a cane but not using for the most part.  Has several new brusing to arms without trauma Past Medical History:  Diagnosis Date  . ALLERGIC RHINITIS 11/25/2007  . ANXIETY 11/25/2007  . COLONIC POLYPS, HX OF 11/25/2007  . CORONARY ARTERY DISEASE 11/25/2007  . Cough 01/04/2009  . HEMORRHOIDS, INTERNAL 11/06/2010  . HOARSENESS 01/04/2009  . HYPERLIPIDEMIA 11/25/2007  . HYPERTENSION 11/25/2007  . Impaired glucose tolerance 04/21/2013  . LOW BACK PAIN 11/25/2007  . Lumbar radiculopathy, chronic    with right foot drop  . PEPTIC ULCER DISEASE 11/25/2007  . 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 Preventative health care 04/07/2011  . PSA, INCREASED 01/05/2009  . Right foot drop   . Spasmodic dysphonia   . TREMOR, ESSENTIAL 11/25/2007   Past Surgical History:  Procedure Laterality Date  . APPENDECTOMY    . BACK SURGERY    . CORONARY ARTERY BYPASS GRAFT    . s/p lumbar laminectomy and fusion    . TONSILLECTOMY      reports that he has never smoked. He has never used smokeless tobacco. He reports current alcohol use. He reports that he does not use drugs. family history includes Colon cancer in his father. No Known Allergies Current Outpatient Medications on File  Prior to Visit  Medication Sig Dispense Refill  . aspirin 81 MG tablet Take 81 mg by mouth daily.      [name] atorvastatin (LIPITOR) 40 MG tablet TAKE 1 TABLET BY MOUTH ONCE DAILY IN THE EVENING 90 tablet 0  . b complex vitamins tablet Take 1 tablet by mouth daily.    . Cholecalciferol (VITAMIN D) 2000 UNITS CAPS Take 2,000 Units by mouth daily.    . clotrimazole-betamethasone (LOTRISONE) cream Use as directed twice per day as needed 45 g 1  . Coenzyme Q10 (COQ10 PO) Take 1 tablet by mouth daily.     . furosemide (LASIX) 20 MG tablet Take 1 tablet by mouth once daily 90 tablet 1  . OMEGA 3 1200 MG CAPS Take 1 capsule by mouth 2 (two) times daily.     . propranolol ER (INDERAL LA) 60 MG 24 hr capsule Take 1 capsule (60 mg total) by mouth daily. 90 capsule 3  . telmisartan (MICARDIS) 40 MG tablet Take 1 tablet (40 mg total) by mouth daily. 90 tablet 3   No current facility-administered medications on file prior to visit.   Review of Systems All otherwise neg per pt    Objective:   Physical Exam BP 130/68 (BP Location: Left Arm, Patient Position: Sitting, Cuff Size: Large)   Pulse 67   Temp 98.7 F (37.1 C) (Oral)   Ht 5\' 10"  (1.778 m)   Wt 176 lb  8 oz (80.1 kg)   SpO2 95%   BMI 25.33 kg/m  VS noted,  Constitutional: Pt appears in NAD HENT: Head: NCAT.  Right Ear: External ear normal.  Left Ear: External ear normal.  Eyes: . Pupils are equal, round, and reactive to light. Conjunctivae and EOM are normal Nose: without d/c or deformity Neck: Neck supple. Gross normal ROM Cardiovascular: Normal rate and regular rhythm.   Pulmonary/Chest: Effort normal and breath sounds without rales or wheezing.  Abd:  Soft, NT, ND, + BS, no organomegaly Neurological: Pt is alert. At baseline orientation, motor grossly intact Skin: Skin is warm. No rashes, other new lesions, no LE edema Psychiatric: Pt behavior is normal without agitation  All otherwise neg per pt Lab Results  Component Value Date     WBC 4.3 11/16/2019   HGB 12.0 (L) 11/16/2019   HCT 35.4 (L) 11/16/2019   PLT 242.0 11/16/2019   GLUCOSE 105 (H) 11/16/2019   CHOL 132 11/16/2019   TRIG 64.0 11/16/2019   HDL 58.80 11/16/2019   LDLCALC 61 11/16/2019   ALT 19 11/16/2019   AST 27 11/16/2019   NA 138 11/16/2019   K 4.6 11/16/2019   CL 102 11/16/2019   CREATININE 1.11 11/16/2019   BUN 20 11/16/2019   CO2 29 11/16/2019   TSH 2.62 11/16/2019   PSA 2.00 01/25/2010   HGBA1C 6.1 05/14/2019       Assessment & Plan:

## 2020-05-27 ENCOUNTER — Encounter: Payer: Self-pay | Admitting: Internal Medicine

## 2020-05-27 DIAGNOSIS — D692 Other nonthrombocytopenic purpura: Secondary | ICD-10-CM | POA: Insufficient documentation

## 2020-05-27 NOTE — Assessment & Plan Note (Signed)
stable overall by history and exam, recent data reviewed with pt, and pt to continue medical treatment as before,  to f/u any worsening symptoms or concerns  

## 2020-05-27 NOTE — Assessment & Plan Note (Addendum)
I spent 31 minutes in preparing to see the patient by review of recent labs, imaging and procedures, obtaining and reviewing separately obtained history, communicating with the patient and family or caregiver, ordering medications, tests or procedures, and documenting clinical information in the EHR including the differential Dx, treatment, and any further evaluation and other management of senile purpura, hyperglycemi, htn, hld

## 2020-08-03 ENCOUNTER — Other Ambulatory Visit: Payer: Self-pay | Admitting: Internal Medicine

## 2020-08-03 NOTE — Telephone Encounter (Signed)
Please refill as per office routine med refill policy (all routine meds refilled for 3 mo or monthly per pt preference up to one year from last visit, then month to month grace period for 3 mo, then further med refills will have to be denied)  

## 2020-08-24 NOTE — Progress Notes (Signed)
Date:  08/28/2020   ID:  Ernst Bowler, DOB 10/12/1929, MRN [id number]  Provider Location: Office  PCP:  [name], MD  Cardiologist:   Eden Emms Electrophysiologist:  None   Evaluation Performed:  Follow-Up Visit  Chief Complaint:  CAD/CABG  History of Present Illness:    86 y.o. history of CABG 1998 Cath 2003 with occluded SVG RCA Rx medically Activity limited by bilateral foot drop. No angina. Wife is battling lung cancer. Uses lasix for LE edema. 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 and exercising a couple of hours / day Seeing Dr Katrinka Blazing for injured left shoulder Has had steroid injection with relief Wife has had lung cancer for 10 years on [place]   No angina  LDL at goal on statin   Has senile purura/bruising with poor balance using cane No falls PLT ok at 27   Sees [name] neurology for tremor and memory changes on Inderal and declined cognitive testing   Doing very well with no angina Wife's health is very bad Has lung cancer and full time care Has a son Ree Kida who is a GP in [place]   Past Medical History:  Diagnosis Date   ALLERGIC RHINITIS 11/25/2007   ANXIETY 11/25/2007   COLONIC POLYPS, HX OF 11/25/2007   CORONARY ARTERY DISEASE 11/25/2007   Cough 01/04/2009   HEMORRHOIDS, INTERNAL 11/06/2010   HOARSENESS 01/04/2009   HYPERLIPIDEMIA 11/25/2007   HYPERTENSION 11/25/2007   Impaired glucose tolerance 04/21/2013   LOW BACK PAIN 11/25/2007   Lumbar radiculopathy, chronic    with right foot drop   PEPTIC ULCER DISEASE 11/25/2007   Peroneal nerve lesion of lower extremity, 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   atorvastatin (LIPITOR) 40 MG tablet TAKE 1 TABLET BY MOUTH ONCE DAILY IN THE EVENING   b complex vitamins tablet Take 1 tablet by mouth daily.   Cholecalciferol (VITAMIN D) 2000 UNITS CAPS Take 2,000 Units by mouth daily.   clotrimazole-betamethasone (LOTRISONE) cream Use as directed twice per day as needed   Coenzyme Q10 (COQ10 PO) Take 1 tablet by mouth daily.    furosemide (LASIX) 20 MG tablet Take 1 tablet by mouth once daily   OMEGA 3 1200 MG CAPS Take 1 capsule by mouth 2 (two) times daily.    propranolol ER (INDERAL LA) 60 MG 24 hr capsule Take 1 capsule (60 mg total) by mouth daily.   telmisartan (MICARDIS) 40 MG tablet Take 1 tablet (40 mg total) by mouth daily.     Allergies:   Patient has no known allergies.   Social History   Tobacco Use   Smoking status: Never Smoker   Smokeless tobacco: Never Used  Vaping Use   Vaping Use: Never used  Substance Use Topics   Alcohol use: Yes    Comment: Occ   Drug use: No     Family Hx: The patient's family history  includes Colon cancer in his father.  ROS:   Please see the history of present illness.     All other systems reviewed and are negative.   Prior CV studies:   The following studies were reviewed today:  Myvue 2008 Carotid 2012   Labs/Other Tests and Data Reviewed:    EKG:  SR LAD / RBBB rate 60 no acute changes 08/28/2020   Recent Labs: 11/16/2019: ALT 19; BUN 20; Creatinine, Ser 1.11; Hemoglobin 12.0; Platelets 242.0; Potassium 4.6; Sodium 138; TSH 2.62   Recent Lipid Panel Lab Results  Component Value Date/Time   CHOL 132 11/16/2019 09:32 AM   TRIG 64.0 11/16/2019 09:32 AM   HDL 58.80 11/16/2019 09:32 AM   CHOLHDL 2 11/16/2019 09:32 AM   LDLCALC 61 11/16/2019 09:32 AM    Wt Readings from Last 3 Encounters:  08/28/20 80.3 kg  05/26/20 80.1 kg   12/23/19 81.2 kg     Objective:    Vital Signs:  BP 116/62    Pulse 60    Ht 5\' 10"  (1.778 m)    Wt 80.3 kg    SpO2 96%    BMI 25.40 kg/m    Affect appropriate Healthy:  appears stated age HEENT: normal Neck supple with no adenopathy JVP normal no bruits no thyromegaly Lungs clear with no wheezing and good diaphragmatic motion Heart:  S1/S2 no murmur, no rub, gallop or click PMI normal post sternotomy  Abdomen: benighn, BS positve, no tenderness, no AAA no bruit.  No HSM or HJR Distal pulses intact with no bruits Trace LE  edema Neuro non-focal head tremor in yes direction  Skin warm and dry Chronic right foot drop    ASSESSMENT & PLAN:    1. CAD/CABG:  CABG 98 cath 2003 occluded RCA graft.  Myovue 2008 non ischemic Active working out at gym everyday no symptoms continue medical Rx stable  2. HTN: Well controlled.  Continue current medications and low sodium Dash type diet.  Inderal/Micardis   3. Chol:  - at goal LDL 61 11/16/19 continue statin   4. Bifasicular block:  stable no high grade AV block yearly ECG   5. Edema:  Dependant continue lasix low sodium diet labs with primary   6. Ortho:  F/u with Dr 01/16/20 for injured left shoulder still with some restriction to motion    7. Neuro:  F/u [name] tremor and right foot drop with some cognitive impairment    COVID-19 Education: The signs and symptoms of COVID-19 were discussed with the patient and how to seek care for testing (follow up with PCP or arrange E-visit).  The importance of social distancing was discussed today.   Medication Adjustments/Labs and Tests Ordered: Current medicines are reviewed at length with the patient today.  Concerns regarding medicines are outlined above.   Tests Ordered:  None   Medication Changes:  None   Disposition:  Follow up in a year  Signed, Katrinka Blazing, MD  08/28/2020 8:46 AM    [facility]

## 2020-08-28 ENCOUNTER — Encounter: Payer: Self-pay | Admitting: Cardiovascular Disease

## 2020-08-28 ENCOUNTER — Other Ambulatory Visit: Payer: Self-pay

## 2020-08-28 ENCOUNTER — Ambulatory Visit: Payer: Medicare Other | Admitting: Cardiovascular Disease

## 2020-08-28 VITALS — BP 116/62 | HR 60 | Ht 70.0 in | Wt 177.0 lb

## 2020-08-28 DIAGNOSIS — I25810 Atherosclerosis of coronary artery bypass graft(s) without angina pectoris: Secondary | ICD-10-CM | POA: Diagnosis not present

## 2020-08-28 NOTE — Patient Instructions (Signed)

## 2020-09-19 ENCOUNTER — Other Ambulatory Visit: Payer: Self-pay | Admitting: Cardiovascular Disease

## 2020-10-26 ENCOUNTER — Other Ambulatory Visit: Payer: Self-pay | Admitting: Internal Medicine

## 2020-10-26 NOTE — Telephone Encounter (Signed)
Please refill as per office routine med refill policy (all routine meds refilled for 3 mo or monthly per pt preference up to one year from last visit, then month to month grace period for 3 mo, then further med refills will have to be denied)  

## 2020-11-20 ENCOUNTER — Ambulatory Visit: Payer: Medicare Other | Admitting: Internal Medicine

## 2020-11-24 ENCOUNTER — Other Ambulatory Visit: Payer: Self-pay

## 2020-11-27 ENCOUNTER — Other Ambulatory Visit: Payer: Self-pay

## 2020-11-27 ENCOUNTER — Encounter: Payer: Self-pay | Admitting: Internal Medicine

## 2020-11-27 ENCOUNTER — Ambulatory Visit (INDEPENDENT_AMBULATORY_CARE_PROVIDER_SITE_OTHER): Payer: Medicare Other | Admitting: Internal Medicine

## 2020-11-27 VITALS — BP 124/66 | HR 62 | Temp 98.1°F | Ht 70.0 in | Wt 179.0 lb

## 2020-11-27 DIAGNOSIS — R2689 Other abnormalities of gait and mobility: Secondary | ICD-10-CM

## 2020-11-27 DIAGNOSIS — M5416 Radiculopathy, lumbar region: Secondary | ICD-10-CM | POA: Diagnosis not present

## 2020-11-27 DIAGNOSIS — F0390 Unspecified dementia without behavioral disturbance: Secondary | ICD-10-CM

## 2020-11-27 DIAGNOSIS — E559 Vitamin D deficiency, unspecified: Secondary | ICD-10-CM | POA: Diagnosis not present

## 2020-11-27 DIAGNOSIS — R7302 Impaired glucose tolerance (oral): Secondary | ICD-10-CM | POA: Diagnosis not present

## 2020-11-27 DIAGNOSIS — E785 Hyperlipidemia, unspecified: Secondary | ICD-10-CM | POA: Diagnosis not present

## 2020-11-27 DIAGNOSIS — H9193 Unspecified hearing loss, bilateral: Secondary | ICD-10-CM

## 2020-11-27 DIAGNOSIS — Z0001 Encounter for general adult medical examination with abnormal findings: Secondary | ICD-10-CM | POA: Diagnosis not present

## 2020-11-27 DIAGNOSIS — F0280 Dementia in other diseases classified elsewhere without behavioral disturbance: Secondary | ICD-10-CM | POA: Insufficient documentation

## 2020-11-27 LAB — CBC WITH DIFFERENTIAL/PLATELET
Basophils Absolute: 0 10*3/uL (ref 0.0–0.1)
Basophils Relative: 0.4 % (ref 0.0–3.0)
Eosinophils Absolute: 0.2 10*3/uL (ref 0.0–0.7)
Eosinophils Relative: 5.1 % — ABNORMAL HIGH (ref 0.0–5.0)
HCT: 35.9 % — ABNORMAL LOW (ref 39.0–52.0)
Hemoglobin: 12.3 g/dL — ABNORMAL LOW (ref 13.0–17.0)
Lymphocytes Relative: 19.7 % (ref 12.0–46.0)
Lymphs Abs: 0.9 10*3/uL (ref 0.7–4.0)
MCHC: 34.3 g/dL (ref 30.0–36.0)
MCV: 109.8 fl — ABNORMAL HIGH (ref 78.0–100.0)
Monocytes Absolute: 0.6 10*3/uL (ref 0.1–1.0)
Monocytes Relative: 12.5 % — ABNORMAL HIGH (ref 3.0–12.0)
Neutro Abs: 2.8 10*3/uL (ref 1.4–7.7)
Neutrophils Relative %: 62.3 % (ref 43.0–77.0)
Platelets: 211 10*3/uL (ref 150.0–400.0)
RBC: 3.27 Mil/uL — ABNORMAL LOW (ref 4.22–5.81)
RDW: 13.8 % (ref 11.5–15.5)
WBC: 4.5 10*3/uL (ref 4.0–10.5)

## 2020-11-27 LAB — BASIC METABOLIC PANEL
BUN: 22 mg/dL (ref 6–23)
CO2: 31 mEq/L (ref 19–32)
Calcium: 9.3 mg/dL (ref 8.4–10.5)
Chloride: 103 mEq/L (ref 96–112)
Creatinine, Ser: 1.13 mg/dL (ref 0.40–1.50)
GFR: 57.79 mL/min — ABNORMAL LOW (ref >60.00)
Glucose, Bld: 107 mg/dL — ABNORMAL HIGH (ref 70–99)
Potassium: 4.4 mEq/L (ref 3.5–5.1)
Sodium: 138 mEq/L (ref 135–145)

## 2020-11-27 LAB — HEPATIC FUNCTION PANEL
ALT: 17 U/L (ref 0–53)
AST: 21 U/L (ref 0–37)
Albumin: 3.8 g/dL (ref 3.5–5.2)
Alkaline Phosphatase: 53 U/L (ref 39–117)
Bilirubin, Direct: 0.2 mg/dL (ref 0.0–0.3)
Total Bilirubin: 1.2 mg/dL (ref 0.2–1.2)
Total Protein: 6.4 g/dL (ref 6.0–8.3)

## 2020-11-27 LAB — URINALYSIS, ROUTINE W REFLEX MICROSCOPIC
Bilirubin Urine: NEGATIVE
Hgb urine dipstick: NEGATIVE
Ketones, ur: NEGATIVE
Leukocytes,Ua: NEGATIVE
Nitrite: NEGATIVE
RBC / HPF: NONE SEEN (ref 0–?)
Specific Gravity, Urine: 1.01 (ref 1.000–1.030)
Total Protein, Urine: NEGATIVE
Urine Glucose: NEGATIVE
Urobilinogen, UA: 0.2 (ref 0.0–1.0)
pH: 7 (ref 5.0–8.0)

## 2020-11-27 LAB — LIPID PANEL
Cholesterol: 124 mg/dL (ref 0–200)
HDL: 58.6 mg/dL (ref >39.00)
LDL Cholesterol: 53 mg/dL (ref 0–99)
NonHDL: 65.11
Total CHOL/HDL Ratio: 2
Triglycerides: 62 mg/dL (ref 0.0–149.0)
VLDL: 12.4 mg/dL (ref 0.0–40.0)

## 2020-11-27 LAB — VITAMIN D 25 HYDROXY (VIT D DEFICIENCY, FRACTURES): VITD: 45.25 ng/mL (ref 30.00–100.00)

## 2020-11-27 LAB — VITAMIN B12: Vitamin B-12: 367 pg/mL (ref 211–911)

## 2020-11-27 LAB — HEMOGLOBIN A1C: Hgb A1c MFr Bld: 6.2 % (ref 4.6–6.5)

## 2020-11-27 LAB — TSH: TSH: 3.49 u[IU]/mL (ref 0.35–4.50)

## 2020-11-27 MED ORDER — DONEPEZIL HCL 5 MG PO TABS: 5.0000 mg | ORAL_TABLET | Freq: Every day | ORAL | 3 refills | Status: DC

## 2020-11-27 NOTE — Patient Instructions (Signed)
Please take all new medication as prescribed - the aricept 5 mg per day  Please continue all other medications as before, and refills have been done if requested.  Please have the pharmacy call with any other refills you may need.  Please continue your efforts at being more active, low cholesterol diet, and weight control.  You are otherwise up to date with prevention measures today.   Please keep your appointments with your specialists as you may have planned  Please go to the LAB at the blood drawing area for the tests to be done  You will be contacted by phone if any changes need to be made immediately.  Otherwise, you will receive a letter about your results with an explanation, but please check with MyChart first.  Please remember to sign up for MyChart if you have not done so, as this will be important to you in the future with finding out test results, communicating by private email, and scheduling acute appointments online when needed.  Please make an Appointment to return in 6 months, or sooner if needed

## 2020-11-27 NOTE — Assessment & Plan Note (Signed)
Declines further f/u sport med or ortho for now, or PT referral

## 2020-11-27 NOTE — Assessment & Plan Note (Signed)
Has cane, declines PT referral for now

## 2020-11-27 NOTE — Progress Notes (Signed)
Patient ID: Ronald [name], male   DOB: 06-21-1930, 87 y.o.   MRN: [id number]         Chief Complaint:: wellness exam and Follow-up  balance d/o, reduced hearing, worsenig memory loww and persistent 1 yr pain to left buttock       HPI:  Ronald [name] is a 87 y.o. male here for wellness exam with wife; declines flu shot, o/w up to date with preventive referrals, and immunizations                        Also c/o > 6 mo worsening memory loss with asking questions multiple times, and getting lost with driving, though only drives on lesser streets during the day.  Has had worsening balance difficulty and regards this as his largest problem, has a cane for support and no recent falls.  Also c/o persistent left buttock pain after sitting 1 yr ago with wallet for several hours, now with pain mild intermittent to LLE as well without other numbness or weakness.  Also has reduced hearing wondering about wax and does not want expensive hearing aids, but is willing to see audiology really bc wife insists.  Still working with 1 hr almost daily exercise.  Pt denies chest pain, increased sob or doe, wheezing, orthopnea, PND, increased LE swelling, palpitations, dizziness or syncope.  Pt denies polydipsia, polyuria,  Pt denies fever, wt loss, night sweats, loss of appetite, or other constitutional symptoms.  Has not other new focal neuro symptoms, has seen [name] in past, but does not want further f/u for now.    Wt Readings from Last 3 Encounters:  11/27/20 179 lb (81.2 kg)  08/28/20 177 lb (80.3 kg)  05/26/20 176 lb 8 oz (80.1 kg)   BP Readings from Last 3 Encounters:  11/27/20 124/66  08/28/20 116/62  05/26/20 130/68   Immunization History  Administered Date(s) Administered  . Fluad Quad(high Dose 65+) 05/14/2019  . H1N1 06/16/2008  . Influenza Split 05/28/2013  . Influenza, High Dose Seasonal PF 06/23/2017, 06/16/2018, 08/05/2018  . Influenza,inj,Quad PF,6+ Mos 06/21/2014, 05/07/2016  .  Influenza-Unspecified 06/15/2015  . Moderna Sars-Covid-2 Vaccination 11/09/2019, 11/30/2019  . PFIZER(Purple Top)SARS-COV-2 Vaccination 06/10/2020  . Pneumococcal Conjugate-13 04/28/2014  . Pneumococcal Polysaccharide-23 09/16/2004, 04/16/2011  . Td 01/04/2009  . Tdap 05/14/2019  . Zoster 08/17/2013  There are no preventive care reminders to display for this patient.    Past Medical History:  Diagnosis Date  . ALLERGIC RHINITIS 11/25/2007  . ANXIETY 11/25/2007  . COLONIC POLYPS, HX OF 11/25/2007  . CORONARY ARTERY DISEASE 11/25/2007  . Cough 01/04/2009  . HEMORRHOIDS, INTERNAL 11/06/2010  . HOARSENESS 01/04/2009  . HYPERLIPIDEMIA 11/25/2007  . HYPERTENSION 11/25/2007  . Impaired glucose tolerance 04/21/2013  . LOW BACK PAIN 11/25/2007  . Lumbar radiculopathy, chronic    with right foot drop  . PEPTIC ULCER DISEASE 11/25/2007  . 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 Preventative health care 04/07/2011  . PSA, INCREASED 01/05/2009  . Right foot drop   . Spasmodic dysphonia   . TREMOR, ESSENTIAL 11/25/2007   Past Surgical History:  Procedure Laterality Date  . APPENDECTOMY    . BACK SURGERY    . CORONARY ARTERY BYPASS GRAFT    . s/p lumbar laminectomy and fusion    . TONSILLECTOMY      reports that he has never smoked. He has never used smokeless tobacco. He reports current alcohol use. He  reports that he does not use drugs. family history includes Colon cancer in his father. No Known Allergies Current Outpatient Medications on File Prior to Visit  Medication Sig Dispense Refill  . aspirin 81 MG tablet Take 81 mg by mouth daily.    [name] atorvastatin (LIPITOR) 40 MG tablet TAKE 1 TABLET BY MOUTH ONCE DAILY IN THE EVENING 90 tablet 0  . b complex vitamins tablet Take 1 tablet by mouth daily.    . Cholecalciferol (VITAMIN D) 2000 UNITS CAPS Take 2,000 Units by mouth daily.    . clotrimazole-betamethasone (LOTRISONE) cream Use as directed twice per day as needed 45  g 1  . Coenzyme Q10 (COQ10 PO) Take 1 tablet by mouth daily.     . furosemide (LASIX) 20 MG tablet Take 1 tablet by mouth once daily 90 tablet 3  . OMEGA 3 1200 MG CAPS Take 1 capsule by mouth 2 (two) times daily.     . propranolol ER (INDERAL LA) 60 MG 24 hr capsule Take 1 capsule (60 mg total) by mouth daily. 90 capsule 3  . telmisartan (MICARDIS) 40 MG tablet Take 1 tablet by mouth once daily 90 tablet 0  . FLUZONE HIGH-DOSE QUADRIVALENT 0.7 ML SUSY      No current facility-administered medications on file prior to visit.        ROS:  All others reviewed and negative.  Objective        PE:  BP 124/66   Pulse 62   Temp 98.1 F (36.7 C) (Oral)   Ht 5\' 10"  (1.778 m)   Wt 179 lb (81.2 kg)   SpO2 93%   BMI 25.68 kg/m                 Constitutional: Pt appears in NAD               HENT: Head: NCAT.                Right Ear: External ear normal.                 Left Ear: External ear normal.                Eyes: . Pupils are equal, round, and reactive to light. Conjunctivae and EOM are normal               Nose: without d/c or deformity               Neck: Neck supple. Gross normal ROM               Cardiovascular: Normal rate and regular rhythm.                 Pulmonary/Chest: Effort normal and breath sounds without rales or wheezing.                Abd:  Soft, NT, ND, + BS, no organomegaly               Neurological: Pt is alert. At baseline orientation, motor grossly intact               Skin: Skin is warm. No rashes, no other new lesions, LE edema - none               Psychiatric: Pt behavior is normal without agitation   Micro: none  Cardiac tracings I have personally interpreted today:  none  Pertinent Radiological findings (summarize): none  Lab Results  Component Value Date   WBC 4.5 11/27/2020   HGB 12.3 (L) 11/27/2020   HCT 35.9 (L) 11/27/2020   PLT 211.0 11/27/2020   GLUCOSE 107 (H) 11/27/2020   CHOL 124 11/27/2020   TRIG 62.0 11/27/2020   HDL 58.60  11/27/2020   LDLCALC 53 11/27/2020   ALT 17 11/27/2020   AST 21 11/27/2020   NA 138 11/27/2020   K 4.4 11/27/2020   CL 103 11/27/2020   CREATININE 1.13 11/27/2020   BUN 22 11/27/2020   CO2 31 11/27/2020   TSH 3.49 11/27/2020   PSA 2.00 01/25/2010   HGBA1C 6.2 11/27/2020   Assessment/Plan:  Ronald [name] is a 87 y.o. White or Caucasian [1] male with  has a past medical history of ALLERGIC RHINITIS (11/25/2007), ANXIETY (11/25/2007), COLONIC POLYPS, HX OF (11/25/2007), CORONARY ARTERY DISEASE (11/25/2007), Cough (01/04/2009), HEMORRHOIDS, INTERNAL (11/06/2010), HOARSENESS (01/04/2009), HYPERLIPIDEMIA (11/25/2007), HYPERTENSION (11/25/2007), Impaired glucose tolerance (04/21/2013), LOW BACK PAIN (11/25/2007), Lumbar radiculopathy, chronic, PEPTIC ULCER DISEASE (11/25/2007), Peroneal nerve lesion of lower extremity, bilateral (05/08/2017), Preventative health care (04/07/2011), PSA, INCREASED (01/05/2009), Right foot drop, Spasmodic dysphonia, and TREMOR, ESSENTIAL (11/25/2007).  Encounter for well adult exam with abnormal findings Age and sex appropriate education and counseling updated with regular exercise and diet Referrals for preventative services - none needed Immunizations addressed - declines flu shot Smoking counseling  - none needed Evidence for depression or other mood disorder - none significant Most recent labs reviewed. I have personally reviewed and have noted: 1) the patient's medical and social history 2) The patient's current medications and supplements 3) The patient's height, weight, and BMI have been recorded in the chart   Balance disorder Has cane, declines PT referral for now  Bilateral hearing loss [facility] for audiology referral as reqeusted  Dementia (HCC) New worsening in past year - declines neurology f/u, neuropsych testing, MRI head, but will ry aricept 5 qd  Elevated lipids Lab Results  Component Value Date   LDLCALC 53 11/27/2020   Stable, pt to continue current  statin lipitor 40   Impaired glucose tolerance Lab Results  Component Value Date   HGBA1C 6.2 11/27/2020   Stable, pt to continue current medical treatment  - diet and wt control   Lumbar radiculopathy, chronic Declines further f/u sport med or ortho for now, or PT referral  Followup: Return in about 6 months (around 05/30/2021).  Oliver Barre, MD 11/27/2020 10:03 PM [facility]  Primary Care - [facility]

## 2020-11-27 NOTE — Assessment & Plan Note (Addendum)
Age and sex appropriate education and counseling updated with regular exercise and diet Referrals for preventative services - none needed Immunizations addressed - declines flu shot Smoking counseling  - none needed Evidence for depression or other mood disorder - none significant Most recent labs reviewed. I have personally reviewed and have noted: 1) the patient's medical and social history 2) The patient's current medications and supplements 3) The patient's height, weight, and BMI have been recorded in the chart  

## 2020-11-27 NOTE — Assessment & Plan Note (Signed)
Ok for audiology referral as reqeusted

## 2020-11-27 NOTE — Assessment & Plan Note (Signed)
Lab Results  Component Value Date   HGBA1C 6.2 11/27/2020   Stable, pt to continue current medical treatment  - diet and wt control  

## 2020-11-27 NOTE — Assessment & Plan Note (Signed)
New worsening in past year - declines neurology f/u, neuropsych testing, MRI head, but will ry aricept 5 qd

## 2020-11-27 NOTE — Assessment & Plan Note (Signed)
Lab Results  Component Value Date   LDLCALC 53 11/27/2020   Stable, pt to continue current statin lipitor 40

## 2020-12-21 ENCOUNTER — Ambulatory Visit: Payer: Medicare Other | Admitting: Neurology

## 2020-12-22 ENCOUNTER — Ambulatory Visit: Payer: Medicare Other | Admitting: Neurology

## 2020-12-26 NOTE — Progress Notes (Signed)
Assessment/Plan:    1.  Essential Tremor  -Patient will continue Inderal LA, 60 mg daily  2 memory change  -Primary care just started the patient on donepezil.  He is experiencing bad dreams with it and I told him to move it to the AM  -Encouraged the patient to consider neurocognitive testing.  He declines today but also denies any memory change.   I do think that there are memory issues arising  3.  Pt states difficulty getting to my office and we haven't changed anything in a while.  I refilled meds for a year but after that, he will f/u with PCP at his request.  Subjective:   Ronald [name] was seen today in follow up for essential tremor.  My previous records were reviewed prior to todays visit.   Patient doing well in terms of tremor.  Patient's tremor has been stable and not getting worse but he still notes tremor, esp with signing names.  No side effects with the Inderal LA.  Last visit, he talk to me about some memory change.  He did not want to do any neurocognitive testing at that point in time.  Since then, I do see that he had a primary care physician on March 14.  He was complaining about worsening memory change and getting lost while driving (he denies any of this today).  Looks like patient declines neurocognitive testing for primary care a few weeks ago as well and declined a follow-up here (although he already had this appointment set) but he was agreeable to starting donepezil.  Primary care notes also indicate that they were going to order an MRI brain, but I do not see that that order was ever entered.  Pt states that the donepezil "gives me trouble at night.  It causes bad dreams."    Current prescribed movement disorder medications: inderal LA, 60 mg daily    ALLERGIES:  No Known Allergies  CURRENT MEDICATIONS:  Outpatient Encounter Medications as of 01/02/2021  Medication Sig  . aspirin 81 MG tablet Take 81 mg by mouth daily.  [name] atorvastatin (LIPITOR) 40 MG  tablet TAKE 1 TABLET BY MOUTH ONCE DAILY IN THE EVENING  . b complex vitamins tablet Take 1 tablet by mouth daily.  . Cholecalciferol (VITAMIN D) 2000 UNITS CAPS Take 2,000 Units by mouth daily.  . clotrimazole-betamethasone (LOTRISONE) cream Use as directed twice per day as needed  . Coenzyme Q10 (COQ10 PO) Take 1 tablet by mouth daily.   [name] donepezil (ARICEPT) 5 MG tablet Take 1 tablet (5 mg total) by mouth at bedtime.  . furosemide (LASIX) 20 MG tablet Take 1 tablet by mouth once daily  . OMEGA 3 1200 MG CAPS Take 1 capsule by mouth 2 (two) times daily.   . propranolol ER (INDERAL LA) 60 MG 24 hr capsule Take 1 capsule (60 mg total) by mouth daily.  [name] telmisartan (MICARDIS) 40 MG tablet Take 1 tablet by mouth once daily  . [DISCONTINUED] FLUZONE HIGH-DOSE QUADRIVALENT 0.7 ML SUSY  (Patient not taking: Reported on 01/02/2021)   No facility-administered encounter medications on file as of 01/02/2021.     Objective:    PHYSICAL EXAMINATION:    VITALS:   Vitals:   01/02/21 1044  BP: (!) 128/56  Pulse: (!) 57  SpO2: 95%  Weight: 175 lb (79.4 kg)  Height: 5\' 10"  (1.778 m)    GEN:  The patient appears stated age and is in NAD. HEENT:  Normocephalic, atraumatic.  The mucous membranes are moist.   Neurological examination:  Orientation: The patient is alert and oriented x3.  However, he can become repetitive (tells me about his sister coming to visit and then later in the visit when I mentioned it, he asked me how I knew about it) Cranial nerves: There is good facial symmetry. The speech is fluent and clear. Soft palate rises symmetrically and there is no tongue deviation. Hearing is intact to conversational tone. Sensation: Sensation is intact to light touch throughout Motor: Strength is at least antigravity x4.  Movement examination: Tone: There is normal tone in the UE/LE Abnormal movements: head tremor in the "yes" direction. There is right greater than left upper extremity  postural tremor. There is some mild rest tremor in the right hand. Coordination:  There is no decremation with RAM's,  Gait and Station: The patient easily rises out of the chair. He has right foot drop (chronic) still has a marching type of gait on the right.  I have reviewed and interpreted the following labs independently   Chemistry      Component Value Date/Time   NA 138 11/27/2020 1220   NA 140 04/01/2017 1033   K 4.4 11/27/2020 1220   CL 103 11/27/2020 1220   CO2 31 11/27/2020 1220   BUN 22 11/27/2020 1220   BUN 24 04/01/2017 1033   CREATININE 1.13 11/27/2020 1220      Component Value Date/Time   CALCIUM 9.3 11/27/2020 1220   ALKPHOS 53 11/27/2020 1220   AST 21 11/27/2020 1220   ALT 17 11/27/2020 1220   BILITOT 1.2 11/27/2020 1220     Lab Results  Component Value Date   TSH 3.49 11/27/2020   Lab Results  Component Value Date   VITAMINB12 367 11/27/2020      Cc:  [name], MD

## 2021-01-02 ENCOUNTER — Other Ambulatory Visit: Payer: Self-pay

## 2021-01-02 ENCOUNTER — Encounter: Payer: Self-pay | Admitting: Neurology

## 2021-01-02 ENCOUNTER — Ambulatory Visit: Payer: Medicare Other | Admitting: Neurology

## 2021-01-02 VITALS — BP 128/56 | HR 57 | Ht 70.0 in | Wt 175.0 lb

## 2021-01-02 DIAGNOSIS — G250 Essential tremor: Secondary | ICD-10-CM

## 2021-01-02 DIAGNOSIS — R413 Other amnesia: Secondary | ICD-10-CM | POA: Diagnosis not present

## 2021-01-02 MED ORDER — PROPRANOLOL HCL ER 60 MG PO CP24: 60.0000 mg | ORAL_CAPSULE | Freq: Every day | ORAL | 3 refills | Status: DC

## 2021-01-02 NOTE — Patient Instructions (Signed)
Change donepezil to the AM to help with the dreams.  The physicians and staff at [facility] are committed to providing excellent care. You may receive a survey requesting feedback about your experience at our office. We strive to receive "very good" responses to the survey questions. If you feel that your experience would prevent you from giving the office a "very good " response, please contact our office to try to remedy the situation. We may be reached at [phone]. Thank you for taking the time out of your busy day to complete the survey.

## 2021-01-30 ENCOUNTER — Other Ambulatory Visit: Payer: Self-pay | Admitting: Internal Medicine

## 2021-04-30 ENCOUNTER — Other Ambulatory Visit: Payer: Self-pay | Admitting: Internal Medicine

## 2021-04-30 NOTE — Telephone Encounter (Signed)
Please refill as per office routine med refill policy (all routine meds refilled for 3 mo or monthly per pt preference up to one year from last visit, then month to month grace period for 3 mo, then further med refills will have to be denied)  

## 2021-05-02 ENCOUNTER — Other Ambulatory Visit: Payer: Self-pay | Admitting: Internal Medicine

## 2021-05-02 NOTE — Telephone Encounter (Signed)
Please refill as per office routine med refill policy (all routine meds refilled for 3 mo or monthly per pt preference up to one year from last visit, then month to month grace period for 3 mo, then further med refills will have to be denied)  

## 2021-05-29 ENCOUNTER — Encounter: Payer: Self-pay | Admitting: Internal Medicine

## 2021-05-29 ENCOUNTER — Other Ambulatory Visit: Payer: Self-pay

## 2021-05-29 ENCOUNTER — Ambulatory Visit (INDEPENDENT_AMBULATORY_CARE_PROVIDER_SITE_OTHER): Payer: Medicare Other | Admitting: Internal Medicine

## 2021-05-29 VITALS — BP 150/66 | HR 54 | Temp 98.2°F | Ht 70.0 in | Wt 170.8 lb

## 2021-05-29 DIAGNOSIS — G250 Essential tremor: Secondary | ICD-10-CM | POA: Diagnosis not present

## 2021-05-29 DIAGNOSIS — I10 Essential (primary) hypertension: Secondary | ICD-10-CM | POA: Diagnosis not present

## 2021-05-29 DIAGNOSIS — R7302 Impaired glucose tolerance (oral): Secondary | ICD-10-CM | POA: Diagnosis not present

## 2021-05-29 DIAGNOSIS — Z23 Encounter for immunization: Secondary | ICD-10-CM | POA: Diagnosis not present

## 2021-05-29 DIAGNOSIS — F0390 Unspecified dementia without behavioral disturbance: Secondary | ICD-10-CM | POA: Diagnosis not present

## 2021-05-29 DIAGNOSIS — E559 Vitamin D deficiency, unspecified: Secondary | ICD-10-CM | POA: Diagnosis not present

## 2021-05-29 DIAGNOSIS — E538 Deficiency of other specified B group vitamins: Secondary | ICD-10-CM

## 2021-05-29 DIAGNOSIS — E785 Hyperlipidemia, unspecified: Secondary | ICD-10-CM | POA: Diagnosis not present

## 2021-05-29 LAB — HEMOGLOBIN A1C: Hgb A1c MFr Bld: 6.2 % (ref 4.6–6.5)

## 2021-05-29 LAB — BASIC METABOLIC PANEL
BUN: 19 mg/dL (ref 6–23)
CO2: 29 mEq/L (ref 19–32)
Calcium: 9.5 mg/dL (ref 8.4–10.5)
Chloride: 100 mEq/L (ref 96–112)
Creatinine, Ser: 1.25 mg/dL (ref 0.40–1.50)
GFR: 51.02 mL/min — ABNORMAL LOW (ref >60.00)
Glucose, Bld: 107 mg/dL — ABNORMAL HIGH (ref 70–99)
Potassium: 4.6 mEq/L (ref 3.5–5.1)
Sodium: 136 mEq/L (ref 135–145)

## 2021-05-29 LAB — LIPID PANEL
Cholesterol: 136 mg/dL (ref 0–200)
HDL: 64.8 mg/dL (ref >39.00)
LDL Cholesterol: 60 mg/dL (ref 0–99)
NonHDL: 71.55
Total CHOL/HDL Ratio: 2
Triglycerides: 59 mg/dL (ref 0.0–149.0)
VLDL: 11.8 mg/dL (ref 0.0–40.0)

## 2021-05-29 LAB — HEPATIC FUNCTION PANEL
ALT: 22 U/L (ref 0–53)
AST: 26 U/L (ref 0–37)
Albumin: 4 g/dL (ref 3.5–5.2)
Alkaline Phosphatase: 51 U/L (ref 39–117)
Bilirubin, Direct: 0.3 mg/dL (ref 0.0–0.3)
Total Bilirubin: 1.4 mg/dL — ABNORMAL HIGH (ref 0.2–1.2)
Total Protein: 6.7 g/dL (ref 6.0–8.3)

## 2021-05-29 LAB — VITAMIN D 25 HYDROXY (VIT D DEFICIENCY, FRACTURES): VITD: 43.28 ng/mL (ref 30.00–100.00)

## 2021-05-29 LAB — VITAMIN B12: Vitamin B-12: 326 pg/mL (ref 211–911)

## 2021-05-29 NOTE — Assessment & Plan Note (Signed)
BP Readings from Last 3 Encounters:  05/29/21 (!) 150/66  01/02/21 (!) 128/56  11/27/20 124/66   Uncontrolled here, pt states better controlled at home, pt to continue medical treatment inderal and micardis, and f/u BP at home and next visit

## 2021-05-29 NOTE — Assessment & Plan Note (Signed)
Pt to stop the aricept due to crazy dreams, and call in 2 wks if resolved to consider start namenda

## 2021-05-29 NOTE — Assessment & Plan Note (Signed)
stabel per neurology, will continue the propranolol

## 2021-05-29 NOTE — Patient Instructions (Signed)
We can refill the propranolol as you may need in future  Ok to STOP the donepazil  Please call in 2 wks for start namenda if the crazy dreams are gond  Please continue all other medications as before, and refills have been done if requested.  Please have the pharmacy call with any other refills you may need.  Please continue your efforts at being more active, low cholesterol diet, and weight control.  Please keep your appointments with your specialists as you may have planned  Please go to the LAB at the blood drawing area for the tests to be done  You will be contacted by phone if any changes need to be made immediately.  Otherwise, you will receive a letter about your results with an explanation, but please check with MyChart first.  Please remember to sign up for MyChart if you have not done so, as this will be important to you in the future with finding out test results, communicating by private email, and scheduling acute appointments online when needed.  Please make an Appointment to return in 6 months, or sooner if needed

## 2021-05-29 NOTE — Assessment & Plan Note (Signed)
Lab Results  Component Value Date   HGBA1C 6.2 05/29/2021   Stable, pt to continue current medical treatment  - diet

## 2021-05-29 NOTE — Assessment & Plan Note (Signed)
Lab Results  Component Value Date   LDLCALC 60 05/29/2021   Stable, pt to continue current statin - lipitor

## 2021-05-29 NOTE — Progress Notes (Signed)
Patient ID: Ronald [name], male   DOB: 1930-07-18, 87 y.o.   MRN: [id number]        Chief Complaint: follow up HTN, ckd, dementia       HPI:  Ronald [name] is a 87 y.o. male here overall doing well today but admits to crazy dreams most nights with taking the aricept even with taking in the am per neurology; Pt denies chest pain, increased sob or doe, wheezing, orthopnea, PND, increased LE swelling, palpitations, dizziness or syncope.  In fact, Lasix per cardiology has improved leg swelling.  Bp has been < 140/90 at home, but not check recently.   Pt denies polydipsia, polyuria, or new focal neuro s/s.   Pt denies fever, night sweats, loss of appetite, or other constitutional symptoms,  though has lost almost 10 lbs since march of which he was unaware.  Wt Readings from Last 3 Encounters:  05/29/21 170 lb 12.8 oz (77.5 kg)  01/02/21 175 lb (79.4 kg)  11/27/20 179 lb (81.2 kg)   BP Readings from Last 3 Encounters:  05/29/21 (!) 150/66  01/02/21 (!) 128/56  11/27/20 124/66         Past Medical History:  Diagnosis Date   ALLERGIC RHINITIS 11/25/2007   ANXIETY 11/25/2007   COLONIC POLYPS, HX OF 11/25/2007   CORONARY ARTERY DISEASE 11/25/2007   Cough 01/04/2009   HEMORRHOIDS, INTERNAL 11/06/2010   HOARSENESS 01/04/2009   HYPERLIPIDEMIA 11/25/2007   HYPERTENSION 11/25/2007   Impaired glucose tolerance 04/21/2013   LOW BACK PAIN 11/25/2007   Lumbar radiculopathy, chronic    with right foot drop   PEPTIC ULCER DISEASE 11/25/2007   Peroneal nerve lesion of lower extremity, 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 He has never used smokeless tobacco. He reports that he  does not currently use alcohol. He reports that he does not use drugs. family history includes Colon cancer in his father. Allergies  Allergen Reactions   Aricept [Donepezil] Other (See Comments)    Crazy dreams   Current Outpatient Medications on File Prior to Visit  Medication Sig Dispense Refill   aspirin 81 MG tablet Take 81 mg by mouth daily.     atorvastatin (LIPITOR) 40 MG tablet TAKE 1 TABLET BY MOUTH ONCE DAILY IN THE EVENING 90 tablet 0   b complex vitamins tablet Take 1 tablet by mouth daily.     Cholecalciferol (VITAMIN D) 2000 UNITS CAPS Take 2,000 Units by mouth daily.     clotrimazole-betamethasone (LOTRISONE) cream Use as directed twice per day as needed 45 g 1   Coenzyme Q10 (COQ10 PO) Take 1 tablet by mouth daily.      donepezil (ARICEPT) 5 MG tablet Take 1 tablet (5 mg total) by mouth at bedtime. 90 tablet 3   furosemide (LASIX) 20 MG tablet Take 1 tablet by mouth once daily 90 tablet 3   OMEGA 3 1200 MG CAPS Take 1 capsule by mouth 2 (two) times daily.      propranolol ER (INDERAL LA) 60 MG 24 hr capsule Take 1 capsule (60 mg  total) by mouth daily. 90 capsule 3   telmisartan (MICARDIS) 40 MG tablet Take 1 tablet by mouth once daily 90 tablet 0   No current facility-administered medications on file prior to visit.        ROS:  All others reviewed and negative.  Objective        PE:  BP (!) 150/66 (BP Location: Left Arm, Patient Position: Sitting, Cuff Size: Normal)   Pulse (!) 54   Temp 98.2 F (36.8 C) (Oral)   Ht 5\' 10"  (1.778 m)   Wt 170 lb 12.8 oz (77.5 kg)   SpO2 96%   BMI 24.51 kg/m                 Constitutional: Pt appears in NAD               HENT: Head: NCAT.                Right Ear: External ear normal.                 Left Ear: External ear normal.                Eyes: . Pupils are equal, round, and reactive to light. Conjunctivae and EOM are normal               Nose: without d/c or deformity               Neck: Neck supple. Gross normal  ROM               Cardiovascular: Normal rate and regular rhythm.                 Pulmonary/Chest: Effort normal and breath sounds without rales or wheezing.                Abd:  Soft, NT, ND, + BS, no organomegaly               Neurological: Pt is alert. At baseline orientation, motor grossly intact               Skin: Skin is warm. No rashes, no other new lesions, LE edema - trace bilateral               Psychiatric: Pt behavior is normal without agitation   Micro: none  Cardiac tracings I have personally interpreted today:  none  Pertinent Radiological findings (summarize): none   Lab Results  Component Value Date   WBC 4.5 11/27/2020   HGB 12.3 (L) 11/27/2020   HCT 35.9 (L) 11/27/2020   PLT 211.0 11/27/2020   GLUCOSE 107 (H) 05/29/2021   CHOL 136 05/29/2021   TRIG 59.0 05/29/2021   HDL 64.80 05/29/2021   LDLCALC 60 05/29/2021   ALT 22 05/29/2021   AST 26 05/29/2021   NA 136 05/29/2021   K 4.6 05/29/2021   CL 100 05/29/2021   CREATININE 1.25 05/29/2021   BUN 19 05/29/2021   CO2 29 05/29/2021   TSH 3.49 11/27/2020   PSA 2.00 01/25/2010   HGBA1C 6.2 05/29/2021   Assessment/Plan:  Ronald [name] is a 87 y.o. White or Caucasian [1] male with  has a past medical history of ALLERGIC RHINITIS (11/25/2007), ANXIETY (11/25/2007), COLONIC POLYPS, HX OF (11/25/2007), CORONARY ARTERY DISEASE (11/25/2007), Cough (01/04/2009), HEMORRHOIDS, INTERNAL (11/06/2010), HOARSENESS (01/04/2009), HYPERLIPIDEMIA (11/25/2007), HYPERTENSION (11/25/2007), Impaired glucose tolerance (04/21/2013), LOW BACK PAIN (11/25/2007), Lumbar radiculopathy, chronic, PEPTIC ULCER DISEASE (11/25/2007), Peroneal  nerve lesion of lower extremity, bilateral (05/08/2017), Preventative health care (04/07/2011), PSA, INCREASED (01/05/2009), Right foot drop, Spasmodic dysphonia, and TREMOR, ESSENTIAL (11/25/2007).  Essential tremor stabel per neurology, will continue the propranolol  Impaired glucose tolerance Lab Results  Component  Value Date   HGBA1C 6.2 05/29/2021   Stable, pt to continue current medical treatment  - diet   Essential hypertension BP Readings from Last 3 Encounters:  05/29/21 (!) 150/66  01/02/21 (!) 128/56  11/27/20 124/66   Uncontrolled here, pt states better controlled at home, pt to continue medical treatment inderal and micardis, and f/u BP at home and next visit   Elevated lipids Lab Results  Component Value Date   LDLCALC 60 05/29/2021   Stable, pt to continue current statin - lipitor   Dementia (HCC) Pt to stop the aricept due to crazy dreams, and call in 2 wks if resolved to consider start namenda  Followup: No follow-ups on file.  Oliver Barre, MD 05/29/2021 9:58 PM [facility]

## 2021-07-06 ENCOUNTER — Telehealth: Payer: Self-pay | Admitting: Internal Medicine

## 2021-07-06 NOTE — Telephone Encounter (Signed)
Left message for patient to call me back at [phone] to schedule Medicare Annual Wellness Visit   Last AWV  11/12/18  Please schedule at anytime with [facility] Advisor if patient calls the office back.    40 Minutes appointment   Any questions, please call me at [phone]

## 2021-07-30 ENCOUNTER — Other Ambulatory Visit: Payer: Self-pay | Admitting: Internal Medicine

## 2021-07-30 NOTE — Telephone Encounter (Signed)
Please refill as per office routine med refill policy (all routine meds to be refilled for 3 mo or monthly (per pt preference) up to one year from last visit, then month to month grace period for 3 mo, then further med refills will have to be denied) ? ?

## 2021-09-24 ENCOUNTER — Other Ambulatory Visit: Payer: Self-pay | Admitting: Cardiovascular Disease

## 2021-10-22 ENCOUNTER — Ambulatory Visit: Payer: Medicare Other | Admitting: Cardiovascular Disease

## 2021-11-19 ENCOUNTER — Ambulatory Visit: Payer: Medicare Other | Admitting: Internal Medicine

## 2021-11-27 ENCOUNTER — Encounter: Payer: Self-pay | Admitting: Internal Medicine

## 2021-11-27 ENCOUNTER — Other Ambulatory Visit: Payer: Self-pay

## 2021-11-27 ENCOUNTER — Ambulatory Visit: Payer: Medicare Other | Admitting: Sports Medicine

## 2021-11-27 ENCOUNTER — Ambulatory Visit (INDEPENDENT_AMBULATORY_CARE_PROVIDER_SITE_OTHER): Payer: Medicare Other

## 2021-11-27 ENCOUNTER — Ambulatory Visit (INDEPENDENT_AMBULATORY_CARE_PROVIDER_SITE_OTHER): Payer: Medicare Other | Admitting: Internal Medicine

## 2021-11-27 VITALS — BP 118/58 | HR 62 | Ht 70.0 in | Wt 174.0 lb

## 2021-11-27 VITALS — BP 118/58 | HR 62 | Temp 98.2°F | Ht 70.0 in | Wt 174.0 lb

## 2021-11-27 DIAGNOSIS — I10 Essential (primary) hypertension: Secondary | ICD-10-CM

## 2021-11-27 DIAGNOSIS — E538 Deficiency of other specified B group vitamins: Secondary | ICD-10-CM | POA: Diagnosis not present

## 2021-11-27 DIAGNOSIS — E559 Vitamin D deficiency, unspecified: Secondary | ICD-10-CM

## 2021-11-27 DIAGNOSIS — M79641 Pain in right hand: Secondary | ICD-10-CM

## 2021-11-27 DIAGNOSIS — Z0001 Encounter for general adult medical examination with abnormal findings: Secondary | ICD-10-CM

## 2021-11-27 DIAGNOSIS — M65331 Trigger finger, right middle finger: Secondary | ICD-10-CM | POA: Diagnosis not present

## 2021-11-27 DIAGNOSIS — F03A4 Unspecified dementia, mild, with anxiety: Secondary | ICD-10-CM | POA: Diagnosis not present

## 2021-11-27 DIAGNOSIS — M6530 Trigger finger, unspecified finger: Secondary | ICD-10-CM | POA: Insufficient documentation

## 2021-11-27 DIAGNOSIS — R7302 Impaired glucose tolerance (oral): Secondary | ICD-10-CM | POA: Diagnosis not present

## 2021-11-27 LAB — LIPID PANEL
Cholesterol: 135 mg/dL (ref 0–200)
HDL: 60.6 mg/dL (ref >39.00)
LDL Cholesterol: 54 mg/dL (ref 0–99)
NonHDL: 74.19
Total CHOL/HDL Ratio: 2
Triglycerides: 103 mg/dL (ref 0.0–149.0)
VLDL: 20.6 mg/dL (ref 0.0–40.0)

## 2021-11-27 LAB — CBC WITH DIFFERENTIAL/PLATELET
Basophils Absolute: 0 10*3/uL (ref 0.0–0.1)
Basophils Relative: 0.4 % (ref 0.0–3.0)
Eosinophils Absolute: 0.2 10*3/uL (ref 0.0–0.7)
Eosinophils Relative: 5.1 % — ABNORMAL HIGH (ref 0.0–5.0)
HCT: 33.4 % — ABNORMAL LOW (ref 39.0–52.0)
Hemoglobin: 11.6 g/dL — ABNORMAL LOW (ref 13.0–17.0)
Lymphocytes Relative: 22.4 % (ref 12.0–46.0)
Lymphs Abs: 0.9 10*3/uL (ref 0.7–4.0)
MCHC: 34.6 g/dL (ref 30.0–36.0)
MCV: 110.7 fl — ABNORMAL HIGH (ref 78.0–100.0)
Monocytes Absolute: 0.6 10*3/uL (ref 0.1–1.0)
Monocytes Relative: 15.5 % — ABNORMAL HIGH (ref 3.0–12.0)
Neutro Abs: 2.2 10*3/uL (ref 1.4–7.7)
Neutrophils Relative %: 56.6 % (ref 43.0–77.0)
Platelets: 223 10*3/uL (ref 150.0–400.0)
RBC: 3.02 Mil/uL — ABNORMAL LOW (ref 4.22–5.81)
RDW: 14.2 % (ref 11.5–15.5)
WBC: 3.8 10*3/uL — ABNORMAL LOW (ref 4.0–10.5)

## 2021-11-27 LAB — HEPATIC FUNCTION PANEL
ALT: 20 U/L (ref 0–53)
AST: 24 U/L (ref 0–37)
Albumin: 4.1 g/dL (ref 3.5–5.2)
Alkaline Phosphatase: 62 U/L (ref 39–117)
Bilirubin, Direct: 0.2 mg/dL (ref 0.0–0.3)
Total Bilirubin: 1.2 mg/dL (ref 0.2–1.2)
Total Protein: 6.3 g/dL (ref 6.0–8.3)

## 2021-11-27 LAB — VITAMIN D 25 HYDROXY (VIT D DEFICIENCY, FRACTURES): VITD: 47.72 ng/mL (ref 30.00–100.00)

## 2021-11-27 LAB — URINALYSIS, ROUTINE W REFLEX MICROSCOPIC
Bilirubin Urine: NEGATIVE
Hgb urine dipstick: NEGATIVE
Ketones, ur: NEGATIVE
Leukocytes,Ua: NEGATIVE
Nitrite: NEGATIVE
RBC / HPF: NONE SEEN (ref 0–?)
Specific Gravity, Urine: 1.015 (ref 1.000–1.030)
Total Protein, Urine: NEGATIVE
Urine Glucose: NEGATIVE
Urobilinogen, UA: 0.2 (ref 0.0–1.0)
pH: 5.5 (ref 5.0–8.0)

## 2021-11-27 LAB — BASIC METABOLIC PANEL
BUN: 23 mg/dL (ref 6–23)
CO2: 29 mEq/L (ref 19–32)
Calcium: 9.3 mg/dL (ref 8.4–10.5)
Chloride: 103 mEq/L (ref 96–112)
Creatinine, Ser: 1.22 mg/dL (ref 0.40–1.50)
GFR: 52.34 mL/min — ABNORMAL LOW (ref >60.00)
Glucose, Bld: 111 mg/dL — ABNORMAL HIGH (ref 70–99)
Potassium: 4.5 mEq/L (ref 3.5–5.1)
Sodium: 138 mEq/L (ref 135–145)

## 2021-11-27 LAB — HEMOGLOBIN A1C: Hgb A1c MFr Bld: 6.3 % (ref 4.6–6.5)

## 2021-11-27 LAB — TSH: TSH: 2.62 u[IU]/mL (ref 0.35–5.50)

## 2021-11-27 LAB — VITAMIN B12: Vitamin B-12: 341 pg/mL (ref 211–911)

## 2021-11-27 MED ORDER — DONEPEZIL HCL 5 MG PO TABS: 5.0000 mg | ORAL_TABLET | Freq: Every day | ORAL | 3 refills | Status: DC

## 2021-11-27 MED ORDER — TELMISARTAN 40 MG PO TABS: 40.0000 mg | ORAL_TABLET | Freq: Every day | ORAL | 3 refills | Status: DC

## 2021-11-27 MED ORDER — PROPRANOLOL HCL ER 60 MG PO CP24: 60.0000 mg | ORAL_CAPSULE | Freq: Every day | ORAL | 3 refills | Status: DC

## 2021-11-27 MED ORDER — ATORVASTATIN CALCIUM 40 MG PO TABS: 40.0000 mg | ORAL_TABLET | Freq: Every evening | ORAL | 3 refills | Status: DC

## 2021-11-27 NOTE — Progress Notes (Signed)
Patient ID: Ronald [name], male   DOB: 1930-06-21, 88 y.o.   MRN: [id number] ? ? ? ?     Chief Complaint:: wellness exam and right trigger finger, hyeprglycemia, htn, dementia ? ?     HPI:  Ronald [name] is a 88 y.o. male here for wellness exam; decliens shingrix, o/w up to date.  Here with family ?         ?              Also has mild worsening right middle finger trigger finger with right palmar pain and contracture, but on discussion does not want referral for even cortisone for now.  Pt denies chest pain, increased sob or doe, wheezing, orthopnea, PND, increased LE swelling, palpitations, dizziness or syncope.   Pt denies polydipsia, polyuria, or new focal neuro s/s.    Pt denies fever, wt loss, night sweats, loss of appetite, or other constitutional symptoms  Dementia overall stable symptomatically, and not assoc with behavioral changes such as hallucinations, paranoia, or agitation. ?  ?Wt Readings from Last 3 Encounters:  ?11/27/21 174 lb (78.9 kg)  ?11/27/21 174 lb (78.9 kg)  ?05/29/21 170 lb 12.8 oz (77.5 kg)  ? ?BP Readings from Last 3 Encounters:  ?11/27/21 (!) 118/58  ?11/27/21 (!) 118/58  ?05/29/21 (!) 150/66  ? ?Immunization History  ?Administered Date(s) Administered  ? Fluad Quad(high Dose 65+) 05/14/2019, 05/29/2021  ? H1N1 06/16/2008  ? Influenza Split 05/28/2013  ? Influenza, High Dose Seasonal PF 06/23/2017, 06/16/2018, 08/05/2018  ? Influenza,inj,Quad PF,6+ Mos 06/21/2014, 05/07/2016  ? Influenza-Unspecified 06/15/2015  ? Moderna Sars-Covid-2 Vaccination 11/09/2019, 11/30/2019  ? PFIZER(Purple Top)SARS-COV-2 Vaccination 06/10/2020, 02/28/2021, 07/19/2021  ? Pneumococcal Conjugate-13 04/28/2014  ? Pneumococcal Polysaccharide-23 09/16/2004, 04/16/2011  ? Td 01/04/2009  ? Tdap 05/14/2019  ? Zoster, Live 08/17/2013  ? ?There are no preventive care reminders to display for this patient. ? ?  ? ?Past Medical History:  ?Diagnosis Date  ? ALLERGIC RHINITIS 11/25/2007  ? ANXIETY 11/25/2007  ? COLONIC POLYPS,  HX OF 11/25/2007  ? CORONARY ARTERY DISEASE 11/25/2007  ? Cough 01/04/2009  ? HEMORRHOIDS, INTERNAL 11/06/2010  ? HOARSENESS 01/04/2009  ? HYPERLIPIDEMIA 11/25/2007  ? HYPERTENSION 11/25/2007  ? Impaired glucose tolerance 04/21/2013  ? LOW BACK PAIN 11/25/2007  ? Lumbar radiculopathy, chronic   ? with right foot drop  ? PEPTIC ULCER DISEASE 11/25/2007  ? 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 Preventative health care 04/07/2011  ? PSA, INCREASED 01/05/2009  ? Right foot drop   ? Spasmodic dysphonia   ? TREMOR, ESSENTIAL 11/25/2007  ? ?Past Surgical History:  ?Procedure Laterality Date  ? APPENDECTOMY    ? BACK SURGERY    ? CORONARY ARTERY BYPASS GRAFT    ? s/p lumbar laminectomy and fusion    ? TONSILLECTOMY    ? ? reports that he has never smoked. He has never used smokeless tobacco. He reports that he does not currently use alcohol. He reports that he does not use drugs. ?family history includes Colon cancer in his father. ?Allergies  ?Allergen Reactions  ? Aricept [Donepezil] Other (See Comments)  ?  Crazy dreams  ? ?Current Outpatient Medications on File Prior to Visit  ?Medication Sig Dispense Refill  ? aspirin 81 MG tablet Take 81 mg by mouth daily.    ? b complex vitamins tablet Take 1 tablet by mouth daily.    ? Cholecalciferol (VITAMIN D) 2000 UNITS CAPS Take 2,000 Units by mouth  daily.    ? clotrimazole-betamethasone (LOTRISONE) cream Use as directed twice per day as needed 45 g 1  ? Coenzyme Q10 (COQ10 PO) Take 1 tablet by mouth daily.     ? furosemide (LASIX) 20 MG tablet Take 1 tablet by mouth once daily 90 tablet 0  ? OMEGA 3 1200 MG CAPS Take 1 capsule by mouth 2 (two) times daily.     ? ?No current facility-administered medications on file prior to visit.  ? ?     ROS:  All others reviewed and negative. ? ?Objective  ? ?     PE:  BP (!) 118/58 (BP Location: Left Arm, Patient Position: Sitting, Cuff Size: Normal)   Pulse 62   Temp 98.2 ?F (36.8 ?C) (Oral)   Ht 5\' 10"  (1.778 m)   Wt 174  lb (78.9 kg)   SpO2 96%   BMI 24.97 kg/m?  ? ?              Constitutional: Pt appears in NAD ?              HENT: Head: NCAT.  ?              Right Ear: External ear normal.   ?              Left Ear: External ear normal.  ?              Eyes: . Pupils are equal, round, and reactive to light. Conjunctivae and EOM are normal ?              Nose: without d/c or deformity ?              Neck: Neck supple. Gross normal ROM ?              Cardiovascular: Normal rate and regular rhythm.   ?              Pulmonary/Chest: Effort normal and breath sounds without rales or wheezing.  ?              Abd:  Soft, NT, ND, + BS, no organomegaly ?              Neurological: Pt is alert. At baseline orientation with apparent ST memory loss, motor grossly intact ?              Skin: Skin is warm. No rashes, no other new lesions, LE edema - none; right middle finger duptryns like contracture mild to mod ?              Psychiatric: Pt behavior is normal without agitation  ? ?Micro: none ? ?Cardiac tracings I have personally interpreted today:  none ? ?Pertinent Radiological findings (summarize): none  ? ?Lab Results  ?Component Value Date  ? WBC 3.8 (L) 11/27/2021  ? HGB 11.6 (L) 11/27/2021  ? HCT 33.4 (L) 11/27/2021  ? PLT 223.0 11/27/2021  ? GLUCOSE 111 (H) 11/27/2021  ? CHOL 135 11/27/2021  ? TRIG 103.0 11/27/2021  ? HDL 60.60 11/27/2021  ? LDLCALC 54 11/27/2021  ? ALT 20 11/27/2021  ? AST 24 11/27/2021  ? NA 138 11/27/2021  ? K 4.5 11/27/2021  ? CL 103 11/27/2021  ? CREATININE 1.22 11/27/2021  ? BUN 23 11/27/2021  ? CO2 29 11/27/2021  ? TSH 2.62 11/27/2021  ? PSA 2.00 01/25/2010  ? HGBA1C 6.3 11/27/2021  ? ?Assessment/Plan:  ?Ronald [name] is  a 88 y.o. White or Caucasian [1] male with  has a past medical history of ALLERGIC RHINITIS (11/25/2007), ANXIETY (11/25/2007), COLONIC POLYPS, HX OF (11/25/2007), CORONARY ARTERY DISEASE (11/25/2007), Cough (01/04/2009), HEMORRHOIDS, INTERNAL (11/06/2010), HOARSENESS (01/04/2009), HYPERLIPIDEMIA  (11/25/2007), HYPERTENSION (11/25/2007), Impaired glucose tolerance (04/21/2013), LOW BACK PAIN (11/25/2007), Lumbar radiculopathy, chronic, PEPTIC ULCER DISEASE (11/25/2007), Peroneal nerve lesion of lower extremity, bilateral (05/08/2017), Preventative health care (04/07/2011), PSA, INCREASED (01/05/2009), Right foot drop, Spasmodic dysphonia, and TREMOR, ESSENTIAL (11/25/2007). ? ?Encounter for well adult exam with abnormal findings ?Age and sex appropriate education and counseling updated with regular exercise and diet ?Referrals for preventative services - none needed ?Immunizations addressed - declines shingrix ?Smoking counseling  - none needed ?Evidence for depression or other mood disorder - none significant ?Most recent labs reviewed. ?I have personally reviewed and have noted: ?1) the patient's medical and social history ?2) The patient's current medications and supplements ?3) The patient's height, weight, and BMI have been recorded in the chart ? ? ?Dementia (HCC) ?Stable overall,  to f/u any worsening symptoms or concerns ? ?Right trigger finger ?With mild worsening and pain, declines hand surgury referral for now but may call later, o/w ok for sport med referral ? ?Impaired glucose tolerance ?Lab Results  ?Component Value Date  ? HGBA1C 6.3 11/27/2021  ? ?Stable, pt to continue current medical treatment  - diet ? ? ?Essential hypertension ?BP Readings from Last 3 Encounters:  ?11/27/21 (!) 118/58  ?11/27/21 (!) 118/58  ?05/29/21 (!) 150/66  ? ?Stable, pt to continue medical treatment inderal, micardis ? ?Followup: Return in about 6 months (around 05/30/2022). ? ?Oliver BarreJames Felton, MD 11/30/2021 10:10 PM ?[facility] ?[facility] ?Internal Medicine ?

## 2021-11-27 NOTE — Progress Notes (Signed)
? ? Aleen Sells D.Judd Gaudier ?[facility] ?[address] [place] [address] ?Phone: [phone] ?  ?Assessment and Plan:   ?  ?1. Right hand pain ?2. Acquired trigger finger of right middle finger ?-Acute, uncomplicated, initial sports medicine visit ?- Right third digit trigger finger based on HPI, physical exam ?- Patient elected for CSI.  Tolerated well per note below ?- X-ray obtained in clinic.  My interpretation: No acute fracture or dislocation.  Cortical changes most prominent at the [facility], first MCP, first IP, second and third PIP ?- May use Tylenol as needed for day-to-day pain relief ?  ?Procedure: Flexor Tendon Sheath Injection (Trigger Finger) ?Side: Right 3rd digit ?Right ?Indication: @DIAGXWITHICD @ ? ?After explaining the procedure, viable alternatives, risks, and answering any questions, consent was given verbally.  The site was cleaned with alcohol prep.  A steroid injection was performed under ultrasound guidance using 0.66ml of 1% lidocaine without epinephrine and 20 mg of Kenalog 40 with sterile technique.  This was well tolerated and resulted in  relief.  Needle was removed and dressing placed and post injection instructions were given including  a discussion of likely return of pain today after the anesthetic wears off (with the possibility of worsened pain) until the steroid starts to work in 3-5 days.   Pt was advised to call or return to clinic if these symptoms worsen or fail to improve as anticipated.  If not at least 50% better in 6 weeks would consider repeat injection.  ? ?Pertinent previous records reviewed include none ?  ?Follow Up: 2 weeks for reevaluation.  If no improvement or worsening of symptoms could consider ultrasound-guided trigger finger injection ?  ?Subjective:   ?I, 4m, am serving as a Jerene Canny for Doctor Neurosurgeon ? ?Chief Complaint: trigger finger right  ? ?HPI:  ? ?11/27/21 ?Patient is a 88 year old male complaining of  trigger finger of the right hand. Patient states third digit will be able to use it during the day and at night it collapses and locks and hurts badly he has to pull it and it hurts badly it feels like his finger is breaking been going on for about 2 weeks , after it happens when he rubs his palm he can feel the pain in his joint  ? ? ?Relevant Historical Information: Hypertension, PUD ? ?Additional pertinent review of systems negative. ? ? ?Current Outpatient Medications:  ?  aspirin 81 MG tablet, Take 81 mg by mouth daily., Disp: , Rfl:  ?  atorvastatin (LIPITOR) 40 MG tablet, Take 1 tablet (40 mg total) by mouth every evening., Disp: 90 tablet, Rfl: 3 ?  b complex vitamins tablet, Take 1 tablet by mouth daily., Disp: , Rfl:  ?  Cholecalciferol (VITAMIN D) 2000 UNITS CAPS, Take 2,000 Units by mouth daily., Disp: , Rfl:  ?  clotrimazole-betamethasone (LOTRISONE) cream, Use as directed twice per day as needed, Disp: 45 g, Rfl: 1 ?  Coenzyme Q10 (COQ10 PO), Take 1 tablet by mouth daily. , Disp: , Rfl:  ?  donepezil (ARICEPT) 5 MG tablet, Take 1 tablet (5 mg total) by mouth at bedtime., Disp: 90 tablet, Rfl: 3 ?  furosemide (LASIX) 20 MG tablet, Take 1 tablet by mouth once daily, Disp: 90 tablet, Rfl: 0 ?  OMEGA 3 1200 MG CAPS, Take 1 capsule by mouth 2 (two) times daily. , Disp: , Rfl:  ?  propranolol ER (INDERAL LA) 60 MG 24 hr capsule, Take 1  capsule (60 mg total) by mouth daily., Disp: 90 capsule, Rfl: 3 ?  telmisartan (MICARDIS) 40 MG tablet, Take 1 tablet (40 mg total) by mouth daily., Disp: 90 tablet, Rfl: 3  ? ?Objective:   ?  ?Vitals:  ? 11/27/21 1428  ?BP: (!) 118/58  ?Pulse: 62  ?SpO2: 96%  ?Weight: 174 lb (78.9 kg)  ?Height: 5\' 10"  (1.778 m)  ?  ?  ?Body mass index is 24.97 kg/m?.  ?  ?Physical Exam:   ? ?General: Appears well, nad, nontoxic and pleasant ?Neuro:sensation intact, strength is 5/5 with df/pf/inv/ev, muscle tone wnl ?Skin:no susupicious lesions or rashes ? ?Right hand/wrist:  No deformity or  swelling appreciated. ?Triggering of third digit with palpable nodule just proximal to third MCP ?  ?nttp over the snauff box, dorsal carpals, volar carpals, radial styloid, ulnar styloid, 1st mcp, tfcc ?Negative Tinel's ?Negative finklestein ?Neg tfcc bounce test ?  ? ? ?Electronically signed by:  ? D.Aleen Sells ?[facility] ?2:53 PM 11/27/21 ?

## 2021-11-27 NOTE — Patient Instructions (Signed)
Please stop at the first floor to make appt with Sports Medicine for the right trigger finger ? ?Please continue all other medications as before, and refills have been done if requested. ? ?Please have the pharmacy call with any other refills you may need. ? ?Please continue your efforts at being more active, low cholesterol diet, and weight control. ? ?You are otherwise up to date with prevention measures today. ? ?Please keep your appointments with your specialists as you may have planned ? ?Please go to the LAB at the blood drawing area for the tests to be done ? ?You will be contacted by phone if any changes need to be made immediately.  Otherwise, you will receive a letter about your results with an explanation, but please check with MyChart first. ? ?Please remember to sign up for MyChart if you have not done so, as this will be important to you in the future with finding out test results, communicating by private email, and scheduling acute appointments online when needed. ? ?Please make an Appointment to return in 6 months, or sooner if needed ?

## 2021-11-27 NOTE — Patient Instructions (Addendum)
Good to see you 2 week follow up  

## 2021-11-28 NOTE — Progress Notes (Signed)
? ?Date:  12/07/2021  ? ?ID:  Ronald [name], DOB 08-08-1930, MRN [id number] ? ?Provider Location: Office ? ?PCP:  [name], MD  ?Cardiologist:   Eden Emms ?Electrophysiologist:  None  ? ?Evaluation Performed:  Follow-Up Visit ? ?Chief Complaint:  CAD/CABG ? ?History of Present Illness:   ? ?88 y.o. history of CABG 1998 Cath 2003 with occluded SVG RCA Rx medically Activity limited by bilateral foot drop. No angina. Wife is battling lung cancer. Uses lasix for LE edema. 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 ?No angina ? ?LDL at goal on statin 54 11/27/21  ? ?Has senile purura/bruising with poor balance using cane No falls PLT ok at 223  ? ?Sees [name] neurology for tremor and memory changes on Inderal and declined cognitive testing  ? 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 ? ?Past Medical History:  ?Diagnosis Date  ? ALLERGIC RHINITIS 11/25/2007  ? ANXIETY 11/25/2007  ? COLONIC POLYPS, HX OF 11/25/2007  ? CORONARY ARTERY DISEASE 11/25/2007  ? Cough 01/04/2009  ? HEMORRHOIDS, INTERNAL 11/06/2010  ? HOARSENESS 01/04/2009  ? HYPERLIPIDEMIA 11/25/2007  ? HYPERTENSION 11/25/2007  ? Impaired glucose tolerance 04/21/2013  ? LOW BACK PAIN 11/25/2007  ? Lumbar radiculopathy, chronic   ? with right foot drop  ? PEPTIC ULCER DISEASE 11/25/2007  ? 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 Preventative health care 04/07/2011  ? PSA, INCREASED 01/05/2009  ? Right foot drop   ? Spasmodic dysphonia   ? TREMOR, ESSENTIAL 11/25/2007  ? ?Past Surgical History:  ?Procedure Laterality Date  ? APPENDECTOMY    ? BACK SURGERY    ? CORONARY  ARTERY BYPASS GRAFT    ? s/p lumbar laminectomy and fusion    ? TONSILLECTOMY    ?  ? ?Current Meds  ?Medication Sig  ? aspirin 81 MG tablet Take 81 mg by mouth daily.  ? atorvastatin (LIPITOR) 40 MG tablet Take 1 tablet (40 mg total) by mouth every evening.  ? b complex vitamins tablet Take 1 tablet by mouth daily.  ? Cholecalciferol (VITAMIN D) 2000 UNITS CAPS Take 2,000 Units by mouth daily.  ? Coenzyme Q10 (COQ10 PO) Take 1 tablet by mouth daily.   ? donepezil (ARICEPT) 5 MG tablet Take 1 tablet (5 mg total) by mouth at bedtime.  ? furosemide (LASIX) 20 MG tablet Take 1 tablet by mouth once daily  ? OMEGA 3 1200 MG CAPS Take 1 capsule by mouth 2 (two) times daily.   ? propranolol ER (INDERAL LA) 60 MG 24 hr capsule Take 1 capsule (60 mg total) by mouth daily.  ? telmisartan (MICARDIS) 40 MG tablet Take 1 tablet (40 mg total) by mouth daily.  ?  ? ?Allergies:   Aricept [donepezil]  ? ?Social History  ? ?Tobacco Use  ? Smoking status: Never  ? Smokeless tobacco: Never  ?Vaping Use  ? Vaping Use: Never used  ?Substance Use Topics  ? Alcohol use: Not Currently  ?  Comment: Occ  ? Drug use:  No  ?  ? ?Family Hx: ?The patient's family history includes Colon cancer in his father. ? ?ROS:   ?Please see the history of present illness.    ? ?All other systems reviewed and are negative. ? ? ?Prior CV studies:   ?The following studies were reviewed today: ? ?Myvue 2008 ?Carotid 2012  ? ?Labs/Other Tests and Data Reviewed:   ? ?EKG:  SR LAD / RBBB rate 60 no acute changes 12/07/2021  ? ?Recent Labs: ?11/27/2021: ALT 20; BUN 23; Creatinine, Ser 1.22; Hemoglobin 11.6; Platelets 223.0; Potassium 4.5; Sodium 138; TSH 2.62  ? ?Recent Lipid Panel ?Lab Results  ?Component Value Date/Time  ? CHOL 135 11/27/2021 02:15 PM  ? TRIG 103.0 11/27/2021 02:15 PM  ? HDL 60.60 11/27/2021 02:15 PM  ? CHOLHDL 2 11/27/2021 02:15 PM  ? LDLCALC 54 11/27/2021 02:15 PM  ? ? ?Wt Readings from Last 3 Encounters:  ?12/07/21 174 lb (78.9 kg)  ?11/27/21 174  lb (78.9 kg)  ?11/27/21 174 lb (78.9 kg)  ?  ? ?Objective:   ? ?Vital Signs:  BP 120/78   Pulse (!) 55   Ht 5\' 10"  (1.778 m)   Wt 174 lb (78.9 kg)   SpO2 98%   BMI 24.97 kg/m?   ? ?Affect appropriate ?Healthy:  appears stated age ?HEENT: normal ?Neck supple with no adenopathy ?JVP normal no bruits no thyromegaly ?Lungs clear with no wheezing and good diaphragmatic motion ?Heart:  S1/S2 no murmur, no rub, gallop or click ?PMI normal post sternotomy  ?Abdomen: benighn, BS positve, no tenderness, no AAA ?no bruit.  No HSM or HJR ?Distal pulses intact with no bruits ?Trace LE  edema ?Neuro non-focal head tremor in yes direction  ?Skin warm and dry ?Chronic right foot drop  ?Right middle finger trigger finger  ? ? ?ASSESSMENT & PLAN:   ? ?1. CAD/CABG:  CABG 98 cath 2003 occluded RCA graft.  Myovue 2008 non ischemic Active working out at gym everyday no symptoms continue medical Rx stable ?  ?2. HTN: Well controlled.  Continue current medications and low sodium Dash type diet.  Inderal/Micardis  ?  ?3. Chol:  - at goal LDL 61 11/16/19 continue statin  ? ?4. Bifasicular block:  stable no high grade AV block yearly ECG  ? ?5. Edema:  Dependant continue lasix low sodium diet labs with primary  ? ?6. Ortho:  F/u with Dr 01/16/20 for injured left shoulder still with some restriction to motion   ? ?7. Neuro:  F/u [name] tremor and right foot drop with some cognitive impairment  ? ? ?COVID-19 Education: ?The signs and symptoms of COVID-19 were discussed with the patient and how to seek care for testing (follow up with PCP or arrange E-visit).  The importance of social distancing was discussed today. ? ? ?Medication Adjustments/Labs and Tests Ordered: ?Current medicines are reviewed at length with the patient today.  Concerns regarding medicines are outlined above.  ? ?Tests Ordered: ? ?None  ? ?Medication Changes: ? ?None  ? ?Disposition:  Follow up  in a year ? ?Signed, ?Katrinka Blazing, MD  ?12/07/2021 10:05 AM    ?[facility] ?

## 2021-11-30 ENCOUNTER — Encounter: Payer: Self-pay | Admitting: Internal Medicine

## 2021-11-30 NOTE — Assessment & Plan Note (Addendum)
With mild worsening and pain, declines hand surgury referral for now but may call later, o/w ok for sport med referral ?

## 2021-11-30 NOTE — Assessment & Plan Note (Signed)
Lab Results  ?Component Value Date  ? HGBA1C 6.3 11/27/2021  ? ?Stable, pt to continue current medical treatment  - diet ? ?

## 2021-11-30 NOTE — Assessment & Plan Note (Signed)

## 2021-11-30 NOTE — Assessment & Plan Note (Signed)
Stable overall,  to f/u any worsening symptoms or concerns 

## 2021-11-30 NOTE — Assessment & Plan Note (Signed)
BP Readings from Last 3 Encounters:  ?11/27/21 (!) 118/58  ?11/27/21 (!) 118/58  ?05/29/21 (!) 150/66  ? ?Stable, pt to continue medical treatment inderal, micardis ? ?

## 2021-12-07 ENCOUNTER — Encounter: Payer: Self-pay | Admitting: Cardiovascular Disease

## 2021-12-07 ENCOUNTER — Other Ambulatory Visit: Payer: Self-pay

## 2021-12-07 ENCOUNTER — Ambulatory Visit: Payer: Medicare Other | Admitting: Cardiovascular Disease

## 2021-12-07 VITALS — BP 120/78 | HR 55 | Ht 70.0 in | Wt 174.0 lb

## 2021-12-07 DIAGNOSIS — I25810 Atherosclerosis of coronary artery bypass graft(s) without angina pectoris: Secondary | ICD-10-CM

## 2021-12-07 DIAGNOSIS — Z951 Presence of aortocoronary bypass graft: Secondary | ICD-10-CM | POA: Diagnosis not present

## 2021-12-07 DIAGNOSIS — E782 Mixed hyperlipidemia: Secondary | ICD-10-CM

## 2021-12-07 DIAGNOSIS — R251 Tremor, unspecified: Secondary | ICD-10-CM | POA: Diagnosis not present

## 2021-12-07 DIAGNOSIS — I452 Bifascicular block: Secondary | ICD-10-CM

## 2021-12-07 NOTE — Patient Instructions (Signed)
Medication Instructions:  ?NO CHANGES ?*If you need a refill on your cardiac medications before your next appointment, please call your pharmacy* ? ? ?Lab Work: ?NONE ?If you have labs (blood work) drawn today and your tests are completely normal, you will receive your results only by: ?MyChart Message (if you have MyChart) OR ?A paper copy in the mail ?If you have any lab test that is abnormal or we need to change your treatment, we will call you to review the results. ? ? ?Testing/Procedures: ?NONE ? ? ?Follow-Up: ?At [facility], you and your health needs are our priority.  As part of our continuing mission to provide you with exceptional heart care, we have created designated Provider Care Teams.  These Care Teams include your primary Cardiologist (physician) and Advanced Practice Providers (APPs -  Physician Assistants and Nurse Practitioners) who all work together to provide you with the care you need, when you need it. ? ?We recommend signing up for the patient portal called "MyChart".  Sign up information is provided on this After Visit Summary.  MyChart is used to connect with patients for Virtual Visits (Telemedicine).  Patients are able to view lab/test results, encounter notes, upcoming appointments, etc.  Non-urgent messages can be sent to your provider as well.   ?To learn more about what you can do with MyChart, go to [email or web address].   ? ?Your next appointment:   ?1 year(s) ? ?The format for your next appointment:   ?In Person ? ?Provider:   ?Peter Nishan, MD   ? ? ?Other Instructions ?NONE  ?

## 2021-12-10 NOTE — Progress Notes (Signed)
? ? Ronald [name] ?[facility] ?[place] ?Phone: [phone] ?  ?Assessment and Plan:   ? ?1. Right hand pain ?2. Acquired trigger finger of right middle finger ?-Acute, uncomplicated, subsequent visit ?- Right third digit trigger finger based on HPI, physical exam that has significantly improved, though not completely resolved after CSI at last office visit ?- Patient elected for repeat CSI.  Tolerated well per note below ?- May continue Tylenol for day-to-day pain relief ?- Recommend using topical Voltaren gel and night splint ? ?Procedure: Flexor Tendon Sheath Injection (Trigger Finger) ?Side: Right 3rd digit ? ?Indication: @DIAGXWITHICD @ ? ?After explaining the procedure, viable alternatives, risks, and answering any questions, consent was given verbally.  The site was cleaned with alcohol prep.  A steroid injection was performed under ultrasound guidance using 0.88ml of 1% lidocaine without epinephrine and 20 mg of Kenalog 40 with sterile technique.  This was well tolerated and resulted in  relief.  Needle was removed and dressing placed and post injection instructions were given including  a discussion of likely return of pain today after the anesthetic wears off (with the possibility of worsened pain) until the steroid starts to work in 3-5 days.   Pt was advised to call or return to clinic if these symptoms worsen or fail to improve as anticipated.  If not at least 50% better in 6 weeks would consider repeat injection. ? ?  ?Pertinent previous records reviewed include none ?  ?Follow Up: 3 weeks for reevaluation.  Patient may call and cancel if he is feeling 100% improved ?  ?Subjective:   ?I, Pincus Badder, am serving as a Education administrator for [employer] ? ?Chief Complaint: right hand pain  ? ?HPI:  ?  ?11/27/21 ?Patient is a 88 year old male complaining of trigger finger of the right hand. Patient states third digit will be able to use it  during the day and at night it collapses and locks and hurts badly he has to pull it and it hurts badly it feels like his finger is breaking been going on for about 2 weeks , after it happens when he rubs his palm he can feel the pain in his joint  ?  ?12/11/2021 ?Patient states 90% healed but his 3rd finger is still giving a little resistance at night if his hand is loose his 3rd finger drops down and locks still slightly but it doesn't hurt like it used to ?  ?Relevant Historical Information: Hypertension, PUD ? ?Additional pertinent review of systems negative. ? ? ?Current Outpatient Medications:  ?  aspirin 81 MG tablet, Take 81 mg by mouth daily., Disp: , Rfl:  ?  atorvastatin (LIPITOR) 40 MG tablet, Take 1 tablet (40 mg total) by mouth every evening., Disp: 90 tablet, Rfl: 3 ?  b complex vitamins tablet, Take 1 tablet by mouth daily., Disp: , Rfl:  ?  Cholecalciferol (VITAMIN D) 2000 UNITS CAPS, Take 2,000 Units by mouth daily., Disp: , Rfl:  ?  clotrimazole-betamethasone (LOTRISONE) cream, Use as directed twice per day as needed, Disp: 45 g, Rfl: 1 ?  Coenzyme Q10 (COQ10 PO), Take 1 tablet by mouth daily. , Disp: , Rfl:  ?  donepezil (ARICEPT) 5 MG tablet, Take 1 tablet (5 mg total) by mouth at bedtime., Disp: 90 tablet, Rfl: 3 ?  furosemide (LASIX) 20 MG tablet, Take 1 tablet by mouth once daily, Disp: 90 tablet, Rfl: 0 ?  OMEGA 3  1200 MG CAPS, Take 1 capsule by mouth 2 (two) times daily. , Disp: , Rfl:  ?  propranolol ER (INDERAL LA) 60 MG 24 hr capsule, Take 1 capsule (60 mg total) by mouth daily., Disp: 90 capsule, Rfl: 3 ?  telmisartan (MICARDIS) 40 MG tablet, Take 1 tablet (40 mg total) by mouth daily., Disp: 90 tablet, Rfl: 3  ? ?Objective:   ?  ?Vitals:  ? 12/11/21 0952  ?BP: 136/78  ?Pulse: (!) 56  ?SpO2: 97%  ?Weight: 174 lb (78.9 kg)  ?Height: 5\' 10"  (1.778 m)  ?  ?  ?Body mass index is 24.97 kg/m?.  ?  ?Physical Exam:   ? ?General: Appears well, nad, nontoxic and pleasant ?Neuro:sensation intact,  strength is 5/5 with df/pf/inv/ev, muscle tone wnl ?Skin:no susupicious lesions or rashes ?  ?Right hand/wrist:  No deformity or swelling appreciated. ?Triggering of third digit with palpable nodule just proximal to third MCP, though less severe compared with prior office visit and no pain with palpation ?  ?nttp over the snauff box, dorsal carpals, volar carpals, radial styloid, ulnar styloid, 1st mcp, tfcc ?Negative Tinel's ?Negative finklestein ?Neg tfcc bounce test ? ? ?Electronically signed by:  ?Ronald [name] ?[facility] ?10:30 AM 12/11/21 ?

## 2021-12-11 ENCOUNTER — Other Ambulatory Visit: Payer: Self-pay

## 2021-12-11 ENCOUNTER — Ambulatory Visit: Payer: Medicare Other | Admitting: Sports Medicine

## 2021-12-11 ENCOUNTER — Ambulatory Visit (INDEPENDENT_AMBULATORY_CARE_PROVIDER_SITE_OTHER): Payer: Medicare Other | Admitting: Sports Medicine

## 2021-12-11 VITALS — BP 136/78 | HR 56 | Ht 70.0 in | Wt 174.0 lb

## 2021-12-11 DIAGNOSIS — M79641 Pain in right hand: Secondary | ICD-10-CM

## 2021-12-11 DIAGNOSIS — M65331 Trigger finger, right middle finger: Secondary | ICD-10-CM | POA: Diagnosis not present

## 2021-12-11 NOTE — Patient Instructions (Addendum)
Good to see you  ?Recommend using Voltaren gel on painful areas ?Recommend using night splint  ?3 week follow up can call and cancel appointment if you are 100% feeling better ? ?

## 2021-12-20 ENCOUNTER — Other Ambulatory Visit: Payer: Self-pay | Admitting: Cardiovascular Disease

## 2022-01-01 ENCOUNTER — Ambulatory Visit: Payer: Medicare Other | Admitting: Sports Medicine

## 2022-01-16 ENCOUNTER — Ambulatory Visit
Admission: EM | Admit: 2022-01-16 | Discharge: 2022-01-16 | Disposition: A | Discharge status: Home / Self Care | Payer: Medicare Other | Attending: Emergency Medicine | Admitting: Emergency Medicine

## 2022-01-16 DIAGNOSIS — S61215A Laceration without foreign body of left ring finger without damage to nail, initial encounter: Secondary | ICD-10-CM | POA: Diagnosis not present

## 2022-01-16 NOTE — ED Provider Notes (Signed)
?[facility] ? ? ? ?CSN: [id number] ?Arrival date & time: 01/16/22  1502 ?  ? ?HISTORY  ? ?Chief Complaint  ?Patient presents with  ? Laceration  ? ?HPI ?Ronald [name] is a 88 y.o. male. Patient presents to urgent care today after cutting his left fourth finger on the side with a sharp pair of scissors at home 3 hours ago.  According to his wife and daughter who are with him today, patient was home alone when this happened, they were out at a doctor's appointment for the wife.  Wife states they ran cold water over his finger and began applying paper towels to soak up the blood, states that he was passing large clots of blood soaking through multiple paper towels.  Daughter states that they tried applying intermittent pressure to the wound but have not been able to get it to stop bleeding.  Daughter states that patient is not currently taking any blood thinners.  Patient states he does currently take a baby aspirin every day.  Daughter states that they have not attempted to apply pressure bandage to the wound. ? ?The history is provided by the patient, the spouse and a relative.  ?Past Medical History:  ?Diagnosis Date  ? ALLERGIC RHINITIS 11/25/2007  ? ANXIETY 11/25/2007  ? COLONIC POLYPS, HX OF 11/25/2007  ? CORONARY ARTERY DISEASE 11/25/2007  ? Cough 01/04/2009  ? HEMORRHOIDS, INTERNAL 11/06/2010  ? HOARSENESS 01/04/2009  ? HYPERLIPIDEMIA 11/25/2007  ? HYPERTENSION 11/25/2007  ? Impaired glucose tolerance 04/21/2013  ? LOW BACK PAIN 11/25/2007  ? Lumbar radiculopathy, chronic   ? with right foot drop  ? PEPTIC ULCER DISEASE 11/25/2007  ? 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 Preventative health care 04/07/2011  ? PSA, INCREASED 01/05/2009  ? Right foot drop   ? Spasmodic dysphonia   ? TREMOR, ESSENTIAL 11/25/2007  ? ?Patient Active Problem List  ? Diagnosis Date Noted  ? Right trigger finger 11/27/2021  ? Dementia (HCC) 11/27/2020  ? Balance disorder 11/27/2020  ? Senile purpura (HCC)  05/27/2020  ? ac arthritis  08/31/2019  ? Left upper arm pain 08/10/2019  ? Macrocytosis 05/14/2019  ? Trigger index finger of right hand 03/12/2018  ? 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 Chronic left shoulder pain 05/08/2017  ? Recurrent UTI 11/06/2016  ? Dysuria 11/07/2015  ? Rash 03/09/2015  ? Family history of malignant neoplasm of gastrointestinal tract 03/10/2014  ? Shingles outbreak 08/17/2013  ? Impaired glucose tolerance 04/21/2013  ? Peripheral edema 04/20/2012  ? Bilateral hearing loss 04/16/2011  ? Lumbar radiculopathy, chronic   ? Encounter for well adult exam with abnormal findings 04/07/2011  ? HEMORRHOIDS, INTERNAL 11/06/2010  ? PSA, INCREASED 01/05/2009  ? HOARSENESS 01/04/2009  ? Elevated lipids 11/25/2007  ? ANXIETY 11/25/2007  ? Essential tremor 11/25/2007  ? Essential hypertension 11/25/2007  ? Coronary atherosclerosis 11/25/2007  ? ALLERGIC RHINITIS 11/25/2007  ? PEPTIC ULCER DISEASE 11/25/2007  ? LOW BACK PAIN 11/25/2007  ? COLONIC POLYPS, HX OF 11/25/2007  ? ?Past Surgical History:  ?Procedure Laterality Date  ? APPENDECTOMY    ? BACK SURGERY    ? CORONARY ARTERY BYPASS GRAFT    ? s/p lumbar laminectomy and fusion    ? TONSILLECTOMY    ? ? ?Home Medications   ? ?Prior to Admission medications   ?Medication Sig Start Date End Date Taking? Authorizing Provider  ?aspirin 81 MG tablet Take 81 mg by mouth daily.  [provider]  ?atorvastatin (LIPITOR) 40 MG tablet Take 1 tablet (40 mg total) by mouth every evening. 11/27/21   Corwin LevinsJohn, James W, MD  ?b complex vitamins tablet Take 1 tablet by mouth daily.    [provider]  ?Cholecalciferol (VITAMIN D) 2000 UNITS CAPS Take 2,000 Units by mouth daily.    [provider]  ?clotrimazole-betamethasone (LOTRISONE) cream Use as directed twice per day as needed 11/12/18   Corwin LevinsJohn, James W, MD  ?Coenzyme Q10 (COQ10 PO) Take 1 tablet by mouth daily.     [provider]  ?donepezil (ARICEPT) 5 MG  tablet Take 1 tablet (5 mg total) by mouth at bedtime. 11/27/21   Corwin LevinsJohn, James W, MD  ?furosemide (LASIX) 20 MG tablet Take 1 tablet (20 mg total) by mouth daily. 12/20/21   Wendall StadeNishan, Peter C, MD  ?OMEGA 3 1200 MG CAPS Take 1 capsule by mouth 2 (two) times daily.     [provider]  ?propranolol ER (INDERAL LA) 60 MG 24 hr capsule Take 1 capsule (60 mg total) by mouth daily. 11/27/21   Corwin LevinsJohn, James W, MD  ?telmisartan (MICARDIS) 40 MG tablet Take 1 tablet (40 mg total) by mouth daily. 11/27/21   Corwin LevinsJohn, James W, MD  ? ? ?Family History ?Family History  ?Problem Relation Age of Onset  ? Colon cancer Father   ?     colon  ? ?Social History ?Social History  ? ?Tobacco Use  ? Smoking status: Never  ? Smokeless tobacco: Never  ?Vaping Use  ? Vaping Use: Never used  ?Substance Use Topics  ? Alcohol use: Not Currently  ?  Comment: Occ  ? Drug use: No  ? ?Allergies   ?Aricept [donepezil] ? ?Review of Systems ?Review of Systems ?Pertinent findings noted in history of present illness.  ? ?Physical Exam ?Triage Vital Signs ?ED Triage Vitals  ?Enc Vitals Group  ?   BP 07/13/21 0827 (!) 147/82  ?   Pulse Rate 07/13/21 0827 72  ?   Resp 07/13/21 0827 18  ?   Temp 07/13/21 0827 98.3 ?F (36.8 ?C)  ?   Temp Source 07/13/21 0827 Oral  ?   SpO2 07/13/21 0827 98 %  ?   Weight --   ?   Height --   ?   Head Circumference --   ?   Peak Flow --   ?   Pain Score 07/13/21 0826 5  ?   Pain Loc --   ?   Pain Edu? --   ?   Excl. in GC? --   ?No data found. ? ?Updated Vital Signs ?BP (!) 161/71 (BP Location: Left Arm)   Pulse 64   Temp 98.4 ?F (36.9 ?C) (Oral)   Resp 18   SpO2 94%  ? ?Physical Exam ? ?Visual Acuity ?Right Eye Distance:   ?Left Eye Distance:   ?Bilateral Distance:   ? ?Right Eye Near:   ?Left Eye Near:    ?Bilateral Near:    ? ?UC Couse / Diagnostics / Procedures:  ?  ?EKG ? ?Radiology ?No results found. ? ?Procedures ?Laceration Repair ? ?Date/Time: 01/16/2022 4:23 PM ?Performed by: Theadora RamaMorgan, Marquin Patino Scales, PA-C ?Authorized by:  Theadora RamaMorgan, Cory Kitt Scales, PA-C  ? ?Consent:  ?  Consent obtained:  Verbal ?  Consent given by:  Patient ?  Risks, benefits, and alternatives were discussed: yes   ?  Risks discussed:  Infection, need for additional repair, pain, poor cosmetic result, retained foreign  body and poor wound healing ?  Alternatives discussed:  No treatment, delayed treatment, observation and referral ?Universal protocol:  ?  Procedure explained and questions answered to patient or proxy's satisfaction: yes   ?  Patient identity confirmed:  Verbally with patient and arm band ?Anesthesia:  ?  Anesthesia method:  None ?Laceration details:  ?  Location:  Finger ?  Finger location:  L ring finger ?  Length (cm):  0.5 ?  Depth (mm):  1 ?Exploration:  ?  Limited defect created (wound extended): no   ?  Hemostasis obtained with: Hemostasis could not be achieved after pressure dressing placed for 15 minutes. ?  Imaging outcome: foreign body not noted   ?  Wound exploration: wound explored through full range of motion   ?  Wound extent comment:  Dermis between the 2 edges of the wound was absent due to patient cutting himself with a pair of scissors ?  Contaminated: no   ?Treatment:  ?  Area cleansed with:  Povidone-iodine ?  Amount of cleaning:  Standard ?  Debridement:  None ?  Undermining:  None ?  Scar revision: no   ?Skin repair:  ?  Repair method:  Steri-Strips ?  Number of Steri-Strips:  2 (To quarter-inch Steri-Strips) ?Approximation:  ?  Approximation:  Close (Hemostasis was achieved once the 2 edges of the wound were approximated with Steri-Strips.) ?Post-procedure details:  ?  Dressing:  Bulky dressing ?  Procedure completion:  Tolerated well, no immediate complications (Patient had < 2 seconds capillary refill at the distal end of his left fourth finger after Steri-Strips were placed.) (including critical care time) ? ?UC Diagnoses / Final Clinical Impressions(s)   ?I have reviewed the triage vital signs and the nursing  notes. ? ?Pertinent labs & imaging results that were available during my care of the patient were reviewed by me and considered in my medical decision making (see chart for details).   ? ?Final diagnoses:  ?Laceration of left rin

## 2022-01-16 NOTE — Discharge Instructions (Signed)
Please remove just the brown tape and white nonstick dressing from your wound after 24 hours.  The wound is no longer bleeding, please reapply the nonstick dressing and tape without pressure or bulk. ? ? ?It is very important that you keep the wound clean and dry for the next 72 hours, please use one of the purple gloves we provided for you when you have to wash your hands, eat, use the bathroom, pressure teeth etc.   ? ?Please change your dressing every 24 hours, monitoring the wound for any signs of redness, swelling or purulent drainage in between each dressing change.  If you notice any of these findings, please contact our clinic right away and we will provide antibiotics for you. ? ?Thank you for visiting urgent care today.  I appreciate the opportunity to participate in your care. ? ? ?

## 2022-01-16 NOTE — ED Triage Notes (Signed)
Pt states while cutting a package with scissors he cut his left 4th finger. There is active bleeding at this time (pressure dressing applied).  ?

## 2022-04-11 DIAGNOSIS — B354 Tinea corporis: Secondary | ICD-10-CM | POA: Diagnosis not present

## 2022-05-08 ENCOUNTER — Telehealth: Payer: Self-pay | Admitting: Internal Medicine

## 2022-05-08 NOTE — Telephone Encounter (Signed)
N/A unable to leave a  message for patient to call back to schedule Medicare Annual Wellness Visit   Last AWV  11/12/18  Please schedule at anytime with [facility] Advisor if patient calls the office back.     Any questions, please call me at [phone]

## 2022-05-26 IMAGING — DX DG HAND COMPLETE 3+V*R*
3 series · 3 of 3 positions shown · non-contrast
Comparison: Right second and third finger radiographs 03/12/2018.

CLINICAL DATA: Right third digit locking/trigger finger for 2
weeks. Right hand pain.

EXAM:
RIGHT HAND - COMPLETE 3+ VIEW

[hand ap]
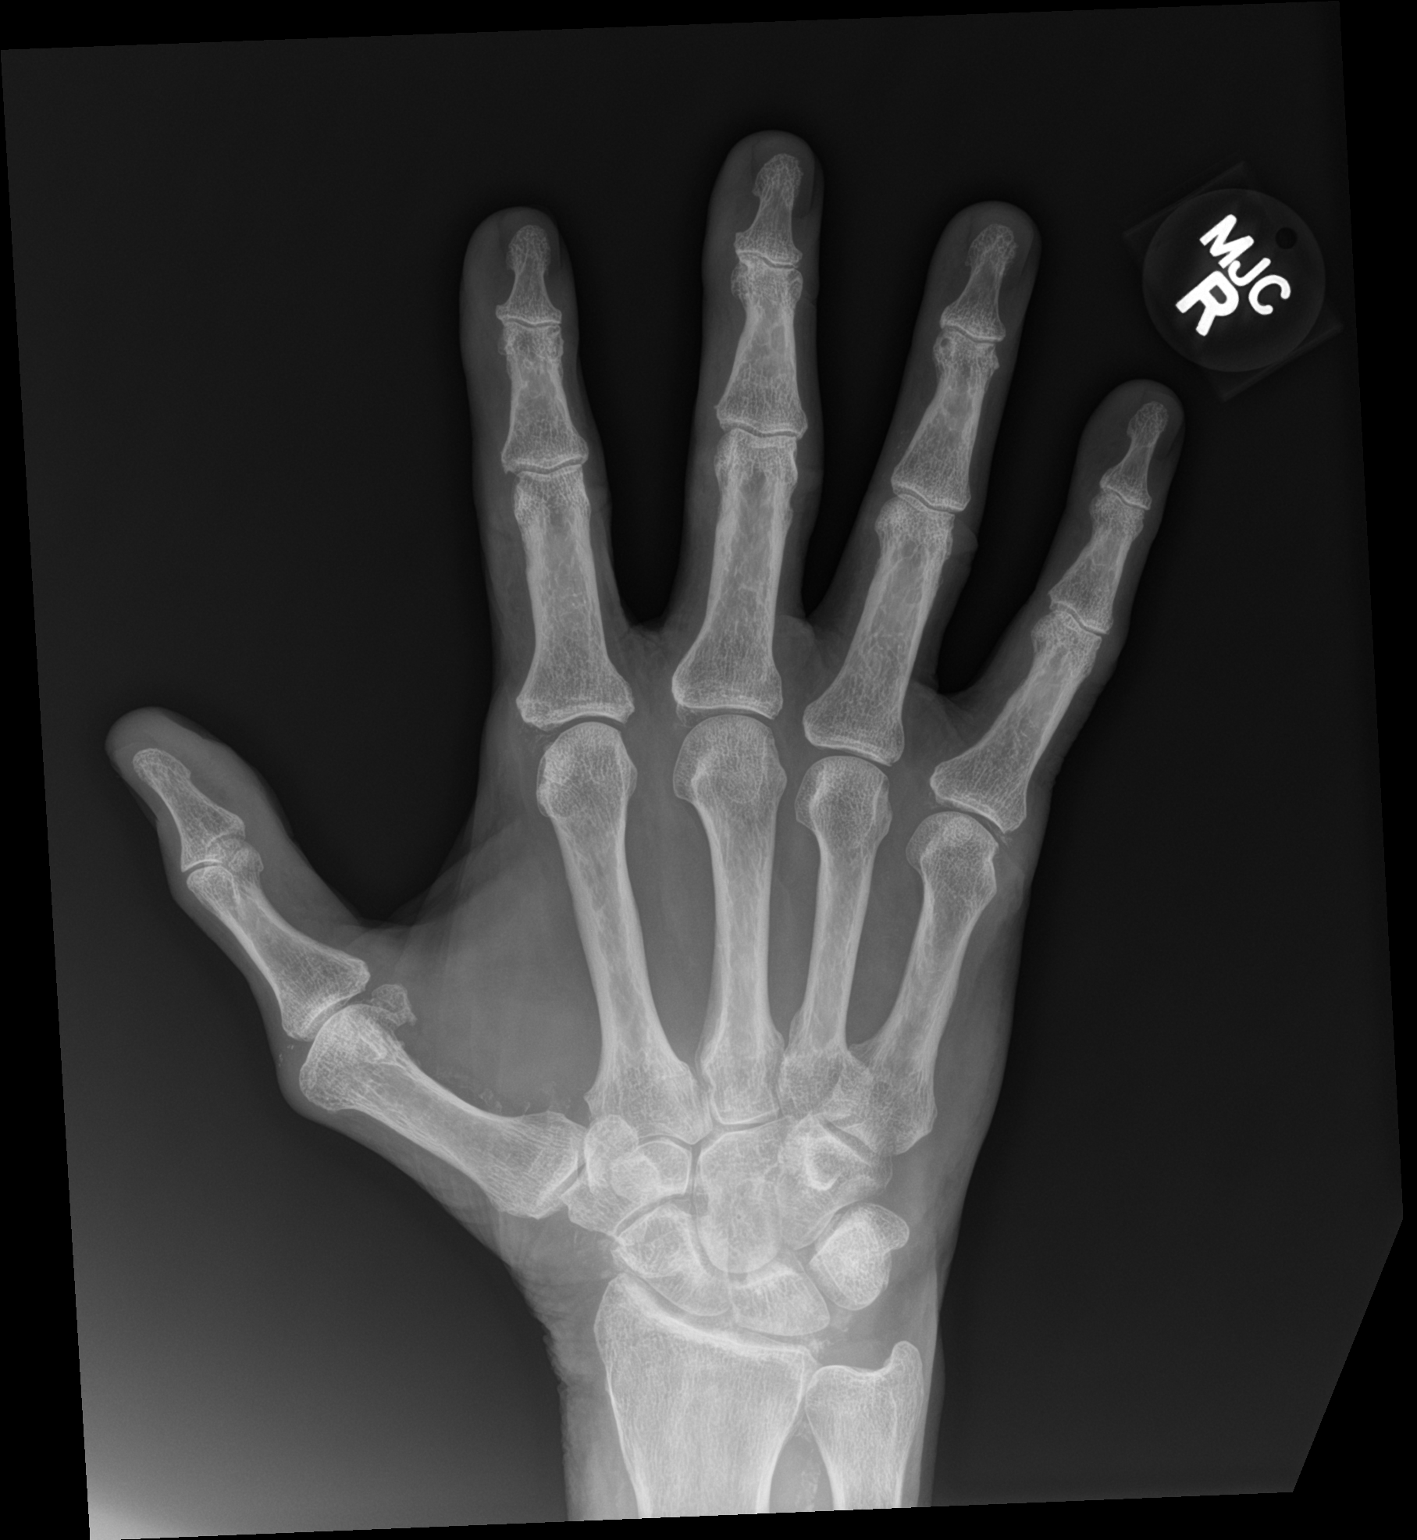

[hand obl]
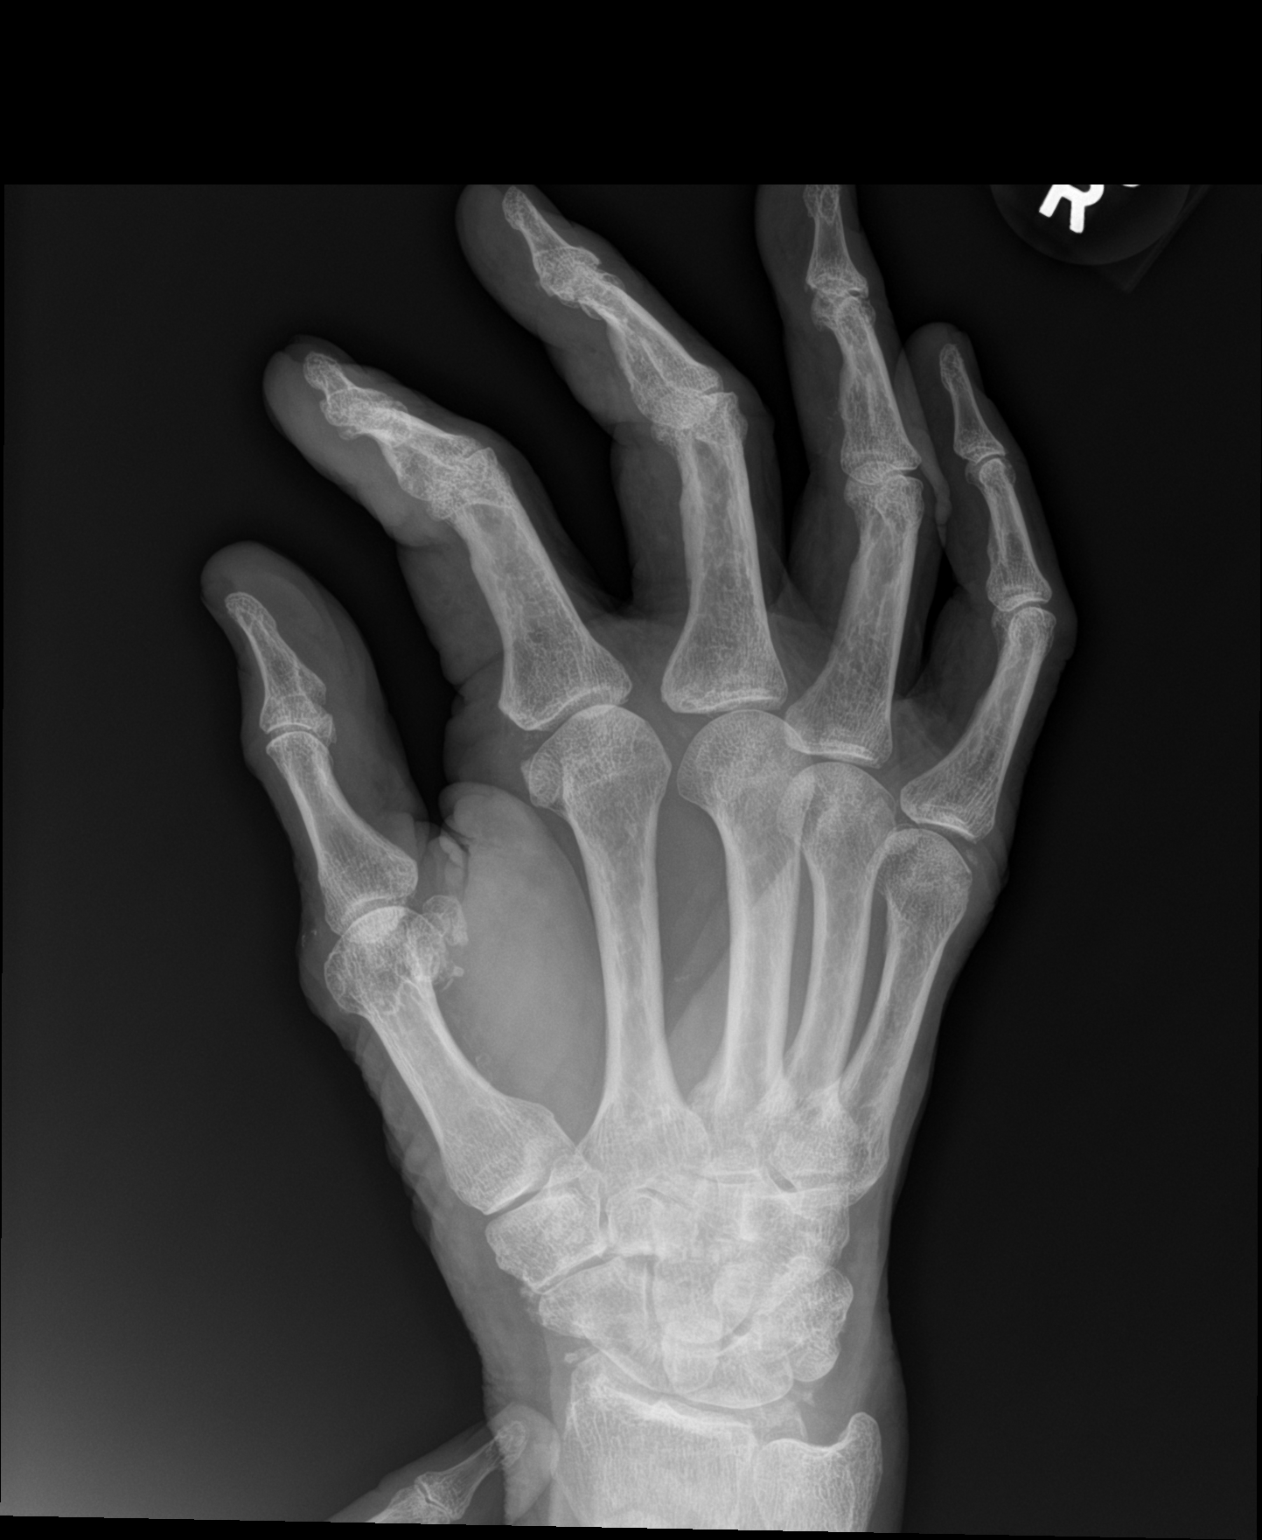

[hand lat]
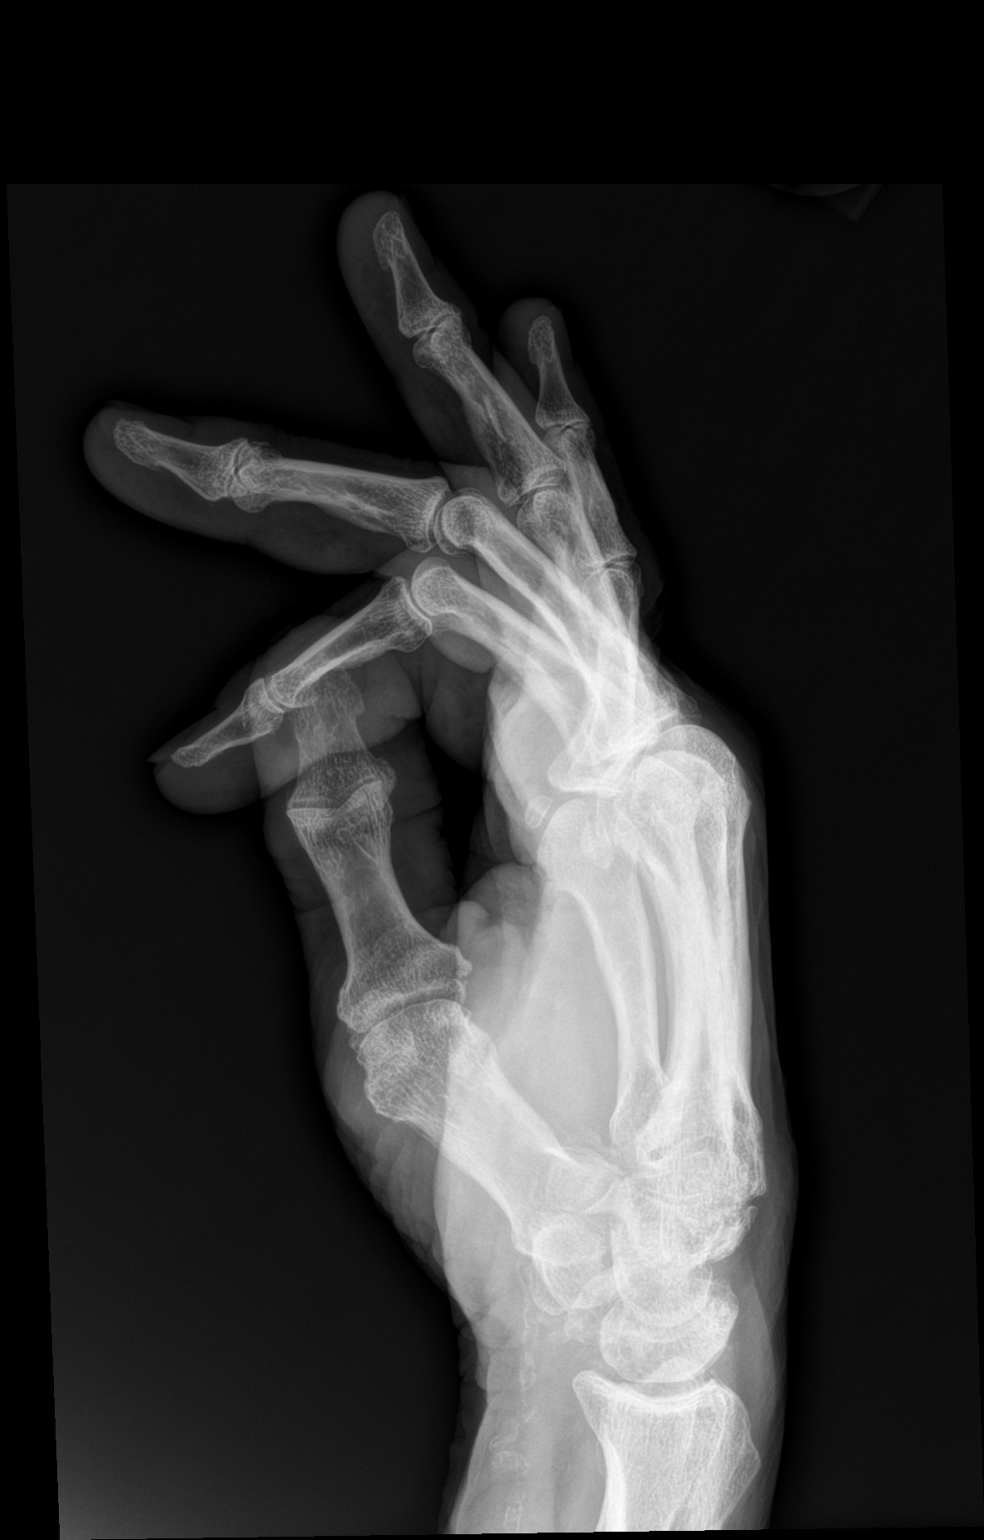

[3 of 3 positions shown; findings below may reference images not displayed]

FINDINGS: There are degenerative changes including joint space narrowing,
subchondral sclerosis, and peripheral osteophytosis there are
mild-to-moderate at the triscaphe joint and thumb carpometacarpal
joint mild-to-moderate at the thumb metacarpophalangeal joint and
mild-to-moderate at the second through fourth DIP joints. No acute
fracture or dislocation. Vascular calcifications are noted.
IMPRESSION: Osteoarthritis, including mild-to-moderate osteoarthritis within the
second through fourth DIP joints.

## 2022-05-30 ENCOUNTER — Ambulatory Visit (INDEPENDENT_AMBULATORY_CARE_PROVIDER_SITE_OTHER): Payer: Medicare Other | Admitting: Internal Medicine

## 2022-05-30 VITALS — BP 122/70 | HR 58 | Temp 98.0°F | Ht 70.0 in | Wt 171.0 lb

## 2022-05-30 DIAGNOSIS — E785 Hyperlipidemia, unspecified: Secondary | ICD-10-CM

## 2022-05-30 DIAGNOSIS — F03A4 Unspecified dementia, mild, with anxiety: Secondary | ICD-10-CM | POA: Diagnosis not present

## 2022-05-30 DIAGNOSIS — R7302 Impaired glucose tolerance (oral): Secondary | ICD-10-CM

## 2022-05-30 DIAGNOSIS — Z23 Encounter for immunization: Secondary | ICD-10-CM | POA: Diagnosis not present

## 2022-05-30 DIAGNOSIS — I10 Essential (primary) hypertension: Secondary | ICD-10-CM | POA: Diagnosis not present

## 2022-05-30 MED ORDER — DONEPEZIL HCL 10 MG PO TABS: 10.0000 mg | ORAL_TABLET | Freq: Every day | ORAL | 3 refills | Status: DC

## 2022-05-30 NOTE — Patient Instructions (Signed)
You had the flu shot today  Ok to increase the aricept to 10 mg per day  Please continue all other medications as before, and refills have been done if requested.  Please have the pharmacy call with any other refills you may need.  Please continue your efforts at being more active, low cholesterol diet, and weight control.  Please keep your appointments with your specialists as you may have planned  [facility] for no lab work today  Please make an Appointment to return in 6 months, or sooner if needed

## 2022-05-30 NOTE — Progress Notes (Unsigned)
Patient ID: Ronald [name], male   DOB: 1929/11/01, 88 y.o.   MRN: [id number]        Chief Complaint: follow up dementia       HPI:  Ronald [name] is a 87 y.o. male here with son who notices memory declining but no worsening behavior symptoms.  Pt denies chest pain, increased sob or doe, wheezing, orthopnea, PND, increased LE swelling, palpitations, dizziness or syncope.   Pt denies polydipsia, polyuria, or new focal neuro s/s.    Pt denies fever, wt loss, night sweats, loss of appetite, or other constitutional symptoms  Due for flu shot.         Wt Readings from Last 3 Encounters:  05/30/22 171 lb (77.6 kg)  12/11/21 174 lb (78.9 kg)  12/07/21 174 lb (78.9 kg)   BP Readings from Last 3 Encounters:  05/30/22 122/70  01/16/22 (!) 161/71  12/11/21 136/78         Past Medical History:  Diagnosis Date   ALLERGIC RHINITIS 11/25/2007   ANXIETY 11/25/2007   COLONIC POLYPS, HX OF 11/25/2007   CORONARY ARTERY DISEASE 11/25/2007   Cough 01/04/2009   HEMORRHOIDS, INTERNAL 11/06/2010   HOARSENESS 01/04/2009   HYPERLIPIDEMIA 11/25/2007   HYPERTENSION 11/25/2007   Impaired glucose tolerance 04/21/2013   LOW BACK PAIN 11/25/2007   Lumbar radiculopathy, chronic    with right foot drop   PEPTIC ULCER DISEASE 11/25/2007   Peroneal nerve lesion of lower extremity, 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 He has never used smokeless tobacco. He reports that he does not currently use alcohol. He reports that he does not use drugs. family history includes Colon cancer in his father. Allergies  Allergen Reactions   Aricept [Donepezil] Other (See Comments)    Crazy dreams    Current Outpatient Medications on File Prior to Visit  Medication Sig Dispense Refill   aspirin 81 MG tablet Take 81 mg by mouth daily.     atorvastatin (LIPITOR) 40 MG tablet Take 1 tablet (40 mg total) by mouth every evening. 90 tablet 3   b complex vitamins tablet Take 1 tablet by mouth daily.     Cholecalciferol (VITAMIN D) 2000 UNITS CAPS Take 2,000 Units by mouth daily.     clotrimazole-betamethasone (LOTRISONE) cream Use as directed twice per day as needed 45 g 1   Coenzyme Q10 (COQ10 PO) Take 1 tablet by mouth daily.      furosemide (LASIX) 20 MG tablet Take 1 tablet (20 mg total) by mouth daily. 90 tablet 3   OMEGA 3 1200 MG CAPS Take 1 capsule by mouth 2 (two) times daily.      propranolol ER (INDERAL LA) 60 MG 24 hr capsule Take 1 capsule (60 mg total) by mouth daily. 90 capsule 3   telmisartan (MICARDIS) 40 MG tablet Take 1 tablet (40 mg total) by mouth daily. 90 tablet 3   triamcinolone cream (KENALOG) 0.1 % Apply topically 2 (two) times daily.     No current facility-administered medications on file prior to visit.  ROS:  All others reviewed and negative.  Objective        PE:  BP 122/70 (BP Location: Right Arm, Patient Position: Sitting, Cuff Size: Large)   Pulse (!) 58   Temp 98 F (36.7 C) (Oral)   Ht 5\' 10"  (1.778 m)   Wt 171 lb (77.6 kg)   SpO2 95%   BMI 24.54 kg/m                 Constitutional: Pt appears in NAD               HENT: Head: NCAT.                Right Ear: External ear normal.                 Left Ear: External ear normal.                Eyes: . Pupils are equal, round, and reactive to light. Conjunctivae and EOM are normal               Nose: without d/c or deformity               Neck: Neck supple. Gross normal ROM               Cardiovascular: Normal rate and regular rhythm.                 Pulmonary/Chest: Effort normal and breath sounds without rales or wheezing.                Abd:  Soft, NT, ND, + BS, no organomegaly                Neurological: Pt is alert. At baseline orientation, motor grossly intact               Skin: Skin is warm. No rashes, no other new lesions, LE edema - none               Psychiatric: Pt behavior is normal without agitation   Micro: none  Cardiac tracings I have personally interpreted today:  none  Pertinent Radiological findings (summarize): none   Lab Results  Component Value Date   WBC 3.8 (L) 11/27/2021   HGB 11.6 (L) 11/27/2021   HCT 33.4 (L) 11/27/2021   PLT 223.0 11/27/2021   GLUCOSE 111 (H) 11/27/2021   CHOL 135 11/27/2021   TRIG 103.0 11/27/2021   HDL 60.60 11/27/2021   LDLCALC 54 11/27/2021   ALT 20 11/27/2021   AST 24 11/27/2021   NA 138 11/27/2021   K 4.5 11/27/2021   CL 103 11/27/2021   CREATININE 1.22 11/27/2021   BUN 23 11/27/2021   CO2 29 11/27/2021   TSH 2.62 11/27/2021   PSA 2.00 01/25/2010   HGBA1C 6.3 11/27/2021   Assessment/Plan:  Ronald [name] is a 88 y.o. White or Caucasian [1] male with  has a past medical history of ALLERGIC RHINITIS (11/25/2007), ANXIETY (11/25/2007), COLONIC POLYPS, HX OF (11/25/2007), CORONARY ARTERY DISEASE (11/25/2007), Cough (01/04/2009), HEMORRHOIDS, INTERNAL (11/06/2010), HOARSENESS (01/04/2009), HYPERLIPIDEMIA (11/25/2007), HYPERTENSION (11/25/2007), Impaired glucose tolerance (04/21/2013), LOW BACK PAIN (11/25/2007), Lumbar radiculopathy, chronic, PEPTIC ULCER DISEASE (11/25/2007), Peroneal nerve lesion of lower extremity, bilateral (05/08/2017), Preventative health care (04/07/2011), PSA, INCREASED (01/05/2009), Right foot drop, Spasmodic dysphonia, and TREMOR, ESSENTIAL (11/25/2007).  Dementia (HCC) Worsening, for increased aricept to 10 mg qd  Elevated lipids Lab Results  Component Value Date  [place] 54 11/27/2021   Stable, pt to continue current statin liiptor 40 mg    Essential hypertension BP Readings from Last 3 Encounters:  05/30/22 122/70  01/16/22 (!) 161/71  12/11/21 136/78   Stable, pt to continue medical  treatment inderal la 60 qd, micardis 40 mg qd   Impaired glucose tolerance Lab Results  Component Value Date   HGBA1C 6.3 11/27/2021   Stable, pt to continue current medical treatment  - diet, wt control, excercise  Followup: Return in about 6 months (around 11/28/2022).  Cathlean Cower, MD 06/02/2022 11:13 AM [facility]

## 2022-06-02 ENCOUNTER — Encounter: Payer: Self-pay | Admitting: Internal Medicine

## 2022-06-02 NOTE — Assessment & Plan Note (Signed)
Lab Results  Component Value Date   HGBA1C 6.3 11/27/2021   Stable, pt to continue current medical treatment  - diet, wt control, excercise

## 2022-06-02 NOTE — Assessment & Plan Note (Signed)
Worsening, for increased aricept to 10 mg qd

## 2022-06-02 NOTE — Assessment & Plan Note (Signed)
BP Readings from Last 3 Encounters:  05/30/22 122/70  01/16/22 (!) 161/71  12/11/21 136/78   Stable, pt to continue medical treatment inderal la 60 qd, micardis 40 mg qd

## 2022-06-02 NOTE — Assessment & Plan Note (Signed)
Lab Results  Component Value Date   LDLCALC 54 11/27/2021   Stable, pt to continue current statin liiptor 40 mg

## 2022-06-12 DIAGNOSIS — L2089 Other atopic dermatitis: Secondary | ICD-10-CM | POA: Diagnosis not present

## 2022-07-17 NOTE — Progress Notes (Unsigned)
Subjective:   Ronald [name] is a 88 y.o. male who presents for Medicare Annual/Subsequent preventive examination. I connected with  Ernst Bowler on 07/18/22 by a audio enabled telemedicine application and verified that I am speaking with the correct person using two identifiers.  Patient Location: Home  Provider Location: Home Office  I discussed the limitations of evaluation and management by telemedicine. The patient expressed understanding and agreed to proceed.  Review of Systems    Deferred to PCP Cardiac Risk Factors include: advanced age (>41men, >31 women);male gender     Objective:    Today's Vitals   07/18/22 1323  PainSc: 1    There is no height or weight on file to calculate BMI.     07/18/2022    1:48 PM 01/02/2021   10:47 AM 12/23/2019    8:08 AM 11/12/2018    9:20 AM 11/06/2016    9:07 AM  Advanced Directives  Does Patient Have a Medical Advance Directive? No Yes Yes Yes Yes  Type of Air cabin crew of [place];Living will Healthcare Power of [place];Living will  Copy of Healthcare Power of Attorney in Chart?    No - copy requested No - copy requested  Would patient like information on creating a medical advance directive? No - Patient declined        Current Medications (verified) Outpatient Encounter Medications as of 07/18/2022  Medication Sig   aspirin 81 MG tablet Take 81 mg by mouth daily.   atorvastatin (LIPITOR) 40 MG tablet Take 1 tablet (40 mg total) by mouth every evening.   Cholecalciferol (VITAMIN D) 2000 UNITS CAPS Take 2,000 Units by mouth daily.   Coenzyme Q10 (COQ10 PO) Take 1 tablet by mouth daily.    donepezil (ARICEPT) 10 MG tablet Take 1 tablet (10 mg total) by mouth at bedtime.   furosemide (LASIX) 20 MG tablet Take 1 tablet (20 mg total) by mouth daily.   propranolol ER (INDERAL LA) 60 MG 24 hr capsule Take 1 capsule (60 mg total) by mouth daily.   telmisartan (MICARDIS) 40 MG tablet  Take 1 tablet (40 mg total) by mouth daily.   triamcinolone cream (KENALOG) 0.1 % Apply topically 2 (two) times daily.   b complex vitamins tablet Take 1 tablet by mouth daily. (Patient not taking: Reported on 07/18/2022)   clotrimazole-betamethasone (LOTRISONE) cream Use as directed twice per day as needed (Patient not taking: Reported on 07/18/2022)   OMEGA 3 1200 MG CAPS Take 1 capsule by mouth 2 (two) times daily.  (Patient not taking: Reported on 07/18/2022)   No facility-administered encounter medications on file as of 07/18/2022.    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   Preventative health care 04/07/2011   PSA, INCREASED 01/05/2009   Right foot drop    Spasmodic dysphonia    TREMOR, ESSENTIAL 11/25/2007   Past Surgical History:  Procedure Laterality Date   APPENDECTOMY  BACK SURGERY     CORONARY ARTERY BYPASS GRAFT     s/p lumbar laminectomy and fusion     TONSILLECTOMY     Family History  Problem Relation Age of Onset   Colon cancer Father        colon   Social History   Socioeconomic History   Marital status: Married    Spouse name: Not on file   Number of children: 3   Years of education: Not on file   Highest education level: Not on file  Occupational History   Occupation: Retired    Associate Professormployer: RETIRED  Tobacco Use   Smoking status: Never   Smokeless tobacco: Never  Vaping Use   Vaping Use: Never used  Substance and Sexual Activity    Alcohol use: Not Currently    Comment: Occ   Drug use: No   Sexual activity: Not Currently  Other Topics Concern   Not on file  Social History Narrative   Son is internist in [place]   Right handed    Social Determinants of Health   Financial Resource Strain: Low Risk  (07/18/2022)   Overall Financial Resource Strain (CARDIA)    Difficulty of Paying Living Expenses: Not hard at all  Food Insecurity: No Food Insecurity (11/12/2018)   Hunger Vital Sign    Worried About Running Out of Food in the Last Year: Never true    Ran Out of Food in the Last Year: Never true  Transportation Needs: No Transportation Needs (07/18/2022)   PRAPARE - Administrator, Civil ServiceTransportation    Lack of Transportation (Medical): No    Lack of Transportation (Non-Medical): No  Physical Activity: Sufficiently Active (07/18/2022)   Exercise Vital Sign    Days of Exercise per Week: 4 days    Minutes of Exercise per Session: 40 min  Stress: No Stress Concern Present (07/18/2022)   [employer] - Occupational Stress Questionnaire    Feeling of Stress : Only a little  Social Connections: Socially Integrated (07/18/2022)   Social Connection and Isolation Panel [NHANES]    Frequency of Communication with Friends and Family: More than three times a week    Frequency of Social Gatherings with Friends and Family: More than three times a week    Attends Religious Services: 1 to 4 times per year    Active Member of [employer] or Organizations: Yes    Attends BankerClub or Organization Meetings: 1 to 4 times per year    Marital Status: Married    Tobacco Counseling Counseling given: Not Answered   Clinical Intake:  Pre-visit preparation completed: No  Pain : 0-10 Pain Score: 1  Pain Type: Chronic pain Pain Location: Back Pain Descriptors / Indicators: Aching, Discomfort Pain Relieving Factors: Tylenol  Pain Relieving Factors: Tylenol  Nutritional Status: BMI of 19-24  Normal Nutritional Risks:  None Diabetes: No  How often do you need to have someone help you when you read instructions, pamphlets, or other written materials from your doctor or pharmacy?: 1 - Never What is the last grade level you completed in school?: college  Diabetic?No  Interpreter Needed?: No  Information entered by :: Blanchie ServeJill Dnyla Antonetti RN   Activities of Daily Living    07/18/2022    1:43 PM  In your present state of health, do you have any difficulty performing the following activities:  Hearing? 0  Vision? 0  Difficulty concentrating or making decisions? 1  Comment reports short-term memory issues  Walking or climbing stairs? 1  Comment uses walker; cane  Dressing or bathing? 0  Doing errands, shopping? 0  Comment reports he feels that he drives safely  Preparing Food and eating ? N  Using the Toilet? N  In the past six months, have you accidently leaked urine? N  Do you have problems with loss of bowel control? N  Managing your Medications? N  Managing your Finances? N  Housekeeping or managing your Housekeeping? Y  Comment family assist    Patient Care Team: Biagio Borg, MD as PCP - General Josue Hector, MD as PCP - Cardiology (Cardiology) Tat, Eustace Quail, DO as Consulting Physician (Neurology)  Indicate any recent Medical Services you may have received from other than [facility] providers in the past year (date may be approximate).     Assessment:   This is a routine wellness examination for Ronald [name].  Hearing/Vision screen No results found.  Dietary issues and exercise activities discussed: Current Exercise Habits: Home exercise routine, Type of exercise: walking (stationary bike), Time (Minutes): 40, Frequency (Times/Week): 4, Weekly Exercise (Minutes/Week): 160, Intensity: Mild, Exercise limited by: orthopedic condition(s)   Goals Addressed             This Visit's Progress    Patient Stated       Maintain current health status and stay as active as possible.      Depression  Screen    07/18/2022    1:35 PM 05/30/2022    2:31 PM 11/27/2021    1:59 PM 11/27/2021    1:40 PM 05/29/2021   10:09 AM 11/27/2020   11:13 AM 11/16/2019    9:13 AM  PHQ 2/9 Scores  PHQ - 2 Score 1 5 0 0 0 1 0  PHQ- 9 Score 2 13         Fall Risk    05/30/2022    2:32 PM 11/27/2021    1:59 PM 11/27/2021    1:40 PM 05/29/2021   10:09 AM 01/02/2021   10:47 AM  Fall Risk   Falls in the past year? 1 0 0 0 0  Number falls in past yr: 0 0 0 0 0  Injury with Fall? 0 0 0 0 0  Risk for fall due to : No Fall Risks   Impaired balance/gait   Follow up Falls evaluation completed        FALL RISK PREVENTION PERTAINING TO THE HOME:  Any stairs in or around the home? Yes  If so, are there any without handrails? Yes  Home free of loose throw rugs in walkways, pet beds, electrical cords, etc? Yes  Adequate lighting in your home to reduce risk of falls? Yes   ASSISTIVE DEVICES UTILIZED TO PREVENT FALLS:  Life alert? No  Use of a cane, walker or w/c? Yes  Grab bars in the bathroom? Yes  Shower chair or bench in shower? Yes  Elevated toilet seat or a handicapped toilet? No   Cognitive Function:    11/06/2016    9:13 AM  MMSE - Mini Mental State Exam  Orientation to time 5  Orientation to Place 5  Registration 3  Attention/ Calculation 5  Recall 2  Language- name 2 objects 2  Language- repeat 1  Language- follow 3 step command 3  Language- read & follow direction 1  Write a sentence 1  Copy design 1  Total score 29        07/18/2022    1:39 PM  6CIT Screen  What Year?  0 points  What time? 0 points  Count back from 20 0 points  Months in reverse 2 points  Repeat phrase 6 points    Immunizations Immunization History  Administered Date(s) Administered   Fluad Quad(high Dose 65+) 05/14/2019, 05/29/2021, 05/30/2022   H1N1 06/16/2008   Influenza Split 05/28/2013   Influenza, High Dose Seasonal PF 06/23/2017, 06/16/2018, 08/05/2018   Influenza,inj,Quad PF,6+ Mos 06/21/2014,  05/07/2016   Influenza-Unspecified 06/15/2015   Moderna Sars-Covid-2 Vaccination 11/09/2019, 11/30/2019   PFIZER(Purple Top)SARS-COV-2 Vaccination 06/10/2020, 02/28/2021, 07/19/2021   Pneumococcal Conjugate-13 04/28/2014   Pneumococcal Polysaccharide-23 09/16/2004, 04/16/2011   Td 01/04/2009   Tdap 05/14/2019   Zoster, Live 08/17/2013    TDAP status: Up to date  Flu Vaccine status: Up to date  Pneumococcal vaccine status: Up to date  Covid-19 vaccine status: Information provided on how to obtain vaccines.   Qualifies for Shingles Vaccine? Yes   Zostavax completed No   Shingrix Completed?: No.    Education has been provided regarding the importance of this vaccine. Patient has been advised to call insurance company to determine out of pocket expense if they have not yet received this vaccine. Advised may also receive vaccine at local pharmacy or Health Dept. Verbalized acceptance and understanding.  Screening Tests Health Maintenance  Topic Date Due   COVID-19 Vaccine (6 - Moderna series) 09/13/2021   Zoster Vaccines- Shingrix (1 of 2) 08/29/2022 (Originally 10/21/1979)   Medicare Annual Wellness (AWV)  07/19/2023   TETANUS/TDAP  05/13/2029   Pneumonia Vaccine 59+ Years old  Completed   INFLUENZA VACCINE  Completed   HPV VACCINES  Aged Out    Health Maintenance  Health Maintenance Due  Topic Date Due   COVID-19 Vaccine (6 - Moderna series) 09/13/2021    Colorectal cancer screening: No longer required.   Lung Cancer Screening: (Low Dose CT Chest recommended if Age 55-80 years, 30 pack-year currently smoking OR have quit w/in 15years.) does not qualify.   Additional Screening:  Vision Screening: Recommended annual ophthalmology exams for early detection of glaucoma and other disorders of the eye. Is the patient up to date with their annual eye exam?  No  Who is the provider or what is the name of the office in which the patient attends annual eye exams? Reports no eye  doctor If pt is not established with a provider, would they like to be referred to a provider to establish care? No , patient declined referral   Dental Screening: Recommended annual dental exams for proper oral hygiene  Community Resource Referral / Chronic Care Management: CRR required this visit?  No   CCM required this visit?  No      Plan:     I have personally reviewed and noted the following in the patient's chart:   Medical and social history Use of alcohol, tobacco or illicit drugs  Current medications and supplements including opioid prescriptions. Patient is not currently taking opioid prescriptions. Functional ability and status Nutritional status Physical activity Advanced directives List of other physicians Hospitalizations, surgeries, and ER visits in previous 12 months Vitals Screenings to include cognitive, depression, and falls Referrals and appointments  In addition, I have reviewed and discussed with patient certain preventive protocols, quality metrics, and best practice recommendations. A written personalized care plan for preventive services as well as general preventive health recommendations were provided to patient.     Wanda Plump, RN   07/18/2022   Nurse Notes:  Mr. Mccarry , Thank you for taking  time to come for your Medicare Wellness Visit. I appreciate your ongoing commitment to your health goals. Please review the following plan we discussed and let me know if I can assist you in the future.   These are the goals we discussed:  Goals      maintaining current status of mobitlity     I intend to keep going to the gym and working with my wife in our garden. Continuing social activities with my family.     Patient Stated     Maintain current health status and stay as independent as possible. Continue close relationships with all of my family members.     Patient Stated     Maintain current health status and stay as active as possible.         This is a list of the screening recommended for you and due dates:  Health Maintenance  Topic Date Due   COVID-19 Vaccine (6 - Moderna series) 09/13/2021   Zoster (Shingles) Vaccine (1 of 2) 08/29/2022*   Medicare Annual Wellness Visit  07/19/2023   Tetanus Vaccine  05/13/2029   Pneumonia Vaccine  Completed   Flu Shot  Completed   HPV Vaccine  Aged Out  *Topic was postponed. The date shown is not the original due date.

## 2022-07-17 NOTE — Patient Instructions (Signed)
Health Maintenance, Male Adopting a healthy lifestyle and getting preventive care are important in promoting health and wellness. Ask your health care provider about: The right schedule for you to have regular tests and exams. Things you can do on your own to prevent diseases and keep yourself healthy. What should I know about diet, weight, and exercise? Eat a healthy diet  Eat a diet that includes plenty of vegetables, fruits, low-fat dairy products, and lean protein. Do not eat a lot of foods that are high in solid fats, added sugars, or sodium. Maintain a healthy weight Body mass index (BMI) is a measurement that can be used to identify possible weight problems. It estimates body fat based on height and weight. Your health care provider can help determine your BMI and help you achieve or maintain a healthy weight. Get regular exercise Get regular exercise. This is one of the most important things you can do for your health. Most adults should: Exercise for at least 150 minutes each week. The exercise should increase your heart rate and make you sweat (moderate-intensity exercise). Do strengthening exercises at least twice a week. This is in addition to the moderate-intensity exercise. Spend less time sitting. Even light physical activity can be beneficial. Watch cholesterol and blood lipids Have your blood tested for lipids and cholesterol at 86 years of age, then have this test every 5 years. You may need to have your cholesterol levels checked more often if: Your lipid or cholesterol levels are high. You are older than 86 years of age. You are at high risk for heart disease. What should I know about cancer screening? Many types of cancers can be detected early and may often be prevented. Depending on your health history and family history, you may need to have cancer screening at various ages. This may include screening for: Colorectal cancer. Prostate cancer. Skin cancer. Lung  cancer. What should I know about heart disease, diabetes, and high blood pressure? Blood pressure and heart disease High blood pressure causes heart disease and increases the risk of stroke. This is more likely to develop in people who have high blood pressure readings or are overweight. Talk with your health care provider about your target blood pressure readings. Have your blood pressure checked: Every 3-5 years if you are 18-39 years of age. Every year if you are 40 years old or older. If you are between the ages of 65 and 75 and are a current or former smoker, ask your health care provider if you should have a one-time screening for abdominal aortic aneurysm (AAA). Diabetes Have regular diabetes screenings. This checks your fasting blood sugar level. Have the screening done: Once every three years after age 45 if you are at a normal weight and have a low risk for diabetes. More often and at a younger age if you are overweight or have a high risk for diabetes. What should I know about preventing infection? Hepatitis B If you have a higher risk for hepatitis B, you should be screened for this virus. Talk with your health care provider to find out if you are at risk for hepatitis B infection. Hepatitis C Blood testing is recommended for: Everyone born from 1945 through 1965. Anyone with known risk factors for hepatitis C. Sexually transmitted infections (STIs) You should be screened each year for STIs, including gonorrhea and chlamydia, if: You are sexually active and are younger than 86 years of age. You are older than 86 years of age and your   health care provider tells you that you are at risk for this type of infection. Your sexual activity has changed since you were last screened, and you are at increased risk for chlamydia or gonorrhea. Ask your health care provider if you are at risk. Ask your health care provider about whether you are at high risk for HIV. Your health care provider  may recommend a prescription medicine to help prevent HIV infection. If you choose to take medicine to prevent HIV, you should first get tested for HIV. You should then be tested every 3 months for as long as you are taking the medicine. Follow these instructions at home: Alcohol use Do not drink alcohol if your health care provider tells you not to drink. If you drink alcohol: Limit how much you have to 0-2 drinks a day. Know how much alcohol is in your drink. In the [place] one drink equals one 12 oz bottle of beer (355 mL), one 5 oz glass of Pascal Stiggers (148 mL), or one 1 oz glass of hard liquor (44 mL). Lifestyle Do not use any products that contain nicotine or tobacco. These products include cigarettes, chewing tobacco, and vaping devices, such as e-cigarettes. If you need help quitting, ask your health care provider. Do not use street drugs. Do not share needles. Ask your health care provider for help if you need support or information about quitting drugs. General instructions Schedule regular health, dental, and eye exams. Stay current with your vaccines. Tell your health care provider if: You often feel depressed. You have ever been abused or do not feel safe at home. Summary Adopting a healthy lifestyle and getting preventive care are important in promoting health and wellness. Follow your health care provider's instructions about healthy diet, exercising, and getting tested or screened for diseases. Follow your health care provider's instructions on monitoring your cholesterol and blood pressure. This information is not intended to replace advice given to you by your health care provider. Make sure you discuss any questions you have with your health care provider. Document Revised: 01/22/2021 Document Reviewed: 01/22/2021 Elsevier Patient Education  2023 Elsevier Inc.  

## 2022-07-18 ENCOUNTER — Ambulatory Visit (INDEPENDENT_AMBULATORY_CARE_PROVIDER_SITE_OTHER): Payer: Medicare Other | Admitting: *Deleted

## 2022-07-18 DIAGNOSIS — Z0000 Encounter for general adult medical examination without abnormal findings: Secondary | ICD-10-CM

## 2022-09-20 ENCOUNTER — Telehealth: Payer: Self-pay | Admitting: Cardiovascular Disease

## 2022-09-20 NOTE — Telephone Encounter (Signed)
Spoke with pt who states since starting on Furosemide 20mg  daily last year he has developed continuous runny nose, watery eyes and hoarseness. Pt has not discussed these symptoms with his PCP as he feels strongly it is from the Furosemide.  He states he can no longer tolerate these symptoms and the constant urination.  He states edema resolved shortly after starting Furosemide and has not recurred.  He would like to know if there is an alternative medication or dose he can try due to side effects.   Advised will forward to Dr Johnsie Cancel for review and further recommendations.  Pt verbalizes understanding and agrees with current plan.

## 2022-09-20 NOTE — Telephone Encounter (Signed)
Pt c/o medication issue:  1. Name of Medication:  furosemide (LASIX) 20 MG tablet  2. How are you currently taking this medication (dosage and times per day)?  Once daily in the morning  3. Are you having a reaction (difficulty breathing--STAT)?   4. What is your medication issue?    Patient states this medication has been causing difficulties "up" his body and he would like to discuss with Dr. Kyla Balzarine nurse.

## 2022-09-23 MED ORDER — TORSEMIDE 10 MG PO TABS: 10.0000 mg | ORAL_TABLET | Freq: Every day | ORAL | 3 refills | Status: DC

## 2022-09-23 MED ORDER — TORSEMIDE 10 MG PO TABS: 10.0000 mg | ORAL_TABLET | ORAL | 3 refills | Status: DC | PRN

## 2022-09-23 NOTE — Telephone Encounter (Signed)
Spoke with pt and pt's daughter, DPR, advised of Dr Kyla Balzarine recommendation to stop Furosemide 20mg  and begin Demadex 10mg  - 1 tablet by mouth as need for edema. Pt will schedule yearly appointment with Dr Johnsie Cancel for March. Pt verbalizes understanding and agrees with current plan.  Can stop lasix and use demedex 10 mg PRN for edema-PN

## 2022-09-24 ENCOUNTER — Telehealth: Payer: Self-pay | Admitting: Cardiovascular Disease

## 2022-09-24 NOTE — Telephone Encounter (Signed)
Informed the pharmacy that demadex 10 mg by mouth as needed for edema is the correct dose and frequency.

## 2022-09-24 NOTE — Telephone Encounter (Signed)
Pt c/o medication issue:  1. Name of Medication:   torsemide (DEMADEX) 10 MG tablet   2. How are you currently taking this medication (dosage and times per day)?   3. Are you having a reaction (difficulty breathing--STAT)?   4. What is your medication issue?   Caller stated he will need clarification on the instructions for this medication.

## 2022-10-03 ENCOUNTER — Telehealth: Payer: Self-pay | Admitting: Cardiovascular Disease

## 2022-10-03 NOTE — Telephone Encounter (Signed)
Pt c/o medication issue:  1. Name of Medication: torsemide (DEMADEX) 10 MG tablet   2. How are you currently taking this medication (dosage and times per day)? Not currently taking   3. Are you having a reaction (difficulty breathing--STAT)? no  4. What is your medication issue? Patient states he had a problem with swelling and was prescribed torsemide, but cannot take it. He states it made the swelling "flow up his body". He states he is not currently taking it and is having "fluid flowing" but is not having swelling. He states he cannot sleep at night and says his symptoms are difficult to explain. He states he is not having SOB or chest pain. He states he is "just having a lot of problems" He says his only phone number is: [phone], but it will probably not work. He say he will call back tomorrow for a recommendation.

## 2022-10-03 NOTE — Telephone Encounter (Signed)
Spoke with the patient who reports that he is no longer having swelling in his legs. He states that he has stopped taking torsemide. He reports a runny nose and inability to stay asleep through out the night. He states that it feels like fluid is in his chest and neck. He denies any shortness of breath or chest pain. He has a lot of difficulty describing his symptoms. I encouraged him to reach out to his primary care provider. Advised to let us know if swelling returns and if he develops any shortness of breath or chest pain.

## 2022-10-04 ENCOUNTER — Ambulatory Visit: Payer: Medicare Other | Admitting: Internal Medicine

## 2022-10-08 ENCOUNTER — Ambulatory Visit (INDEPENDENT_AMBULATORY_CARE_PROVIDER_SITE_OTHER): Payer: Medicare Other | Admitting: Internal Medicine

## 2022-10-08 ENCOUNTER — Other Ambulatory Visit: Payer: Self-pay | Admitting: Internal Medicine

## 2022-10-08 VITALS — BP 130/78 | HR 111 | Temp 98.0°F | Ht 70.0 in | Wt 175.0 lb

## 2022-10-08 DIAGNOSIS — I10 Essential (primary) hypertension: Secondary | ICD-10-CM | POA: Diagnosis not present

## 2022-10-08 DIAGNOSIS — R21 Rash and other nonspecific skin eruption: Secondary | ICD-10-CM | POA: Diagnosis not present

## 2022-10-08 DIAGNOSIS — E785 Hyperlipidemia, unspecified: Secondary | ICD-10-CM | POA: Diagnosis not present

## 2022-10-08 DIAGNOSIS — R7302 Impaired glucose tolerance (oral): Secondary | ICD-10-CM

## 2022-10-08 DIAGNOSIS — J309 Allergic rhinitis, unspecified: Secondary | ICD-10-CM

## 2022-10-08 DIAGNOSIS — R609 Edema, unspecified: Secondary | ICD-10-CM | POA: Diagnosis not present

## 2022-10-08 DIAGNOSIS — F32A Depression, unspecified: Secondary | ICD-10-CM | POA: Diagnosis not present

## 2022-10-08 DIAGNOSIS — D649 Anemia, unspecified: Secondary | ICD-10-CM

## 2022-10-08 DIAGNOSIS — E538 Deficiency of other specified B group vitamins: Secondary | ICD-10-CM

## 2022-10-08 DIAGNOSIS — T7840XD Allergy, unspecified, subsequent encounter: Secondary | ICD-10-CM

## 2022-10-08 DIAGNOSIS — Z0001 Encounter for general adult medical examination with abnormal findings: Secondary | ICD-10-CM

## 2022-10-08 DIAGNOSIS — E559 Vitamin D deficiency, unspecified: Secondary | ICD-10-CM

## 2022-10-08 LAB — BASIC METABOLIC PANEL
BUN: 17 mg/dL (ref 6–23)
CO2: 26 mEq/L (ref 19–32)
Calcium: 9.1 mg/dL (ref 8.4–10.5)
Chloride: 104 mEq/L (ref 96–112)
Creatinine, Ser: 1.18 mg/dL (ref 0.40–1.50)
GFR: 54.15 mL/min — ABNORMAL LOW (ref >60.00)
Glucose, Bld: 102 mg/dL — ABNORMAL HIGH (ref 70–99)
Potassium: 4.5 mEq/L (ref 3.5–5.1)
Sodium: 139 mEq/L (ref 135–145)

## 2022-10-08 LAB — HEPATIC FUNCTION PANEL
ALT: 18 U/L (ref 0–53)
AST: 21 U/L (ref 0–37)
Albumin: 3.8 g/dL (ref 3.5–5.2)
Alkaline Phosphatase: 67 U/L (ref 39–117)
Bilirubin, Direct: 0.2 mg/dL (ref 0.0–0.3)
Total Bilirubin: 1 mg/dL (ref 0.2–1.2)
Total Protein: 6.3 g/dL (ref 6.0–8.3)

## 2022-10-08 LAB — CBC WITH DIFFERENTIAL/PLATELET
Basophils Absolute: 0 K/uL (ref 0.0–0.1)
Basophils Relative: 0.5 % (ref 0.0–3.0)
Eosinophils Absolute: 0.2 K/uL (ref 0.0–0.7)
Eosinophils Relative: 3.9 % (ref 0.0–5.0)
HCT: 30.7 % — ABNORMAL LOW (ref 39.0–52.0)
Hemoglobin: 10.7 g/dL — ABNORMAL LOW (ref 13.0–17.0)
Lymphocytes Relative: 16.3 % (ref 12.0–46.0)
Lymphs Abs: 0.8 K/uL (ref 0.7–4.0)
MCHC: 34.7 g/dL (ref 30.0–36.0)
MCV: 110.2 fl — ABNORMAL HIGH (ref 78.0–100.0)
Monocytes Absolute: 0.6 K/uL (ref 0.1–1.0)
Monocytes Relative: 12.5 % — ABNORMAL HIGH (ref 3.0–12.0)
Neutro Abs: 3.4 K/uL (ref 1.4–7.7)
Neutrophils Relative %: 66.8 % (ref 43.0–77.0)
Platelets: 312 K/uL (ref 150.0–400.0)
RBC: 2.79 Mil/uL — ABNORMAL LOW (ref 4.22–5.81)
RDW: 14.1 % (ref 11.5–15.5)
WBC: 5 K/uL (ref 4.0–10.5)

## 2022-10-08 LAB — URINALYSIS, ROUTINE W REFLEX MICROSCOPIC
Bilirubin Urine: NEGATIVE
Hgb urine dipstick: NEGATIVE
Ketones, ur: NEGATIVE
Leukocytes,Ua: NEGATIVE
Nitrite: NEGATIVE
RBC / HPF: NONE SEEN (ref 0–?)
Specific Gravity, Urine: 1.015 (ref 1.000–1.030)
Total Protein, Urine: NEGATIVE
Urine Glucose: NEGATIVE
Urobilinogen, UA: 0.2 (ref 0.0–1.0)
pH: 5.5 (ref 5.0–8.0)

## 2022-10-08 LAB — LIPID PANEL
Cholesterol: 134 mg/dL (ref 0–200)
HDL: 61.5 mg/dL (ref >39.00)
LDL Cholesterol: 62 mg/dL (ref 0–99)
NonHDL: 72.92
Total CHOL/HDL Ratio: 2
Triglycerides: 56 mg/dL (ref 0.0–149.0)
VLDL: 11.2 mg/dL (ref 0.0–40.0)

## 2022-10-08 LAB — VITAMIN D 25 HYDROXY (VIT D DEFICIENCY, FRACTURES): VITD: 55.94 ng/mL (ref 30.00–100.00)

## 2022-10-08 LAB — HEMOGLOBIN A1C: Hgb A1c MFr Bld: 6.5 % (ref 4.6–6.5)

## 2022-10-08 LAB — VITAMIN B12: Vitamin B-12: 403 pg/mL (ref 211–911)

## 2022-10-08 LAB — TSH: TSH: 3.82 u[IU]/mL (ref 0.35–5.50)

## 2022-10-08 MED ORDER — LEVOCETIRIZINE DIHYDROCHLORIDE 5 MG PO TABS: 5.0000 mg | ORAL_TABLET | Freq: Every day | ORAL | 3 refills | Status: DC | PRN

## 2022-10-08 MED ORDER — CLOTRIMAZOLE-BETAMETHASONE 1-0.05 % EX CREA: TOPICAL_CREAM | CUTANEOUS | 1 refills | Status: DC

## 2022-10-08 MED ORDER — CITALOPRAM HYDROBROMIDE 10 MG PO TABS: 10.0000 mg | ORAL_TABLET | Freq: Every day | ORAL | 3 refills | Status: DC

## 2022-10-08 MED ORDER — TRIAMCINOLONE ACETONIDE 55 MCG/ACT NA AERO: 2.0000 | INHALATION_SPRAY | Freq: Every day | NASAL | 12 refills | Status: AC

## 2022-10-08 NOTE — Progress Notes (Unsigned)
Patient ID: RAS KOLLMAN, male   DOB: 11-23-1929, 89 y.o.   MRN: [id number]         Chief Complaint:: wellness exam and Urinary frequency (Going a lot at night)  , eye and nasal allergy symptoms, feet swelling, rash left distal leg, depression       HPI:  Ronald [name] is a 89 y.o. male here for wellness exam with wife present as he has memory issue; pt may have shingrix at the pharmacy, delcines covid booster o/w up to date  Also Pt main c/o of increased urination only on days of taking the torsemide 10 mg prn has difficulty getting to the BR so many times, especially at night.  Has helped the swelling however, still has some mild swelling today.  Denies urinary symptoms such as dysuria, frequency, urgency, flank pain, hematuria or n/v, fever, chills.  Pt denies chest pain, increased sob or doe, wheezing, orthopnea, PND, increased LE swelling, palpitations, dizziness or syncope.   Pt denies polydipsia, polyuria, or new focal neuro s/s.   Does have rash to left distal leg nontender but scaly erythema.  Pt and wife endosre mild to mod worsening depressive symptoms,but no suicidal ideation, or pani.  Does also have several wks ongoing eye and nasal allergy symptoms with clearish congestion, itch and sneezing, without fever, pain, ST, cough, swelling or wheezing.   Wt Readings from Last 3 Encounters:  10/08/22 175 lb (79.4 kg)  05/30/22 171 lb (77.6 kg)  12/11/21 174 lb (78.9 kg)   BP Readings from Last 3 Encounters:  10/08/22 130/78  05/30/22 122/70  01/16/22 (!) 161/71   Immunization History  Administered Date(s) Administered   Fluad Quad(high Dose 65+) 05/14/2019, 05/29/2021, 05/30/2022   H1N1 06/16/2008   Influenza Split 05/28/2013   Influenza, High Dose Seasonal PF 06/23/2017, 06/16/2018, 08/05/2018   Influenza,inj,Quad PF,6+ Mos 06/21/2014, 05/07/2016   Influenza-Unspecified 06/15/2015   Moderna Sars-Covid-2 Vaccination 11/09/2019, 11/30/2019   PFIZER(Purple Top)SARS-COV-2 Vaccination  06/10/2020, 02/28/2021, 07/19/2021   Pneumococcal Conjugate-13 04/28/2014   Pneumococcal Polysaccharide-23 09/16/2004, 04/16/2011   Td 01/04/2009   Tdap 05/14/2019   Zoster, Live 08/17/2013   Health Maintenance Due  Topic Date Due   Zoster Vaccines- Shingrix (1 of 2) Never done   COVID-19 Vaccine (6 - 2023-24 season) 05/17/2022      Past Medical History:  Diagnosis Date   ALLERGIC RHINITIS 11/25/2007   ANXIETY 11/25/2007   COLONIC POLYPS, HX OF 11/25/2007   CORONARY ARTERY DISEASE 11/25/2007   Cough 01/04/2009   HEMORRHOIDS, INTERNAL 11/06/2010   HOARSENESS 01/04/2009   HYPERLIPIDEMIA 11/25/2007   HYPERTENSION 11/25/2007   Impaired glucose tolerance 04/21/2013   LOW BACK PAIN 11/25/2007   Lumbar radiculopathy, chronic    with right foot drop   PEPTIC ULCER DISEASE 11/25/2007   Peroneal nerve lesion of lower extremity, 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 He has never used smokeless tobacco. He reports that he does not currently use alcohol. He reports that he does not use drugs. family history includes Colon cancer in his father. Allergies  Allergen  Reactions   Aricept [Donepezil] Other (See Comments)    Crazy dreams   Current Outpatient Medications on File Prior to Visit  Medication Sig Dispense Refill   aspirin 81 MG tablet Take 81 mg by mouth daily.     atorvastatin (LIPITOR) 40 MG tablet Take 1 tablet (40 mg total) by mouth every evening. 90 tablet 3   Cholecalciferol (VITAMIN D) 2000 UNITS CAPS Take 2,000 Units by mouth daily.     Coenzyme Q10 (COQ10 PO) Take 1 tablet by mouth daily.      donepezil (ARICEPT) 10 MG tablet Take 1 tablet (10 mg  total) by mouth at bedtime. 90 tablet 3   propranolol ER (INDERAL LA) 60 MG 24 hr capsule Take 1 capsule (60 mg total) by mouth daily. 90 capsule 3   telmisartan (MICARDIS) 40 MG tablet Take 1 tablet (40 mg total) by mouth daily. 90 tablet 3   torsemide (DEMADEX) 10 MG tablet Take 1 tablet (10 mg total) by mouth as needed. 30 tablet 3   triamcinolone cream (KENALOG) 0.1 % Apply topically 2 (two) times daily.     b complex vitamins tablet Take 1 tablet by mouth daily. (Patient not taking: Reported on 07/18/2022)     OMEGA 3 1200 MG CAPS Take 1 capsule by mouth 2 (two) times daily.  (Patient not taking: Reported on 07/18/2022)     No current facility-administered medications on file prior to visit.        ROS:  All others reviewed and negative.  Objective        PE:  BP 130/78 (BP Location: Left Arm, Patient Position: Sitting, Cuff Size: Large)   Pulse (!) 111   Temp 98 F (36.7 C) (Oral)   Ht 5\' 10"  (1.778 m)   Wt 175 lb (79.4 kg)   SpO2 98%   BMI 25.11 kg/m                 Constitutional: Pt appears in NAD               HENT: Head: NCAT.                Right Ear: External ear normal.                 Left Ear: External ear normal.                Eyes: . Pupils are equal, round, and reactive to light. Conjunctivae and EOM are normal               Nose: without d/c or deformity               Neck: Neck supple. Gross normal ROM               Cardiovascular: Normal rate and regular rhythm.                 Pulmonary/Chest: Effort normal and breath sounds without rales or wheezing.                Abd:  Soft, NT, ND, + BS, no organomegaly               Neurological: Pt is alert. At baseline orientation, motor grossly intact               Skin: Skin is warm.  LE edema - trace pedal bilateral, left lateral distal leg with 4 x4 cm area scaly nontender erythema  Psychiatric: Pt behavior is normal without agitation   Micro: none  Cardiac tracings I have personally interpreted  today:  none  Pertinent Radiological findings (summarize): none   Lab Results  Component Value Date   WBC 5.0 10/08/2022   HGB 10.7 (L) 10/08/2022   HCT 30.7 (L) 10/08/2022   PLT 312.0 10/08/2022   GLUCOSE 102 (H) 10/08/2022   CHOL 134 10/08/2022   TRIG 56.0 10/08/2022   HDL 61.50 10/08/2022   LDLCALC 62 10/08/2022   ALT 18 10/08/2022   AST 21 10/08/2022   NA 139 10/08/2022   K 4.5 10/08/2022   CL 104 10/08/2022   CREATININE 1.18 10/08/2022   BUN 17 10/08/2022   CO2 26 10/08/2022   TSH 3.82 10/08/2022   PSA 2.00 01/25/2010   HGBA1C 6.5 10/08/2022   Assessment/Plan:  Ronald [name] is a 89 y.o. White or Caucasian [1] male with  has a past medical history of ALLERGIC RHINITIS (11/25/2007), ANXIETY (11/25/2007), COLONIC POLYPS, HX OF (11/25/2007), CORONARY ARTERY DISEASE (11/25/2007), Cough (01/04/2009), HEMORRHOIDS, INTERNAL (11/06/2010), HOARSENESS (01/04/2009), HYPERLIPIDEMIA (11/25/2007), HYPERTENSION (11/25/2007), Impaired glucose tolerance (04/21/2013), LOW BACK PAIN (11/25/2007), Lumbar radiculopathy, chronic, PEPTIC ULCER DISEASE (11/25/2007), Peroneal nerve lesion of lower extremity, bilateral (05/08/2017), Preventative health care (04/07/2011), PSA, INCREASED (01/05/2009), Right foot drop, Spasmodic dysphonia, and TREMOR, ESSENTIAL (11/25/2007).  Encounter for well adult exam with abnormal findings Age and sex appropriate education and counseling updated with regular exercise and diet Referrals for preventative services - none needed Immunizations addressed - for shingrix and covid booster at the pharmacy Smoking counseling  - none needed Evidence for depression or other mood disorder - new recent onset worsening depression - for celexa 10 mg qd Most recent labs reviewed. I have personally reviewed and have noted: 1) the patient's medical and social history 2) The patient's current medications and supplements 3) The patient's height, weight, and BMI have been recorded in the  chart   Depression Mild to mod worsening, for celexa 10 mg qd, decliens referral for counseling  Elevated lipids Lab Results  Component Value Date   LDLCALC 62 10/08/2022   Stable, pt to continue current statin lipitor 40 mg qd   Essential hypertension BP Readings from Last 3 Encounters:  10/08/22 130/78  05/30/22 122/70  01/16/22 (!) 161/71   Stable, pt to continue medical treatment micardis 40 mg qd, inderal LA 60 mg qd    Impaired glucose tolerance Lab Results  Component Value Date   HGBA1C 6.5 10/08/2022   Stable, pt to continue current medical treatment  - diet, wt control   Peripheral edema Improved, pt encouraged for contd torsemide 10 mg prn, and keep urinarl close by to avoid going to BR so many times  Rash Left lateral distal leg - for lotrisone cr prn  Allergies Mild worsening recently, for add OTC Nasacort asd prn  Followup: Return in about 6 months (around 04/08/2023).  Cathlean Cower, MD 10/10/2022 4:56 AM [facility]

## 2022-10-08 NOTE — Patient Instructions (Addendum)
Please have your Shingrix (shingles) shots done at your local pharmacy.  Please take all new medication as prescribed - the xyzal for allergies, and Nasacort for allergies  Please take all new medication as prescribed - the cream for the left leg area, but call if not better in a 1 week if not improved for dermatology referral  Please take all new medication as prescribed - the celexa 10 mg per day  Please consider keeping a Urinal close by on the days you take the fluid pill so you dont have to rush to the Bathroom so much  Please continue all other medications as before, and refills have been done if requested.  Please have the pharmacy call with any other refills you may need.  Please continue your efforts at being more active, low cholesterol diet, and weight control.  You are otherwise up to date with prevention measures today.  Please keep your appointments with your specialists as you may have planned  Please go to the LAB at the blood drawing area for the tests to be done  You will be contacted by phone if any changes need to be made immediately.  Otherwise, you will receive a letter about your results with an explanation, but please check with MyChart first.  Please remember to sign up for MyChart if you have not done so, as this will be important to you in the future with finding out test results, communicating by private email, and scheduling acute appointments online when needed.  Please make an Appointment to return in 6 months, or sooner if needed

## 2022-10-10 ENCOUNTER — Encounter: Payer: Self-pay | Admitting: Internal Medicine

## 2022-10-10 DIAGNOSIS — T7840XA Allergy, unspecified, initial encounter: Secondary | ICD-10-CM | POA: Insufficient documentation

## 2022-10-10 NOTE — Assessment & Plan Note (Signed)
Mild worsening recently, for add OTC Nasacort asd prn

## 2022-10-10 NOTE — Assessment & Plan Note (Signed)
Lab Results  Component Value Date   HGBA1C 6.5 10/08/2022   Stable, pt to continue current medical treatment  - diet, wt control

## 2022-10-10 NOTE — Assessment & Plan Note (Signed)
Lab Results  Component Value Date   LDLCALC 62 10/08/2022   Stable, pt to continue current statin lipitor 40 mg qd

## 2022-10-10 NOTE — Assessment & Plan Note (Signed)
Improved, pt encouraged for contd torsemide 10 mg prn, and keep urinarl close by to avoid going to BR so many times

## 2022-10-10 NOTE — Assessment & Plan Note (Signed)
Age and sex appropriate education and counseling updated with regular exercise and diet Referrals for preventative services - none needed Immunizations addressed - for shingrix and covid booster at the pharmacy Smoking counseling  - none needed Evidence for depression or other mood disorder - new recent onset worsening depression - for celexa 10 mg qd Most recent labs reviewed. I have personally reviewed and have noted: 1) the patient's medical and social history 2) The patient's current medications and supplements 3) The patient's height, weight, and BMI have been recorded in the chart

## 2022-10-10 NOTE — Assessment & Plan Note (Signed)
BP Readings from Last 3 Encounters:  10/08/22 130/78  05/30/22 122/70  01/16/22 (!) 161/71   Stable, pt to continue medical treatment micardis 40 mg qd, inderal LA 60 mg qd

## 2022-10-10 NOTE — Assessment & Plan Note (Signed)
Mild to mod worsening, for celexa 10 mg qd, decliens referral for counseling

## 2022-10-10 NOTE — Assessment & Plan Note (Signed)
Left lateral distal leg - for lotrisone cr prn

## 2022-11-01 NOTE — Progress Notes (Signed)
Date:  11/13/2022   ID:  Ronald [name], DOB 08-Dec-1929, MRN [id number]  Provider Location: Office  PCP:  Ronald Borg, MD  Cardiologist:   Ronald [name] Electrophysiologist:  None   Evaluation Performed:  Follow-Up Visit  Chief Complaint:  CAD/CABG  History of Present Illness:    89 y.o. history of CABG 1998 Cath 2003 with occluded SVG RCA Rx medically Activity limited by bilateral foot drop. No angina. Wife is battling lung cancer. Uses lasix for LE edema. 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  Still using his Risk analyst and exercising a couple of hours / day Seeing Ronald Ronald Mac DO for right hand trigger finger Wife has had lung cancer for 10 years on [place]   No angina  LDL at goal on statin 54 11/27/21   Has senile purura/bruising with poor balance using cane No falls PLT ok at 57   Sees Ronald [name] for tremor and memory changes on Inderal and declined cognitive testing   Doing very well with no angina Wife's health is very bad Has lung cancer and full time care has a son Ronald [name] who is a GP in [place] His two other sons have a 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 in [place]  Has had GI incontinence for last few weeks which is very bothersome to him Has seen GI and stool sample pending Tremors worse with TIC like movements on face    Past Medical History:  Diagnosis Date   ALLERGIC RHINITIS 11/25/2007   ANXIETY 11/25/2007   COLONIC POLYPS, HX OF 11/25/2007   CORONARY ARTERY DISEASE 11/25/2007   Cough 01/04/2009   HEMORRHOIDS, INTERNAL 11/06/2010   HOARSENESS 01/04/2009   HYPERLIPIDEMIA 11/25/2007   HYPERTENSION 11/25/2007   Impaired glucose tolerance 04/21/2013   LOW BACK PAIN 11/25/2007   Lumbar radiculopathy, chronic    with right foot drop   PEPTIC ULCER DISEASE 11/25/2007   Peroneal nerve lesion of lower extremity, 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   atorvastatin (LIPITOR) 40 MG tablet Take 1 tablet (40 mg total) by mouth every evening.   b complex vitamins tablet Take 1 tablet by mouth daily.   Cholecalciferol (VITAMIN D) 2000 UNITS CAPS Take 2,000 Units by mouth daily.   citalopram (CELEXA) 10 MG tablet Take 1 tablet (10 mg total) by mouth daily.   clotrimazole-betamethasone (LOTRISONE) cream Use as directed twice per day as needed   Coenzyme Q10 (COQ10 PO) Take 1 tablet by mouth daily.    donepezil (ARICEPT) 10 MG tablet Take 1 tablet (10 mg total) by mouth at bedtime.   levocetirizine (XYZAL) 5 MG tablet Take 1 tablet (5 mg total) by mouth daily as needed for allergies.   OMEGA 3 1200 MG CAPS Take 1 capsule by mouth 2 (two) times daily.   propranolol ER (INDERAL LA) 60 MG 24 hr capsule Take 1 capsule (60 mg total) by mouth daily.   telmisartan (MICARDIS) 40 MG  tablet Take 1 tablet (40 mg total) by mouth daily.   torsemide (DEMADEX) 10 MG tablet Take 1 tablet (10 mg total) by mouth as needed.   triamcinolone (NASACORT) 55 MCG/ACT AERO nasal inhaler Place 2 sprays into the nose daily.   triamcinolone cream (KENALOG) 0.1 % Apply topically 2 (two) times daily.     Allergies:   Aricept [donepezil]   Social History   Tobacco Use   Smoking status: Never   Smokeless tobacco: Never  Vaping Use   Vaping Use: Never used  Substance Use Topics   Alcohol use: Not Currently    Comment: Occ   Drug use: No     Family Hx: The patient's family history includes Colon cancer in his father.  ROS:   Please see the history of present illness.     All other systems reviewed and are negative.   Prior CV studies:   The  following studies were reviewed today:  Myvue 2008 Carotid 2012   Labs/Other Tests and Data Reviewed:    EKG:  SR LAD / RBBB rate 57  no acute changes 11/13/2022   Recent Labs: 10/08/2022: ALT 18; BUN 17; Creatinine, Ser 1.18; Hemoglobin 10.7; Platelets 312.0; Potassium 4.5; Sodium 139; TSH 3.82   Recent Lipid Panel Lab Results  Component Value Date/Time   CHOL 134 10/08/2022 10:54 AM   TRIG 56.0 10/08/2022 10:54 AM   HDL 61.50 10/08/2022 10:54 AM   CHOLHDL 2 10/08/2022 10:54 AM   LDLCALC 62 10/08/2022 10:54 AM    Wt Readings from Last 3 Encounters:  11/13/22 172 lb (78 kg)  11/11/22 174 lb (78.9 kg)  10/08/22 175 lb (79.4 kg)     Objective:    Vital Signs:  BP (!) 152/62   Pulse (!) 56   Ht '5\' 10"'$  (1.778 m)   Wt 172 lb (78 kg)   SpO2 96%   BMI 24.68 kg/m    Affect appropriate Healthy:  appears stated age 65: normal Neck supple with no adenopathy JVP normal no bruits no thyromegaly Lungs clear with no wheezing and good diaphragmatic motion Heart:  S1/S2 no murmur, no rub, gallop or click PMI normal post sternotomy  Abdomen: benighn, BS positve, no tenderness, no AAA no bruit.  No HSM or HJR Distal pulses intact with no bruits Trace LE  edema Neuro non-focal head tremor in yes direction with TICS Skin warm and dry Chronic right foot drop  Right middle finger trigger finger    ASSESSMENT & PLAN:    1. CAD/CABG:  CABG 98 cath 2003 occluded RCA graft.  Myovue 2008 non ischemic Active working out at gym everyday no symptoms continue medical Rx stable   2. HTN: Well controlled.  Continue current medications and low sodium Dash type diet.  Inderal/Micardis    3. Chol:  - at goal LDL 61 11/16/19 continue statin   4. Bifasicular block:  stable no high grade AV block yearly ECG   5. Edema:  Dependant continue lasix low sodium diet labs with primary   6. Ortho:  F/u with Ronald Tamala Julian for injured left shoulder still with some restriction to motion    7. Neuro:   F/u Ronald [name] tremor and right foot drop with some cognitive impairment   8. GI:  explosive urge incontinency ? Infection f/u GI   COVID-19 Education: The signs and symptoms of COVID-19 were discussed with the patient and how to seek care for testing (follow up with PCP or arrange E-visit).  The importance of social distancing was discussed today.   Medication Adjustments/Labs and Tests Ordered: Current medicines are reviewed at length with the patient today.  Concerns regarding medicines are outlined above.   Tests Ordered:  None   Medication Changes:  None   Disposition:  Follow up  in a year  Signed, Jenkins Rouge, MD  11/13/2022 11:25 AM    [place]

## 2022-11-06 DIAGNOSIS — R197 Diarrhea, unspecified: Secondary | ICD-10-CM | POA: Insufficient documentation

## 2022-11-11 ENCOUNTER — Encounter: Payer: Self-pay | Admitting: Family Medicine

## 2022-11-11 ENCOUNTER — Ambulatory Visit: Payer: Medicare Other | Admitting: Family Medicine

## 2022-11-11 ENCOUNTER — Ambulatory Visit (INDEPENDENT_AMBULATORY_CARE_PROVIDER_SITE_OTHER): Payer: Medicare Other | Admitting: Family Medicine

## 2022-11-11 VITALS — BP 138/66 | HR 64 | Temp 97.5°F | Resp 20 | Ht 70.0 in | Wt 174.0 lb

## 2022-11-11 DIAGNOSIS — R159 Full incontinence of feces: Secondary | ICD-10-CM | POA: Diagnosis not present

## 2022-11-11 DIAGNOSIS — R194 Change in bowel habit: Secondary | ICD-10-CM | POA: Diagnosis not present

## 2022-11-11 DIAGNOSIS — R32 Unspecified urinary incontinence: Secondary | ICD-10-CM | POA: Diagnosis not present

## 2022-11-11 NOTE — Progress Notes (Signed)
Assessment & Plan:  1-2. Increased bowel frequency/Bowel and bladder incontinence Encouraged him to increase his fiber or take Metamucil to bulk up his stools.  Stool testing to rule out infection.  Advised against taking Imodium A-D due to the possibility of an infection.  Encouraged adequate hydration, specifically with increasing frequency of bowel movements. - Ova and parasite examination; Future - Stool culture; Future - Clostridium difficile EIA; Future   Follow up plan: Return if symptoms worsen or fail to improve.  Hendricks Limes, MSN, APRN, FNP-C  Subjective:  HPI: Ronald [name] is a 89 y.o. male presenting on 11/11/2022 for Incontinence (Bowel loss of control - large amt over the night, last night. Had a BM about 6 times throughout the night. )  Patient is accompanied by his wife, who he is okay with being present. Patient reports a change in stool habits that started minimum of 1-2 weeks ago. He has been having bowel movements during the night that wake him up. States if he does not get up immediately, he will have stool in his underpants.  Last night he started having a bowel movement every hour and a half.  States he had 6 bowel movements during the night 3 more today.  His last bowel movement was an hour ago.  He states every third bowel movement is large, formed, and dramatic but the ones in between are loose.  All of them require him to go to the bathroom immediately.  Denies any fever, abdominal pain, nausea, and vomiting.  He has not traveled recently, been admitted to the hospital, or visited a nursing facility.  States his diet has not changed over the years and he drinks plenty of fluids.  Reports he has bladder incontinence as well which he is taking a fluid pill.   ROS: Negative unless specifically indicated above in HPI.   Relevant past medical history reviewed and updated as indicated.   Allergies and medications reviewed and updated.   Current Outpatient  Medications:    aspirin 81 MG tablet, Take 81 mg by mouth daily., Disp: , Rfl:    atorvastatin (LIPITOR) 40 MG tablet, Take 1 tablet (40 mg total) by mouth every evening., Disp: 90 tablet, Rfl: 3   b complex vitamins tablet, Take 1 tablet by mouth daily., Disp: , Rfl:    Cholecalciferol (VITAMIN D) 2000 UNITS CAPS, Take 2,000 Units by mouth daily., Disp: , Rfl:    citalopram (CELEXA) 10 MG tablet, Take 1 tablet (10 mg total) by mouth daily., Disp: 90 tablet, Rfl: 3   Coenzyme Q10 (COQ10 PO), Take 1 tablet by mouth daily. , Disp: , Rfl:    donepezil (ARICEPT) 10 MG tablet, Take 1 tablet (10 mg total) by mouth at bedtime., Disp: 90 tablet, Rfl: 3   levocetirizine (XYZAL) 5 MG tablet, Take 1 tablet (5 mg total) by mouth daily as needed for allergies., Disp: 90 tablet, Rfl: 3   OMEGA 3 1200 MG CAPS, Take 1 capsule by mouth 2 (two) times daily., Disp: , Rfl:    propranolol ER (INDERAL LA) 60 MG 24 hr capsule, Take 1 capsule (60 mg total) by mouth daily., Disp: 90 capsule, Rfl: 3   telmisartan (MICARDIS) 40 MG tablet, Take 1 tablet (40 mg total) by mouth daily., Disp: 90 tablet, Rfl: 3   torsemide (DEMADEX) 10 MG tablet, Take 1 tablet (10 mg total) by mouth as needed., Disp: 30 tablet, Rfl: 3   triamcinolone (NASACORT) 55 MCG/ACT AERO nasal inhaler, Place  2 sprays into the nose daily., Disp: 1 each, Rfl: 12   triamcinolone cream (KENALOG) 0.1 %, Apply topically 2 (two) times daily., Disp: , Rfl:    clotrimazole-betamethasone (LOTRISONE) cream, Use as directed twice per day as needed (Patient not taking: Reported on 11/11/2022), Disp: 45 g, Rfl: 1  Allergies  Allergen Reactions   Aricept [Donepezil] Other (See Comments)    Crazy dreams    Objective:   BP 138/66   Pulse 64   Temp (!) 97.5 F (36.4 C)   Resp 20   Ht '5\' 10"'$  (1.778 m)   Wt 174 lb (78.9 kg)   BMI 24.97 kg/m    Physical Exam Vitals reviewed.  Constitutional:      General: He is not in acute distress.    Appearance: Normal  appearance. He is not ill-appearing, toxic-appearing or diaphoretic.  HENT:     Head: Normocephalic and atraumatic.  Eyes:     General: No scleral icterus.       Right eye: No discharge.        Left eye: No discharge.     Conjunctiva/sclera: Conjunctivae normal.  Cardiovascular:     Rate and Rhythm: Normal rate.  Pulmonary:     Effort: Pulmonary effort is normal. No respiratory distress.  Abdominal:     General: Abdomen is flat. Bowel sounds are normal. There is no distension.     Palpations: Abdomen is soft. There is no mass.     Tenderness: There is no abdominal tenderness. There is no guarding or rebound.     Hernia: No hernia is present.  Musculoskeletal:        General: Normal range of motion.     Cervical back: Normal range of motion.  Skin:    General: Skin is warm and dry.  Neurological:     Mental Status: He is alert and oriented to person, place, and time. Mental status is at baseline.  Psychiatric:        Mood and Affect: Mood normal.        Behavior: Behavior normal.        Thought Content: Thought content normal.        Judgment: Judgment normal.

## 2022-11-12 ENCOUNTER — Other Ambulatory Visit: Payer: Self-pay | Admitting: Family Medicine

## 2022-11-12 DIAGNOSIS — R194 Change in bowel habit: Secondary | ICD-10-CM | POA: Diagnosis not present

## 2022-11-13 ENCOUNTER — Ambulatory Visit: Payer: Medicare Other | Attending: Cardiovascular Disease | Admitting: Cardiovascular Disease

## 2022-11-13 ENCOUNTER — Encounter: Payer: Self-pay | Admitting: Cardiovascular Disease

## 2022-11-13 VITALS — BP 152/62 | HR 56 | Ht 70.0 in | Wt 172.0 lb

## 2022-11-13 DIAGNOSIS — Z951 Presence of aortocoronary bypass graft: Secondary | ICD-10-CM

## 2022-11-13 DIAGNOSIS — E782 Mixed hyperlipidemia: Secondary | ICD-10-CM

## 2022-11-13 DIAGNOSIS — I25810 Atherosclerosis of coronary artery bypass graft(s) without angina pectoris: Secondary | ICD-10-CM

## 2022-11-13 DIAGNOSIS — I452 Bifascicular block: Secondary | ICD-10-CM | POA: Diagnosis not present

## 2022-11-13 DIAGNOSIS — R251 Tremor, unspecified: Secondary | ICD-10-CM | POA: Diagnosis not present

## 2022-11-13 NOTE — Patient Instructions (Signed)
Medication Instructions:  Your physician recommends that you continue on your current medications as directed. Please refer to the Current Medication list given to you today.  *If you need a refill on your cardiac medications before your next appointment, please call your pharmacy*  Lab Work: If you have labs (blood work) drawn today and your tests are completely normal, you will receive your results only by: [place] (if you have MyChart) OR A paper copy in the mail If you have any lab test that is abnormal or we need to change your treatment, we will call you to review the results.  Testing/Procedures: None ordered today.  Follow-Up: At [facility], you and your health needs are our priority.  As part of our continuing mission to provide you with exceptional heart care, we have created designated Provider Care Teams.  These Care Teams include your primary Cardiologist (physician) and Advanced Practice Providers (APPs -  Physician Assistants and Nurse Practitioners) who all work together to provide you with the care you need, when you need it.  We recommend signing up for the patient portal called "MyChart".  Sign up information is provided on this After Visit Summary.  MyChart is used to connect with patients for Virtual Visits (Telemedicine).  Patients are able to view lab/test results, encounter notes, upcoming appointments, etc.  Non-urgent messages can be sent to your provider as well.   To learn more about what you can do with MyChart, go to [email or web address].    Your next appointment:   1 year(s)  Provider:   Jenkins Rouge, MD

## 2022-11-14 LAB — CLOSTRIDIUM DIFFICILE EIA: C difficile Toxins A+B, EIA: NEGATIVE

## 2022-11-17 LAB — STOOL CULTURE: E coli, Shiga toxin Assay: NEGATIVE

## 2022-11-22 LAB — SPECIMEN STATUS REPORT

## 2022-11-28 ENCOUNTER — Ambulatory Visit (INDEPENDENT_AMBULATORY_CARE_PROVIDER_SITE_OTHER): Payer: Medicare Other | Admitting: Internal Medicine

## 2022-11-28 VITALS — BP 122/64 | HR 64 | Temp 97.8°F | Ht 70.0 in | Wt 173.0 lb

## 2022-11-28 DIAGNOSIS — E785 Hyperlipidemia, unspecified: Secondary | ICD-10-CM | POA: Diagnosis not present

## 2022-11-28 DIAGNOSIS — I10 Essential (primary) hypertension: Secondary | ICD-10-CM | POA: Diagnosis not present

## 2022-11-28 DIAGNOSIS — F32A Depression, unspecified: Secondary | ICD-10-CM

## 2022-11-28 DIAGNOSIS — D649 Anemia, unspecified: Secondary | ICD-10-CM

## 2022-11-28 LAB — CBC WITH DIFFERENTIAL/PLATELET
Basophils Absolute: 0 10*3/uL (ref 0.0–0.1)
Basophils Relative: 0.7 % (ref 0.0–3.0)
Eosinophils Absolute: 0.2 10*3/uL (ref 0.0–0.7)
Eosinophils Relative: 6 % — ABNORMAL HIGH (ref 0.0–5.0)
HCT: 30.9 % — ABNORMAL LOW (ref 39.0–52.0)
Hemoglobin: 10.8 g/dL — ABNORMAL LOW (ref 13.0–17.0)
Lymphocytes Relative: 20.9 % (ref 12.0–46.0)
Lymphs Abs: 0.9 10*3/uL (ref 0.7–4.0)
MCHC: 34.9 g/dL (ref 30.0–36.0)
MCV: 109.7 fl — ABNORMAL HIGH (ref 78.0–100.0)
Monocytes Absolute: 0.7 10*3/uL (ref 0.1–1.0)
Monocytes Relative: 17.5 % — ABNORMAL HIGH (ref 3.0–12.0)
Neutro Abs: 2.2 10*3/uL (ref 1.4–7.7)
Neutrophils Relative %: 54.9 % (ref 43.0–77.0)
Platelets: 238 10*3/uL (ref 150.0–400.0)
RBC: 2.81 Mil/uL — ABNORMAL LOW (ref 4.22–5.81)
RDW: 14.3 % (ref 11.5–15.5)
WBC: 4.1 10*3/uL (ref 4.0–10.5)

## 2022-11-28 LAB — FERRITIN: Ferritin: 251.4 ng/mL (ref 22.0–322.0)

## 2022-11-28 LAB — BASIC METABOLIC PANEL
BUN: 17 mg/dL (ref 6–23)
CO2: 27 mEq/L (ref 19–32)
Calcium: 9 mg/dL (ref 8.4–10.5)
Chloride: 103 mEq/L (ref 96–112)
Creatinine, Ser: 1.15 mg/dL (ref 0.40–1.50)
GFR: 55.8 mL/min — ABNORMAL LOW (ref >60.00)
Glucose, Bld: 116 mg/dL — ABNORMAL HIGH (ref 70–99)
Potassium: 4.4 mEq/L (ref 3.5–5.1)
Sodium: 136 mEq/L (ref 135–145)

## 2022-11-28 LAB — HEPATIC FUNCTION PANEL
ALT: 18 U/L (ref 0–53)
AST: 22 U/L (ref 0–37)
Albumin: 3.7 g/dL (ref 3.5–5.2)
Alkaline Phosphatase: 60 U/L (ref 39–117)
Bilirubin, Direct: 0.2 mg/dL (ref 0.0–0.3)
Total Bilirubin: 1 mg/dL (ref 0.2–1.2)
Total Protein: 6.3 g/dL (ref 6.0–8.3)

## 2022-11-28 LAB — IBC PANEL
Iron: 74 ug/dL (ref 42–165)
Saturation Ratios: 30.9 % (ref 20.0–50.0)
TIBC: 239.4 ug/dL — ABNORMAL LOW (ref 250.0–450.0)
Transferrin: 171 mg/dL — ABNORMAL LOW (ref 212.0–360.0)

## 2022-11-28 MED ORDER — CITALOPRAM HYDROBROMIDE 10 MG PO TABS: 10.0000 mg | ORAL_TABLET | Freq: Every day | ORAL | 3 refills | Status: DC

## 2022-11-28 NOTE — Progress Notes (Signed)
Patient ID: Ronald [name], male   DOB: June 21, 1930, 89 y.o.   MRN: [id number]        Chief Complaint: follow up HTN, HLD, anemia, depression       HPI:  Ronald [name] is a 89 y.o. male here with family for support, overall doing ok, but has had mild worsening depressive symptoms, but no suicidal ideation, or panic; Pt denies chest pain, increased sob or doe, wheezing, orthopnea, PND, increased LE swelling, palpitations, dizziness or syncope.   Pt denies polydipsia, polyuria, or new focal neuro s/s.    Pt denies fever, wt loss, night sweats, loss of appetite, or other constitutional symptoms  No recent overt bleeding, bruising or falls.         Wt Readings from Last 3 Encounters:  11/28/22 173 lb (78.5 kg)  11/13/22 172 lb (78 kg)  11/11/22 174 lb (78.9 kg)   BP Readings from Last 3 Encounters:  11/28/22 122/64  11/13/22 (!) 152/62  11/11/22 138/66         Past Medical History:  Diagnosis Date   ALLERGIC RHINITIS 11/25/2007   ANXIETY 11/25/2007   COLONIC POLYPS, HX OF 11/25/2007   CORONARY ARTERY DISEASE 11/25/2007   Cough 01/04/2009   HEMORRHOIDS, INTERNAL 11/06/2010   HOARSENESS 01/04/2009   HYPERLIPIDEMIA 11/25/2007   HYPERTENSION 11/25/2007   Impaired glucose tolerance 04/21/2013   LOW BACK PAIN 11/25/2007   Lumbar radiculopathy, chronic    with right foot drop   PEPTIC ULCER DISEASE 11/25/2007   Peroneal nerve lesion of lower extremity, 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 He has never used smokeless tobacco. He reports that he does not currently use alcohol. He reports that he does not use drugs. family history includes Colon cancer in his  father. Allergies  Allergen Reactions   Aricept [Donepezil] Other (See Comments)    Crazy dreams   Current Outpatient Medications on File Prior to Visit  Medication Sig Dispense Refill   aspirin 81 MG tablet Take 81 mg by mouth daily.     atorvastatin (LIPITOR) 40 MG tablet Take 1 tablet (40 mg total) by mouth every evening. 90 tablet 3   b complex vitamins tablet Take 1 tablet by mouth daily.     Cholecalciferol (VITAMIN D) 2000 UNITS CAPS Take 2,000 Units by mouth daily.     clotrimazole-betamethasone (LOTRISONE) cream Use as directed twice per day as needed 45 g 1   donepezil (ARICEPT) 10 MG tablet Take 1 tablet (10 mg total) by mouth at bedtime. 90 tablet 3   propranolol ER (INDERAL LA) 60 MG 24 hr capsule Take 1 capsule (60 mg total) by mouth daily. 90 capsule 3   telmisartan (MICARDIS) 40 MG tablet Take 1 tablet (40 mg total) by mouth daily. 90 tablet 3   torsemide (DEMADEX) 10 MG tablet Take 1 tablet (10 mg total) by mouth as needed. 30 tablet 3   triamcinolone (NASACORT) 55 MCG/ACT AERO nasal inhaler Place 2 sprays into the nose daily. 1 each 12   triamcinolone cream (KENALOG) 0.1 % Apply topically  2 (two) times daily.     No current facility-administered medications on file prior to visit.        ROS:  All others reviewed and negative.  Objective        PE:  BP 122/64   Pulse 64   Temp 97.8 F (36.6 C) (Oral)   Ht 5\' 10"  (1.778 m)   Wt 173 lb (78.5 kg)   SpO2 93%   BMI 24.82 kg/m                 Constitutional: Pt appears in NAD               HENT: Head: NCAT.                Right Ear: External ear normal.                 Left Ear: External ear normal.                Eyes: . Pupils are equal, round, and reactive to light. Conjunctivae and EOM are normal               Nose: without d/c or deformity               Neck: Neck supple. Gross normal ROM               Cardiovascular: Normal rate and regular rhythm.                 Pulmonary/Chest: Effort normal and breath  sounds without rales or wheezing.                Abd:  Soft, NT, ND, + BS, no organomegaly               Neurological: Pt is alert. At baseline orientation, motor grossly intact               Skin: Skin is warm. No rashes, no other new lesions, LE edema - none               Psychiatric: Pt behavior is normal without agitation   Micro: none  Cardiac tracings I have personally interpreted today:  none  Pertinent Radiological findings (summarize): none   Lab Results  Component Value Date   WBC 4.1 11/28/2022   HGB 10.8 (L) 11/28/2022   HCT 30.9 (L) 11/28/2022   PLT 238.0 11/28/2022   GLUCOSE 116 (H) 11/28/2022   CHOL 134 10/08/2022   TRIG 56.0 10/08/2022   HDL 61.50 10/08/2022   LDLCALC 62 10/08/2022   ALT 18 11/28/2022   AST 22 11/28/2022   NA 136 11/28/2022   K 4.4 11/28/2022   CL 103 11/28/2022   CREATININE 1.15 11/28/2022   BUN 17 11/28/2022   CO2 27 11/28/2022   TSH 3.82 10/08/2022   PSA 2.00 01/25/2010   HGBA1C 6.5 10/08/2022   Assessment/Plan:  Ronald [name] is a 89 y.o. White or Caucasian [1] male with  has a past medical history of ALLERGIC RHINITIS (11/25/2007), ANXIETY (11/25/2007), COLONIC POLYPS, HX OF (11/25/2007), CORONARY ARTERY DISEASE (11/25/2007), Cough (01/04/2009), HEMORRHOIDS, INTERNAL (11/06/2010), HOARSENESS (01/04/2009), HYPERLIPIDEMIA (11/25/2007), HYPERTENSION (11/25/2007), Impaired glucose tolerance (04/21/2013), LOW BACK PAIN (11/25/2007), Lumbar radiculopathy, chronic, PEPTIC ULCER DISEASE (11/25/2007), Peroneal nerve lesion of lower extremity, bilateral (05/08/2017), Preventative health care (04/07/2011), PSA, INCREASED (01/05/2009), Right foot drop, Spasmodic dysphonia, and TREMOR, ESSENTIAL (11/25/2007).  Depression Recent mild worsening, for start citalopram 10 mg  qd, declines counseling referral  Essential hypertension BP Readings from Last 3 Encounters:  11/28/22 122/64  11/13/22 (!) 152/62  11/11/22 138/66   Stable, pt to continue medical treatment  inderal la 60 mg qd, micardis 40 qd   Elevated lipids Lab Results  Component Value Date   LDLCALC 62 10/08/2022   Stable, pt to continue current statin lipitor 40 mg qd   Anemia Stable it seems, no recent overt bleeding or symptoms, for f/u lab cbc,  to f/u any worsening symptoms or concerns  Followup: Return in about 6 months (around 05/31/2023).  Cathlean Cower, MD 12/01/2022 6:56 AM [facility]

## 2022-11-28 NOTE — Patient Instructions (Addendum)
Ok to stop the omega 3 and co-q10 supplements  Ok to continue the metamucil every day  Ok to continue the fluid pill when you have swelling that gets uncomfortable  Please take all new medication as prescribed - the citalopram 10 mg per day  Please continue all other medications as before, and refills have been done if requested.  Please have the pharmacy call with any other refills you may need.  Please keep your appointments with your specialists as you may have planned  Please go to the LAB at the blood drawing area for the tests to be done  You will be contacted by phone if any changes need to be made immediately.  Otherwise, you will receive a letter about your results with an explanation, but please check with MyChart first.  Please remember to sign up for MyChart if you have not done so, as this will be important to you in the future with finding out test results, communicating by private email, and scheduling acute appointments online when needed.  Please make an Appointment to return in 6 months, or sooner if needed

## 2022-12-01 ENCOUNTER — Encounter: Payer: Self-pay | Admitting: Internal Medicine

## 2022-12-01 DIAGNOSIS — D649 Anemia, unspecified: Secondary | ICD-10-CM | POA: Insufficient documentation

## 2022-12-01 NOTE — Assessment & Plan Note (Signed)
BP Readings from Last 3 Encounters:  11/28/22 122/64  11/13/22 (!) 152/62  11/11/22 138/66   Stable, pt to continue medical treatment inderal la 60 mg qd, micardis 40 qd

## 2022-12-01 NOTE — Assessment & Plan Note (Signed)
Recent mild worsening, for start citalopram 10 mg qd, declines counseling referral

## 2022-12-01 NOTE — Assessment & Plan Note (Signed)
Stable it seems, no recent overt bleeding or symptoms, for f/u lab cbc,  to f/u any worsening symptoms or concerns

## 2022-12-01 NOTE — Assessment & Plan Note (Signed)
Lab Results  Component Value Date   LDLCALC 62 10/08/2022   Stable, pt to continue current statin lipitor 40 mg qd

## 2022-12-06 ENCOUNTER — Other Ambulatory Visit: Payer: Self-pay | Admitting: Internal Medicine

## 2022-12-17 ENCOUNTER — Other Ambulatory Visit: Payer: Self-pay | Admitting: Internal Medicine

## 2022-12-17 NOTE — Telephone Encounter (Signed)
One year supply sent to pharmacy on 3/14

## 2022-12-26 ENCOUNTER — Telehealth: Payer: Self-pay | Admitting: Internal Medicine

## 2022-12-26 MED ORDER — PROPRANOLOL HCL ER 60 MG PO CP24: 60.0000 mg | ORAL_CAPSULE | Freq: Every day | ORAL | 2 refills | Status: DC

## 2022-12-26 MED ORDER — ATORVASTATIN CALCIUM 40 MG PO TABS: 40.0000 mg | ORAL_TABLET | Freq: Every evening | ORAL | 2 refills | Status: DC

## 2022-12-26 NOTE — Telephone Encounter (Signed)
Prescription Request  12/26/2022  LOV: 11/28/2022  What is the name of the medication or equipment? Atorvastatin and Propranolol  Have you contacted your pharmacy to request a refill? Yes   Which pharmacy would you like this sent to?  [facility] [address] [place], [place] - [address] [place] [address] Phone: [phone] Fax: [phone]    Patient notified that their request is being sent to the clinical staff for review and that they should receive a response within 2 business days.   Please advise at Mobile [phone] (mobile)

## 2022-12-26 NOTE — Telephone Encounter (Signed)
[facility] to [facility].../lmb 

## 2023-01-22 ENCOUNTER — Telehealth: Payer: Self-pay | Admitting: Internal Medicine

## 2023-01-22 ENCOUNTER — Other Ambulatory Visit: Payer: Self-pay | Admitting: Internal Medicine

## 2023-01-22 ENCOUNTER — Telehealth: Payer: Self-pay | Admitting: Cardiovascular Disease

## 2023-01-22 NOTE — Telephone Encounter (Signed)
Prescription Request  01/22/2023  LOV: 11/28/2022  What is the name of the medication or equipment?  telmisartan (MICARDIS) 40 MG tablet  Have you contacted your pharmacy to request a refill? Yes   Which pharmacy would you like this sent to?  [facility] [address] [place], [place] - [address] [place] [address] Phone: [phone] Fax: [phone]    Patient notified that their request is being sent to the clinical staff for review and that they should receive a response within 2 business days.   Please advise at Mobile [phone] (mobile)

## 2023-01-22 NOTE — Telephone Encounter (Signed)
Pt c/o medication issue:  1. Name of Medication:   torsemide (DEMADEX) 10 MG tablet  2. How are you currently taking this medication (dosage and times per day)?   3. Are you having a reaction (difficulty breathing--STAT)?   4. What is your medication issue?  Patient states he misplaced his prescription from January and just recently found it. He would like to discuss what the prescription is for.

## 2023-01-22 NOTE — Telephone Encounter (Signed)
Spoke to the pt daughter-in-law Bosie Clos (on [place]), she did not know that pt wasn't taking Torsemide. She called him on [address] and pt stated he hasn't taking Torsemide since January and forgot why the medication was prescribed. Explained the medication is for edema  and take as needed. Pt and Bosie Clos voiced understanding.Pt  stated pt has not had any swelling and he is currently losing weight. Will forward to MD and nurse.

## 2023-01-23 ENCOUNTER — Other Ambulatory Visit: Payer: Self-pay

## 2023-01-23 NOTE — Telephone Encounter (Signed)
Refill sent.

## 2023-05-01 ENCOUNTER — Encounter (INDEPENDENT_AMBULATORY_CARE_PROVIDER_SITE_OTHER): Payer: Self-pay

## 2023-05-30 ENCOUNTER — Ambulatory Visit (INDEPENDENT_AMBULATORY_CARE_PROVIDER_SITE_OTHER): Payer: Medicare Other | Admitting: Internal Medicine

## 2023-05-30 VITALS — BP 120/56 | HR 58 | Temp 97.8°F | Ht 70.0 in | Wt 163.0 lb

## 2023-05-30 DIAGNOSIS — Z7189 Other specified counseling: Secondary | ICD-10-CM | POA: Diagnosis not present

## 2023-05-30 DIAGNOSIS — R21 Rash and other nonspecific skin eruption: Secondary | ICD-10-CM

## 2023-05-30 DIAGNOSIS — R2689 Other abnormalities of gait and mobility: Secondary | ICD-10-CM | POA: Diagnosis not present

## 2023-05-30 DIAGNOSIS — F32A Depression, unspecified: Secondary | ICD-10-CM | POA: Diagnosis not present

## 2023-05-30 DIAGNOSIS — Z23 Encounter for immunization: Secondary | ICD-10-CM | POA: Diagnosis not present

## 2023-05-30 DIAGNOSIS — N3281 Overactive bladder: Secondary | ICD-10-CM | POA: Diagnosis not present

## 2023-05-30 DIAGNOSIS — D649 Anemia, unspecified: Secondary | ICD-10-CM

## 2023-05-30 DIAGNOSIS — F03A4 Unspecified dementia, mild, with anxiety: Secondary | ICD-10-CM

## 2023-05-30 LAB — CBC WITH DIFFERENTIAL/PLATELET
Basophils Absolute: 0 10*3/uL (ref 0.0–0.1)
Basophils Relative: 0.5 % (ref 0.0–3.0)
Eosinophils Absolute: 0.2 10*3/uL (ref 0.0–0.7)
Eosinophils Relative: 5.5 % — ABNORMAL HIGH (ref 0.0–5.0)
HCT: 30.7 % — ABNORMAL LOW (ref 39.0–52.0)
Hemoglobin: 10.2 g/dL — ABNORMAL LOW (ref 13.0–17.0)
Lymphocytes Relative: 20.5 % (ref 12.0–46.0)
Lymphs Abs: 0.8 10*3/uL (ref 0.7–4.0)
MCHC: 33.1 g/dL (ref 30.0–36.0)
MCV: 113.7 fl — ABNORMAL HIGH (ref 78.0–100.0)
Monocytes Absolute: 0.6 10*3/uL (ref 0.1–1.0)
Monocytes Relative: 15.6 % — ABNORMAL HIGH (ref 3.0–12.0)
Neutro Abs: 2.4 10*3/uL (ref 1.4–7.7)
Neutrophils Relative %: 57.9 % (ref 43.0–77.0)
Platelets: 245 10*3/uL (ref 150.0–400.0)
RBC: 2.7 Mil/uL — ABNORMAL LOW (ref 4.22–5.81)
RDW: 13.9 % (ref 11.5–15.5)
WBC: 4.1 10*3/uL (ref 4.0–10.5)

## 2023-05-30 LAB — HEPATIC FUNCTION PANEL
ALT: 19 U/L (ref 0–53)
AST: 22 U/L (ref 0–37)
Albumin: 3.7 g/dL (ref 3.5–5.2)
Alkaline Phosphatase: 58 U/L (ref 39–117)
Bilirubin, Direct: 0.2 mg/dL (ref 0.0–0.3)
Total Bilirubin: 0.9 mg/dL (ref 0.2–1.2)
Total Protein: 6.1 g/dL (ref 6.0–8.3)

## 2023-05-30 LAB — URINALYSIS, ROUTINE W REFLEX MICROSCOPIC
Bilirubin Urine: NEGATIVE
Hgb urine dipstick: NEGATIVE
Leukocytes,Ua: NEGATIVE
Nitrite: NEGATIVE
RBC / HPF: NONE SEEN (ref 0–?)
Specific Gravity, Urine: 1.02 (ref 1.000–1.030)
Total Protein, Urine: NEGATIVE
Urine Glucose: NEGATIVE
Urobilinogen, UA: 0.2 (ref 0.0–1.0)
WBC, UA: NONE SEEN (ref 0–?)
pH: 5.5 (ref 5.0–8.0)

## 2023-05-30 LAB — BASIC METABOLIC PANEL
BUN: 19 mg/dL (ref 6–23)
CO2: 29 meq/L (ref 19–32)
Calcium: 8.8 mg/dL (ref 8.4–10.5)
Chloride: 106 meq/L (ref 96–112)
Creatinine, Ser: 1.2 mg/dL (ref 0.40–1.50)
GFR: 52.83 mL/min — ABNORMAL LOW (ref >60.00)
Glucose, Bld: 96 mg/dL (ref 70–99)
Potassium: 4.4 meq/L (ref 3.5–5.1)
Sodium: 139 meq/L (ref 135–145)

## 2023-05-30 LAB — IBC PANEL
Iron: 68 ug/dL (ref 42–165)
Saturation Ratios: 27.1 % (ref 20.0–50.0)
TIBC: 250.6 ug/dL (ref 250.0–450.0)
Transferrin: 179 mg/dL — ABNORMAL LOW (ref 212.0–360.0)

## 2023-05-30 LAB — FERRITIN: Ferritin: 293.2 ng/mL (ref 22.0–322.0)

## 2023-05-30 LAB — VITAMIN B12: Vitamin B-12: 345 pg/mL (ref 211–911)

## 2023-05-30 MED ORDER — CITALOPRAM HYDROBROMIDE 20 MG PO TABS: 20.0000 mg | ORAL_TABLET | Freq: Every day | ORAL | 3 refills | Status: DC

## 2023-05-30 MED ORDER — SOLIFENACIN SUCCINATE 5 MG PO TABS: 5.0000 mg | ORAL_TABLET | Freq: Every day | ORAL | 3 refills | Status: DC

## 2023-05-30 MED ORDER — KETOCONAZOLE 2 % EX CREA: 1.0000 | TOPICAL_CREAM | Freq: Every day | CUTANEOUS | 0 refills | Status: DC

## 2023-05-30 MED ORDER — MEMANTINE HCL 10 MG PO TABS: 10.0000 mg | ORAL_TABLET | Freq: Two times a day (BID) | ORAL | 11 refills | Status: DC

## 2023-05-30 NOTE — Progress Notes (Unsigned)
Patient ID: Ronald [name], male   DOB: Feb 04, 1930, 89 y.o.   MRN: [id number]        Chief Complaint: follow up left leg rash, dementia, OAB, depression, anemia       HPI:  Ronald [name] is a 89 y.o. male here with wife, c/o urinary frequency and nocturia but Denies urinary symptoms such as dysuria, urgency, flank pain, hematuria or n/v, fever, chills. Has had mild worsening depressive symptoms, but no suicidal ideation, or panic; Asks for DNR for home, states "I am ready".  Has enlarging rash to distal left lateral leg above the ankle wihtout pain, seemed better with triam cr to start, but now just getting larger, no itching or ulcer.  No overt bleeding, bruising.  Wife states memory getting worse, asking for more eval and tx.  Balance getting worse as well.   Pt denies chest pain, increased sob or doe, wheezing, orthopnea, PND, increased LE swelling, palpitations, dizziness or syncope.   Pt denies polydipsia, polyuria, or new focal neuro s/s.  For flu shot       Wt Readings from Last 3 Encounters:  05/30/23 163 lb (73.9 kg)  11/28/22 173 lb (78.5 kg)  11/13/22 172 lb (78 kg)   BP Readings from Last 3 Encounters:  05/30/23 (!) 120/56  11/28/22 122/64  11/13/22 (!) 152/62         Past Medical History:  Diagnosis Date   ALLERGIC RHINITIS 11/25/2007   ANXIETY 11/25/2007   COLONIC POLYPS, HX OF 11/25/2007   CORONARY ARTERY DISEASE 11/25/2007   Cough 01/04/2009   HEMORRHOIDS, INTERNAL 11/06/2010   HOARSENESS 01/04/2009   HYPERLIPIDEMIA 11/25/2007   HYPERTENSION 11/25/2007   Impaired glucose tolerance 04/21/2013   LOW BACK PAIN 11/25/2007   Lumbar radiculopathy, chronic    with right foot drop   PEPTIC ULCER DISEASE 11/25/2007   Peroneal nerve lesion of lower extremity, 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 He has never used smokeless tobacco. He reports that he does not currently use alcohol. He reports that he does not use drugs. family history includes Colon cancer in his father. Allergies  Allergen Reactions   Aricept [Donepezil] Other (See Comments)    Crazy dreams   Current Outpatient Medications on File Prior to Visit  Medication Sig Dispense Refill   aspirin 81 MG tablet Take 81 mg by mouth daily.     atorvastatin (LIPITOR) 40 MG tablet Take 1 tablet (40 mg total) by mouth every evening. 90 tablet 2   b complex vitamins tablet Take 1 tablet by mouth daily.     Cholecalciferol (VITAMIN D) 2000 UNITS CAPS Take 2,000 Units by mouth daily.     donepezil (ARICEPT) 10 MG tablet Take 1 tablet (10 mg total) by mouth at bedtime. 90 tablet 3   propranolol ER (INDERAL LA) 60 MG 24 hr capsule Take 1 capsule (60 mg total) by mouth daily. 90 capsule 2   telmisartan (MICARDIS) 40 MG tablet Take 1 tablet by mouth once daily 90 tablet 3   triamcinolone cream (KENALOG) 0.1 %  Apply topically 2 (two) times daily.     clotrimazole-betamethasone (LOTRISONE) cream Use as directed twice per day as needed (Patient not taking: Reported on 05/30/2023) 45 g 1   torsemide (DEMADEX) 10 MG tablet Take 1 tablet (10 mg total) by mouth as needed. (Patient not taking: Reported on 05/30/2023) 30 tablet 3   triamcinolone (NASACORT) 55 MCG/ACT AERO nasal inhaler Place 2 sprays into the nose daily. (Patient not taking: Reported on 05/30/2023) 1 each 12   No current facility-administered medications on file prior to visit.        ROS:  All others reviewed and negative.  Objective        PE:  BP (!) 120/56 (BP Location: Left Arm, Patient Position: Sitting, Cuff Size: Normal)   Pulse (!) 58   Temp 97.8 F (36.6 C) (Oral)   Ht 5\' 10"  (1.778 m)   Wt 163 lb (73.9 kg)   SpO2 95%    BMI 23.39 kg/m                 Constitutional: Pt appears in NAD               HENT: Head: NCAT.                Right Ear: External ear normal.                 Left Ear: External ear normal.                Eyes: . Pupils are equal, round, and reactive to light. Conjunctivae and EOM are normal               Nose: without d/c or deformity               Neck: Neck supple. Gross normal ROM               Cardiovascular: Normal rate and regular rhythm.                 Pulmonary/Chest: Effort normal and breath sounds without rales or wheezing.                Abd:  Soft, NT, ND, + BS, no organomegaly               Neurological: Pt is alert. At baseline orientation, motor grossly intact               Skin: Skin is warm, LE edema - none, for distal LLE area 6 x 3 cm nontender rash with erythem serpigiinous edges               Psychiatric: Pt behavior is normal without agitation   Micro: none  Cardiac tracings I have personally interpreted today:  none  Pertinent Radiological findings (summarize): none   Lab Results  Component Value Date   WBC 4.1 05/30/2023   HGB 10.2 (L) 05/30/2023   HCT 30.7 (L) 05/30/2023   PLT 245.0 05/30/2023   GLUCOSE 96 05/30/2023   CHOL 134 10/08/2022   TRIG 56.0 10/08/2022   HDL 61.50 10/08/2022   LDLCALC 62 10/08/2022   ALT 19 05/30/2023   AST 22 05/30/2023   NA 139 05/30/2023   K 4.4 05/30/2023   CL 106 05/30/2023   CREATININE 1.20 05/30/2023   BUN 19 05/30/2023   CO2 29 05/30/2023   TSH 3.82 10/08/2022   PSA 2.00 01/25/2010   HGBA1C 6.5 10/08/2022   Assessment/Plan:  Ronald [name] is a 89 y.o. White or Caucasian [1] male with  has a past medical history of ALLERGIC RHINITIS (11/25/2007), ANXIETY (11/25/2007), COLONIC POLYPS, HX OF (11/25/2007), CORONARY ARTERY DISEASE (11/25/2007), Cough (01/04/2009), HEMORRHOIDS, INTERNAL (11/06/2010), HOARSENESS (01/04/2009), HYPERLIPIDEMIA (11/25/2007), HYPERTENSION (11/25/2007), Impaired glucose tolerance (04/21/2013), LOW  BACK PAIN (11/25/2007), Lumbar radiculopathy, chronic, PEPTIC ULCER DISEASE (11/25/2007), Peroneal nerve lesion of lower extremity, bilateral (05/08/2017), Preventative health care (04/07/2011), PSA, INCREASED (01/05/2009), Right foot drop, Spasmodic dysphonia, and TREMOR, ESSENTIAL (11/25/2007).  Dementia (HCC) With mild worsening, for add namena 10 bid, cont aricept 10, for MRI brain, for neurology referral  Balance disorder Pt to continue walker, declines PT for now  Depression With mild worsening, for increased celexa 20 qd  DNR (do not resuscitate) discussion Pt for out of hosp form signed  Rash Large area distal LLE without pain or ulcer bt with erythem serpiginous edges - for trial ketocon cr asd, also refer dermatology  Anemia No recent overt bleeding, has several small bruises to arms - for f/u cbc  OAB (overactive bladder) Pt for start vesicare 5 every day,, keep urinarl closer to bed to avoid falls at night, refer urology  Followup: No follow-ups on file.  Oliver Barre, MD 06/01/2023 4:39 PM [facility]

## 2023-05-30 NOTE — Patient Instructions (Addendum)
Please take all new medication as prescribed - namenda 10 mg twice per day for memory, and vesicare 5 mg per day for bladder, and the new cream for the rash  Ok to increase the celexa to 20 mg per day  Please continue all other medications as before.  Please have the pharmacy call with any other refills you may need.  Please continue your efforts at being more active, low cholesterol diet, and weight control.  Please keep your appointments with your specialists as you may have planned  You will be contacted regarding the referral for: MRI brain, Neurology, Dermatology, and Urology  Your DNR form is signed today  Please go to the LAB at the blood drawing area for the tests to be done  You will be contacted by phone if any changes need to be made immediately.  Otherwise, you will receive a letter about your results with an explanation, but please check with MyChart first.  Please make an Appointment to return in 6 months, or sooner if needed

## 2023-05-30 NOTE — Progress Notes (Signed)
The test results show that your current treatment is OK, as the tests are stable.  Please continue the same plan.  There is no other need for change of treatment or further evaluation based on these results, at this time.  thanks

## 2023-06-01 ENCOUNTER — Encounter: Payer: Self-pay | Admitting: Internal Medicine

## 2023-06-01 DIAGNOSIS — N3281 Overactive bladder: Secondary | ICD-10-CM | POA: Insufficient documentation

## 2023-06-01 NOTE — Assessment & Plan Note (Signed)
With mild worsening, for add namena 10 bid, cont aricept 10, for MRI brain, for neurology referral

## 2023-06-01 NOTE — Assessment & Plan Note (Signed)
With mild worsening, for increased celexa 20 qd

## 2023-06-01 NOTE — Assessment & Plan Note (Signed)
Pt to continue walker, declines PT for now

## 2023-06-01 NOTE — Assessment & Plan Note (Signed)
Large area distal LLE without pain or ulcer bt with erythem serpiginous edges - for trial ketocon cr asd, also refer dermatology

## 2023-06-01 NOTE — Assessment & Plan Note (Addendum)
Pt for start vesicare 5 every day,, keep urinarl closer to bed to avoid falls at night, refer urology

## 2023-06-01 NOTE — Assessment & Plan Note (Signed)
Pt for out of hosp form signed

## 2023-06-01 NOTE — Assessment & Plan Note (Signed)
No recent overt bleeding, has several small bruises to arms - for f/u cbc

## 2023-06-02 ENCOUNTER — Encounter: Payer: Self-pay | Admitting: Physician Assistant

## 2023-06-03 ENCOUNTER — Telehealth: Payer: Self-pay

## 2023-06-03 NOTE — Telephone Encounter (Signed)
Pts POA has called and stated pt is on 2 different medications. One medication is for pt to urinate more and the other is for the pt to urinate less. Pts POA has also sated pts PSA has not been checked in over 10 years and is concerned.   Please call Ronald [name] the pts POA at [phone].

## 2023-06-03 NOTE — Telephone Encounter (Signed)
Ok to continue all medications as prescribed  PSA is not recommended for screening at his age.  thanks

## 2023-06-04 NOTE — Telephone Encounter (Signed)
Spoke with POA and she states understanding

## 2023-06-04 NOTE — Telephone Encounter (Signed)
Called and unable to leave voicemail

## 2023-06-17 ENCOUNTER — Other Ambulatory Visit: Payer: Self-pay

## 2023-06-17 ENCOUNTER — Other Ambulatory Visit: Payer: Self-pay | Admitting: Internal Medicine

## 2023-06-22 ENCOUNTER — Ambulatory Visit
Admission: RE | Admit: 2023-06-22 | Discharge: 2023-06-22 | Disposition: A | Discharge status: Home / Self Care | Payer: Medicare Other | Source: Ambulatory Visit | Attending: Internal Medicine | Admitting: Internal Medicine

## 2023-06-22 DIAGNOSIS — F03A4 Unspecified dementia, mild, with anxiety: Secondary | ICD-10-CM

## 2023-06-25 ENCOUNTER — Ambulatory Visit: Payer: Medicare Other | Admitting: Physician Assistant

## 2023-06-25 ENCOUNTER — Ambulatory Visit: Payer: Medicare Other

## 2023-06-25 ENCOUNTER — Encounter: Payer: Self-pay | Admitting: Physician Assistant

## 2023-06-25 ENCOUNTER — Other Ambulatory Visit (INDEPENDENT_AMBULATORY_CARE_PROVIDER_SITE_OTHER): Payer: Medicare Other

## 2023-06-25 VITALS — BP 118/58 | HR 79 | Resp 20 | Ht 70.0 in | Wt 163.0 lb

## 2023-06-25 DIAGNOSIS — F02B18 Dementia in other diseases classified elsewhere, moderate, with other behavioral disturbance: Secondary | ICD-10-CM | POA: Diagnosis not present

## 2023-06-25 DIAGNOSIS — R413 Other amnesia: Secondary | ICD-10-CM | POA: Diagnosis not present

## 2023-06-25 DIAGNOSIS — G250 Essential tremor: Secondary | ICD-10-CM

## 2023-06-25 DIAGNOSIS — G301 Alzheimer's disease with late onset: Secondary | ICD-10-CM

## 2023-06-25 NOTE — Progress Notes (Addendum)
Assessment/Plan:     Ronald [name] is a very pleasant 89 y.o. year old RH male with a history of hypertension, hyperlipidemia,  essential tremors, seen today for evaluation of memory loss. MoCA today is 11 /30. Personally reviewed MRI of the brain (06/22/2023) has been ordered, but the images are not yet read by radiology.  No apparent acute findings are seen, bur noted moderate cerebral  and hypocampal atrophy, as well as chronic microvascular changes.  Findings are concerning for Alzheimer's disease. He is on donepezil 10 mg daily and memantine 10 mg twice daily by PCP  Side effects discussed Patient needs assistance with some ADLs.  Patient no longer drives.  Dementia likely due to Alzheimer's disease, late onset, with behavioral disturbance  Continue donepezil 10 mg daily and memantine 10 mg twice a day Recommend B12 supplement ( 345) Check TSH  Recommend good control of cardiovascular risk factors.   Recommend not using large amounts of Tylenol, use local pain medicine instead, such as lidocaine patch. Continue to control mood as per PCP [place] up in 6 months   Essential Tremor, stable Continue Inderal LA 60 mg daily by PCP  Subjective:    The patient is accompanied by his wife and daughter in law who supplements the history.    How long did patient have memory difficulties?  For the last 2 years.  Patient has difficulty remembering new information, recent conversations and names of people, sometimes of his grandchildren. Cannot retain new information. LTM is good, able to recall details from the past repeats oneself?  Endorsed, frequently  disoriented when walking into a room? Denies  Sometimes I wake up and I don't know where he is.  Leaving objects in unusual places?   Denies.  Wandering behavior? Denies.   Any personality changes, or depression, anxiety?  Has moments of irritability and depression but not often.  Hallucinations or paranoia? "  I saw a man at the end of the  bed and it went away, it happened several times at night" but this is not frightening   Seizures? Denies.    Any sleep changes?  Sleeps well. He reports recent vivid dreams, REM behavior. He may have some sleepwalking, "and can end up in the closet".   Sleep apnea? Denies.   Any hygiene concerns? He needs to be reminded at times Independent of bathing and dressing?  Denies  Who is in charge of the medications? Patient is in charge   Who is in charge of the finances?  Patient is in charge     Any changes in appetite?  Decreased. "I eat the same everyday, except for dinner time"  ."I'm always full".  Patient have trouble swallowing?  Denies.   Does the patient cook?  No   Any headaches?  Denies.   Chronic back pain?  Denies.   Ambulates with difficulty?  Patient has a history of right foot drop (chronic needs a walker to ambulate) "Takes a lot of tylenol daily for pain".  Recent falls or head injuries? Larey Seat out of the bed with head injury but no LOC.   He wrestled in college "I am sure I got kicked several times".  Vision changes? Denies. Stroke like symptoms?  Denies.   Any tremors?  He has a history of essential tremors, initially seen in our office, placed on Inderal LA with good response.  His tremor is worse when trying to write or sign names he also has a history of head  tremor and a history of mild rest tremor in the right hand. Any anosmia?  Denies.   Any incontinence of urine?  Endorsed, has urine frequency he takes Vesicare, he is awaiting urology appointment. Any bowel dysfunction? Denies.      Patient lives  with his wife  History of heavy alcohol intake? Denies.   History of heavy tobacco use? Denies.   Family history of dementia?  Mother had dementia ?type .   Does patient drive? No longer drives, because he was getting lost  Retired Public librarian at [employer]. He flew airplanes as well   Allergies  Allergen Reactions   Aricept [Donepezil] Other (See  Comments)    Crazy dreams    Current Outpatient Medications  Medication Instructions   aspirin 81 mg, Oral, Daily,     atorvastatin (LIPITOR) 40 mg, Oral, Every evening   b complex vitamins tablet 1 tablet, Oral, Daily   citalopram (CELEXA) 20 mg, Oral, Daily   clotrimazole-betamethasone (LOTRISONE) cream Use as directed twice per day as needed   donepezil (ARICEPT) 10 mg, Oral, Daily at bedtime   ketoconazole (NIZORAL) 2 % cream APPLY  CREAM TOPICALLY ONCE DAILY   memantine (NAMENDA) 10 mg, Oral, 2 times daily   propranolol ER (INDERAL LA) 60 mg, Oral, Daily   solifenacin (VESICARE) 5 mg, Oral, Daily   telmisartan (MICARDIS) 40 mg, Oral, Daily   torsemide (DEMADEX) 10 mg, Oral, As needed   triamcinolone (NASACORT) 55 MCG/ACT AERO nasal inhaler 2 sprays, Nasal, Daily   triamcinolone cream (KENALOG) 0.1 % Topical, 2 times daily   Vitamin D 2,000 Units, Daily     VITALS:   Vitals:   06/25/23 1306  BP: (!) 118/58  Pulse: 79  Resp: 20  SpO2: 95%  Weight: 163 lb (73.9 kg)  Height: 5\' 10"  (1.778 m)      PHYSICAL EXAM   HEENT:  Normocephalic, atraumatic. The mucous membranes are moist. The superficial temporal arteries are without ropiness or tenderness. Cardiovascular: Regular rate and rhythm. Lungs: Clear to auscultation bilaterally. Neck: There are no carotid bruits noted bilaterally.  NEUROLOGICAL:    06/25/2023    1:00 PM  Montreal Cognitive Assessment   Visuospatial/ Executive (0/5) 2  Naming (0/3) 0  Attention: Read list of digits (0/2) 2  Attention: Read list of letters (0/1) 1  Attention: Serial 7 subtraction starting at 100 (0/3) 0  Language: Repeat phrase (0/2) 0  Language : Fluency (0/1) 1  Abstraction (0/2) 1  Delayed Recall (0/5) 0  Orientation (0/6) 4  Total 11  Adjusted Score (based on education) 11       11/06/2016    9:13 AM  MMSE - Mini Mental State Exam  Orientation to time 5  Orientation to Place 5  Registration 3  Attention/ Calculation  5  Recall 2  Language- name 2 objects 2  Language- repeat 1  Language- follow 3 step command 3  Language- read & follow direction 1  Write a sentence 1  Copy design 1  Total score 29     Orientation:  Alert and oriented to person, not to place and time. No aphasia or dysarthria. Fund of knowledge is reduced. Recent and remote memory impaired.  Attention and concentration are reduced.  Unable to name objects and unable to repeat phrases. Delayed recall  0/5  Cranial nerves: There is good facial symmetry. Extraocular muscles are intact and visual fields are full to confrontational testing. Speech is fluent and clear, no  tongue deviation. Hearing is decreased to conversational tone. Tone: Tone is good throughout. No cogwheeling Sensation: Sensation is intact to light touch and pinprick throughout. Vibration is intact at the bilateral big toe. Coordination: The patient has no difficulty with RAM's or FNF bilaterally. Normal finger to nose  Motor: Strength is 5/5 in the bilateral upper and lower extremities. There is no pronator drift. There are no fasciculations noted. Tremors: mild rest R hand tremor, B mild intention tremor, resting head tremor. DTR's: Deep tendon reflexes are 2/4 .  Plantar responses are downgoing bilaterally. Gait and Station: The patient is able to ambulate with difficulty. Needs a walker to ambulate due to chronic R foot drop . Gait is cautious and narrow.      Thank you for allowing [place] the opportunity to participate in the care of this nice patient. Please do not hesitate to contact us for any questions or concerns.   Total time spent on today's visit was 45 minutes dedicated to this patient today, preparing to see patient, examining the patient, ordering tests and/or medications and counseling the patient, documenting clinical information in the EHR or other health record, independently interpreting results and communicating results to the patient/family, discussing  treatment and goals, answering patient's questions and coordinating care.  Cc:  [name], MD  Marlowe Kays 06/25/2023 5:05 PM

## 2023-06-25 NOTE — Patient Instructions (Addendum)
It was a pleasure to see you today at our office.   Recommendations:  Neurocognitive evaluation at our office    Decrease tylenol and try topical medicines such as Salon Spas, etc.  Check labs today   Continue donepezil 10 mg daily and memantine 10 mg twice daily.  Side effects discussed  Follow up in 6 months Recommend visiting the website : " Dementia Success [facility]" to better understand some behaviors related to memory loss.    For psychiatric meds, mood meds: Please have your primary care physician manage these medications.  If you have any severe symptoms of a stroke, or other severe issues such as confusion,severe chills or fever, etc call 911 or go to the ER as you may need to be evaluated further   For guidance regarding [facility] and if placement were needed at the facility, contact Social Worker tel: [phone]  For assessment of decision of mental capacity and competency:  Call Dr. Erick Blinks, geriatric psychiatrist at [phone]  Counseling regarding caregiver distress, including caregiver depression, anxiety and issues regarding community resources, adult day care programs, adult living facilities, or memory care questions:  please contact your  Primary Doctor's Social Worker   Whom to call: Memory  decline, memory medications: Call our office [phone]    [email or web address]   RECOMMENDATIONS FOR ALL PATIENTS WITH MEMORY PROBLEMS: 1. Continue to exercise (Recommend 30 minutes of walking everyday, or 3 hours every week) 2. Increase social interactions - continue going to [place] and enjoy social gatherings with friends and family 3. Eat healthy, avoid fried foods and eat more fruits and vegetables 4. Maintain adequate blood pressure, blood sugar, and blood cholesterol level. Reducing the risk of stroke and cardiovascular disease also helps promoting better memory. 5. Avoid stressful  situations. Live a simple life and avoid aggravations. Organize your time and prepare for the next day in anticipation. 6. Sleep well, avoid any interruptions of sleep and avoid any distractions in the bedroom that may interfere with adequate sleep quality 7. Avoid sugar, avoid sweets as there is a strong link between excessive sugar intake, diabetes, and cognitive impairment We discussed the Mediterranean diet, which has been shown to help patients reduce the risk of progressive memory disorders and reduces cardiovascular risk. This includes eating fish, eat fruits and green leafy vegetables, nuts like almonds and hazelnuts, walnuts, and also use olive oil. Avoid fast foods and fried foods as much as possible. Avoid sweets and sugar as sugar use has been linked to worsening of memory function.  There is always a concern of gradual progression of memory problems. If this is the case, then we may need to adjust level of care according to patient needs. Support, both to the patient and caregiver, should then be put into place.      You have been referred for a neuropsychological evaluation (i.e., evaluation of memory and thinking abilities). Please bring someone with you to this appointment if possible, as it is helpful for the doctor to hear from both you and another adult who knows you well. Please bring eyeglasses and hearing aids if you wear them.    The evaluation will take approximately 3 hours and has two parts:   The first part is a clinical interview with the neuropsychologist (Dr. Milbert Coulter or Dr. Roseanne Reno). During the interview, the neuropsychologist will speak with you and the individual you brought to the appointment.    The second part of the evaluation is testing with the doctor's  technician Annabelle Harman or Selena Batten). During the testing, the technician will ask you to remember different types of material, solve problems, and answer some questionnaires. Your family member will not be present for this  portion of the evaluation.   Please note: We must reserve several hours of the neuropsychologist's time and the psychometrician's time for your evaluation appointment. As such, there is a No-Show fee of $100. If you are unable to attend any of your appointments, please contact our office as soon as possible to reschedule.      DRIVING: Regarding driving, in patients with progressive memory problems, driving will be impaired. We advise to have someone else do the driving if trouble finding directions or if minor accidents are reported. Independent driving assessment is available to determine safety of driving.   If you are interested in the driving assessment, you can contact the following:  [facility] in [place] [phone]  [facility] [phone]  [facility] [phone]  [facility] [phone] or [phone]   FALL PRECAUTIONS: Be cautious when walking. Scan the area for obstacles that may increase the risk of trips and falls. When getting up in the mornings, sit up at the edge of the bed for a few minutes before getting out of bed. Consider elevating the bed at the head end to avoid drop of blood pressure when getting up. Walk always in a well-lit room (use night lights in the walls). Avoid area rugs or power cords from appliances in the middle of the walkways. Use a walker or a cane if necessary and consider physical therapy for balance exercise. Get your eyesight checked regularly.  FINANCIAL OVERSIGHT: Supervision, especially oversight when making financial decisions or transactions is also recommended.  HOME SAFETY: Consider the safety of the kitchen when operating appliances like stoves, microwave oven, and blender. Consider having supervision and share cooking responsibilities until no longer able to participate in those. Accidents with firearms and other hazards in the house should be identified and addressed as  well.   ABILITY TO BE LEFT ALONE: If patient is unable to contact 911 operator, consider using LifeLine, or when the need is there, arrange for someone to stay with patients. Smoking is a fire hazard, consider supervision or cessation. Risk of wandering should be assessed by caregiver and if detected at any point, supervision and safe proof recommendations should be instituted.  MEDICATION SUPERVISION: Inability to self-administer medication needs to be constantly addressed. Implement a mechanism to ensure safe administration of the medications.      Mediterranean Diet A Mediterranean diet refers to food and lifestyle choices that are based on the traditions of countries located on the [place]. This way of eating has been shown to help prevent certain conditions and improve outcomes for people who have chronic diseases, like kidney disease and heart disease. What are tips for following this plan? Lifestyle  Cook and eat meals together with your family, when possible. Drink enough fluid to keep your urine clear or pale yellow. Be physically active every day. This includes: Aerobic exercise like running or swimming. Leisure activities like gardening, walking, or housework. Get 7-8 hours of sleep each night. If recommended by your health care provider, drink red wine in moderation. This means 1 glass a day for nonpregnant women and 2 glasses a day for men. A glass of wine equals 5 oz (150 mL). Reading food labels  Check the serving size of packaged foods. For foods such as rice and pasta, the  serving size refers to the amount of cooked product, not dry. Check the total fat in packaged foods. Avoid foods that have saturated fat or trans fats. Check the ingredients list for added sugars, such as corn syrup. Shopping  At the grocery store, buy most of your food from the areas near the walls of the store. This includes: Fresh fruits and vegetables (produce). Grains, beans, nuts, and  seeds. Some of these may be available in unpackaged forms or large amounts (in bulk). Fresh seafood. Poultry and eggs. Low-fat dairy products. Buy whole ingredients instead of prepackaged foods. Buy fresh fruits and vegetables in-season from local farmers markets. Buy frozen fruits and vegetables in resealable bags. If you do not have access to quality fresh seafood, buy precooked frozen shrimp or canned fish, such as tuna, salmon, or sardines. Buy small amounts of raw or cooked vegetables, salads, or olives from the deli or salad bar at your store. Stock your pantry so you always have certain foods on hand, such as olive oil, canned tuna, canned tomatoes, rice, pasta, and beans. Cooking  Cook foods with extra-virgin olive oil instead of using butter or other vegetable oils. Have meat as a side dish, and have vegetables or grains as your main dish. This means having meat in small portions or adding small amounts of meat to foods like pasta or stew. Use beans or vegetables instead of meat in common dishes like chili or lasagna. Experiment with different cooking methods. Try roasting or broiling vegetables instead of steaming or sauteing them. Add frozen vegetables to soups, stews, pasta, or rice. Add nuts or seeds for added healthy fat at each meal. You can add these to yogurt, salads, or vegetable dishes. Marinate fish or vegetables using olive oil, lemon juice, garlic, and fresh herbs. Meal planning  Plan to eat 1 vegetarian meal one day each week. Try to work up to 2 vegetarian meals, if possible. Eat seafood 2 or more times a week. Have healthy snacks readily available, such as: Vegetable sticks with hummus. Greek yogurt. Fruit and nut trail mix. Eat balanced meals throughout the week. This includes: Fruit: 2-3 servings a day Vegetables: 4-5 servings a day Low-fat dairy: 2 servings a day Fish, poultry, or lean meat: 1 serving a day Beans and legumes: 2 or more servings a week Nuts  and seeds: 1-2 servings a day Whole grains: 6-8 servings a day Extra-virgin olive oil: 3-4 servings a day Limit red meat and sweets to only a few servings a month What are my food choices? Mediterranean diet Recommended Grains: Whole-grain pasta. Brown rice. Bulgar wheat. Polenta. Couscous. Whole-wheat bread. Orpah Cobb. Vegetables: Artichokes. Beets. Broccoli. Cabbage. Carrots. Eggplant. Green beans. Chard. Kale. Spinach. Onions. Leeks. Peas. Squash. Tomatoes. Peppers. Radishes. Fruits: Apples. Apricots. Avocado. Berries. Bananas. Cherries. Dates. Figs. Grapes. Lemons. Melon. Oranges. Peaches. Plums. Pomegranate. Meats and other protein foods: Beans. Almonds. Sunflower seeds. Pine nuts. Peanuts. Cod. Salmon. Scallops. Shrimp. Tuna. Tilapia. Clams. Oysters. Eggs. Dairy: Low-fat milk. Cheese. Greek yogurt. Beverages: Water. Red wine. Herbal tea. Fats and oils: Extra virgin olive oil. Avocado oil. Grape seed oil. Sweets and desserts: [place] yogurt with honey. Baked apples. Poached pears. Trail mix. Seasoning and other foods: Basil. Cilantro. Coriander. Cumin. Mint. Parsley. Sage. Rosemary. Tarragon. Garlic. Oregano. Thyme. Pepper. Balsalmic vinegar. Tahini. Hummus. Tomato sauce. Olives. Mushrooms. Limit these Grains: Prepackaged pasta or rice dishes. Prepackaged cereal with added sugar. Vegetables: Deep fried potatoes (french fries). Fruits: Fruit canned in syrup. Meats and other protein foods: Beef.  Pork. Randa Lynn. Poultry with skin. Hot dogs. Tomasa Blase. Dairy: Ice cream. Sour cream. Whole milk. Beverages: Juice. Sugar-sweetened soft drinks. Beer. Liquor and spirits. Fats and oils: Butter. Canola oil. Vegetable oil. Beef fat (tallow). Lard. Sweets and desserts: Cookies. Cakes. Pies. Candy. Seasoning and other foods: Mayonnaise. Premade sauces and marinades. The items listed may not be a complete list. Talk with your dietitian about what dietary choices are right for you. Summary The  Mediterranean diet includes both food and lifestyle choices. Eat a variety of fresh fruits and vegetables, beans, nuts, seeds, and whole grains. Limit the amount of red meat and sweets that you eat. Talk with your health care provider about whether it is safe for you to drink red wine in moderation. This means 1 glass a day for nonpregnant women and 2 glasses a day for men. A glass of wine equals 5 oz (150 mL). This information is not intended to replace advice given to you by your health care provider. Make sure you discuss any questions you have with your health care provider. Document Released: 04/25/2016 Document Revised: 05/28/2016 Document Reviewed: 04/25/2016 Elsevier Interactive Patient Education  2017 [employer].

## 2023-06-26 LAB — TSH: TSH: 2.78 u[IU]/mL (ref 0.35–5.50)

## 2023-06-26 NOTE — Progress Notes (Signed)
Thyroid is normal  Thanks

## 2023-07-01 ENCOUNTER — Encounter: Payer: Self-pay | Admitting: Physician Assistant

## 2023-07-15 DIAGNOSIS — N3281 Overactive bladder: Secondary | ICD-10-CM | POA: Diagnosis not present

## 2023-07-22 ENCOUNTER — Other Ambulatory Visit: Payer: Self-pay

## 2023-07-22 ENCOUNTER — Other Ambulatory Visit: Payer: Self-pay | Admitting: Internal Medicine

## 2023-08-17 DIAGNOSIS — R296 Repeated falls: Secondary | ICD-10-CM | POA: Insufficient documentation

## 2023-08-26 ENCOUNTER — Ambulatory Visit (INDEPENDENT_AMBULATORY_CARE_PROVIDER_SITE_OTHER): Payer: Medicare Other | Admitting: Internal Medicine

## 2023-08-26 ENCOUNTER — Encounter: Payer: Self-pay | Admitting: Internal Medicine

## 2023-08-26 VITALS — BP 136/72 | HR 64 | Temp 98.5°F | Ht 70.0 in | Wt 169.0 lb

## 2023-08-26 DIAGNOSIS — R269 Unspecified abnormalities of gait and mobility: Secondary | ICD-10-CM

## 2023-08-26 DIAGNOSIS — F32A Depression, unspecified: Secondary | ICD-10-CM

## 2023-08-26 DIAGNOSIS — I10 Essential (primary) hypertension: Secondary | ICD-10-CM | POA: Diagnosis not present

## 2023-08-26 DIAGNOSIS — J309 Allergic rhinitis, unspecified: Secondary | ICD-10-CM

## 2023-08-26 DIAGNOSIS — R5381 Other malaise: Secondary | ICD-10-CM | POA: Insufficient documentation

## 2023-08-26 MED ORDER — CITALOPRAM HYDROBROMIDE 20 MG PO TABS: 20.0000 mg | ORAL_TABLET | Freq: Every day | ORAL | 3 refills | Status: DC

## 2023-08-26 NOTE — Assessment & Plan Note (Signed)
With mild worsening, ok to restart celexa 20 qd

## 2023-08-26 NOTE — Assessment & Plan Note (Signed)
BP Readings from Last 3 Encounters:  08/26/23 136/72  06/25/23 (!) 118/58  05/30/23 (!) 120/56   Stable, pt to continue medical treatment inderall la 60 every day, micardis 40 qd

## 2023-08-26 NOTE — Assessment & Plan Note (Signed)
Mild to mod, for also add nasacort asd,  to f/u any worsening symptoms or concerns

## 2023-08-26 NOTE — Progress Notes (Signed)
Patient ID: Ronald [name], male   DOB: Jan 17, 1930, 89 y.o.   MRN: [id number]        Chief Complaint: follow up with wife with generalized weakness, depression, htn, allergies       HPI:  Ronald [name] is a 89 y.o. male here overall doing ok, Does have several wks ongoing nasal allergy symptoms with clearish congestion, itch and sneezing, without fever, pain, ST, cough, swelling or wheezing.  Does have worsening generalized weakness , worse in the AM to waking up, shakes a lot but forced to get up to the BR right away most days.  No falls.  Wife concerned he is not taking enough fluids.  Denies worsening reflux, abd pain, dysphagia, n/v, bowel change or blood.  Pt denies chest pain, increased sob or doe, wheezing, orthopnea, PND, increased LE swelling, palpitations, dizziness or syncope.   Pt denies fever, wt loss, night sweats, loss of appetite, or other constitutional symptoms  Denies urinary symptoms such as dysuria, frequency, urgency, flank pain, hematuria or n/v, fever, chills.  Has had mild worsening depressive symptoms, but no suicidal ideation, has not been taking celexa recently.  .        Wt Readings from Last 3 Encounters:  08/26/23 169 lb (76.7 kg)  06/25/23 163 lb (73.9 kg)  05/30/23 163 lb (73.9 kg)   BP Readings from Last 3 Encounters:  08/26/23 136/72  06/25/23 (!) 118/58  05/30/23 (!) 120/56         Past Medical History:  Diagnosis Date   ALLERGIC RHINITIS 11/25/2007   ANXIETY 11/25/2007   COLONIC POLYPS, HX OF 11/25/2007   CORONARY ARTERY DISEASE 11/25/2007   Cough 01/04/2009   HEMORRHOIDS, INTERNAL 11/06/2010   HOARSENESS 01/04/2009   HYPERLIPIDEMIA 11/25/2007   HYPERTENSION 11/25/2007   Impaired glucose tolerance 04/21/2013   LOW BACK PAIN 11/25/2007   Lumbar radiculopathy, chronic    with right foot drop   PEPTIC ULCER DISEASE 11/25/2007   Peroneal nerve lesion of lower extremity, 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 He has never used smokeless tobacco. He reports that he does not currently use alcohol. He reports that he does not use drugs. family history includes Colon cancer in his father. Allergies  Allergen Reactions   Aricept [Donepezil] Other (See Comments)    Crazy dreams   Current Outpatient Medications on File Prior to Visit  Medication Sig Dispense Refill   aspirin 81 MG tablet Take 81 mg by mouth daily.     atorvastatin (LIPITOR) 40 MG tablet Take 1 tablet (40 mg total) by mouth every evening. 90 tablet 2   b complex vitamins tablet Take 1 tablet by mouth daily.     Cholecalciferol (VITAMIN D) 2000 UNITS CAPS Take 2,000 Units by mouth daily.     GEMTESA 75 MG TABS SMARTSIG:1 Tablet(s) By Mouth Every Evening     ketoconazole (NIZORAL) 2 % cream APPLY  CREAM TOPICALLY ONCE DAILY 30 g 0   levocetirizine (XYZAL) 5 MG tablet Take 5 mg by mouth daily as needed.  memantine (NAMENDA) 10 MG tablet Take 1 tablet (10 mg total) by mouth 2 (two) times daily. 60 tablet 11   propranolol ER (INDERAL LA) 60 MG 24 hr capsule Take 1 capsule (60 mg total) by mouth daily. 90 capsule 2   solifenacin (VESICARE) 5 MG tablet Take 1 tablet (5 mg total) by mouth daily. 90 tablet 3   telmisartan (MICARDIS) 40 MG tablet Take 1 tablet by mouth once daily 90 tablet 3   triamcinolone cream (KENALOG) 0.1 % Apply topically 2 (two) times daily.     clotrimazole-betamethasone (LOTRISONE) cream Use as directed twice per day as needed (Patient not taking: Reported on 05/30/2023) 45 g 1   donepezil (ARICEPT) 10 MG tablet Take 1 tablet (10 mg total) by mouth at bedtime. (Patient not taking: Reported on 06/25/2023) 90 tablet 3   torsemide (DEMADEX) 10  MG tablet Take 1 tablet (10 mg total) by mouth as needed. (Patient not taking: Reported on 05/30/2023) 30 tablet 3   triamcinolone (NASACORT) 55 MCG/ACT AERO nasal inhaler Place 2 sprays into the nose daily. (Patient not taking: Reported on 05/30/2023) 1 each 12   No current facility-administered medications on file prior to visit.        ROS:  All others reviewed and negative.  Objective        PE:  BP 136/72 (BP Location: Left Arm, Patient Position: Sitting, Cuff Size: Normal)   Pulse 64   Temp 98.5 F (36.9 C) (Oral)   Ht 5\' 10"  (1.778 m)   Wt 169 lb (76.7 kg)   SpO2 96%   BMI 24.25 kg/m                 Constitutional: Pt appears in NAD               HENT: Head: NCAT.                Right Ear: External ear normal.                 Left Ear: External ear normal.                Eyes: . Pupils are equal, round, and reactive to light. Conjunctivae and EOM are normal               Nose: without d/c or deformity               Neck: Neck supple. Gross normal ROM               Cardiovascular: Normal rate and regular rhythm.                 Pulmonary/Chest: Effort normal and breath sounds without rales or wheezing.                Abd:  Soft, NT, ND, + BS, no organomegaly               Neurological: Pt is alert. At baseline orientation, motor grossly intact               Skin: Skin is warm. No rashes, no other new lesions, LE edema - none               Psychiatric: Pt behavior is normal without agitation   Micro: none  Cardiac tracings I have personally interpreted today:  none  Pertinent Radiological findings (summarize): none   Lab Results  Component Value Date   WBC 4.1  05/30/2023   HGB 10.2 (L) 05/30/2023   HCT 30.7 (L) 05/30/2023   PLT 245.0 05/30/2023   GLUCOSE 96 05/30/2023   CHOL 134 10/08/2022   TRIG 56.0 10/08/2022   HDL 61.50 10/08/2022   LDLCALC 62 10/08/2022   ALT 19 05/30/2023   AST 22 05/30/2023   NA 139 05/30/2023   K 4.4 05/30/2023   CL 106 05/30/2023    CREATININE 1.20 05/30/2023   BUN 19 05/30/2023   CO2 29 05/30/2023   TSH 2.78 06/25/2023   PSA 2.00 01/25/2010   HGBA1C 6.5 10/08/2022   Assessment/Plan:  Ronald [name] is a 89 y.o. White or Caucasian [1] male with  has a past medical history of ALLERGIC RHINITIS (11/25/2007), ANXIETY (11/25/2007), COLONIC POLYPS, HX OF (11/25/2007), CORONARY ARTERY DISEASE (11/25/2007), Cough (01/04/2009), HEMORRHOIDS, INTERNAL (11/06/2010), HOARSENESS (01/04/2009), HYPERLIPIDEMIA (11/25/2007), HYPERTENSION (11/25/2007), Impaired glucose tolerance (04/21/2013), LOW BACK PAIN (11/25/2007), Lumbar radiculopathy, chronic, PEPTIC ULCER DISEASE (11/25/2007), Peroneal nerve lesion of lower extremity, bilateral (05/08/2017), Preventative health care (04/07/2011), PSA, INCREASED (01/05/2009), Right foot drop, Spasmodic dysphonia, and TREMOR, ESSENTIAL (11/25/2007).  Essential hypertension BP Readings from Last 3 Encounters:  08/26/23 136/72  06/25/23 (!) 118/58  05/30/23 (!) 120/56   Stable, pt to continue medical treatment inderall la 60 every day, micardis 40 qd   Depression With mild worsening, ok to restart celexa 20 qd  Allergic rhinitis Mild to mod, for also add nasacort asd,  to f/u any worsening symptoms or concerns  Debility Also for referral PT to help assist with general strengthening  Followup: Return in about 3 months (around 11/28/2023), or if symptoms worsen or fail to improve.  Oliver Barre, MD 08/26/2023 6:00 PM [facility]

## 2023-08-26 NOTE — Patient Instructions (Addendum)
Please continue all other medications as before, including the celexa 20 mg per day  Please have the pharmacy call with any other refills you may need.  Please continue your efforts at being more active, low cholesterol diet, and weight control.  Please keep your appointments with your specialists as you may have planned  You will be contacted regarding the referral for: Physical Therapy  We can hold on lab testing today  Please make an Appointment to return in Nov 28 2023, or sooner if needed

## 2023-08-26 NOTE — Assessment & Plan Note (Addendum)
Also for referral PT to help assist with general strengthening, declines further lab today

## 2023-08-27 ENCOUNTER — Telehealth: Payer: Self-pay | Admitting: Internal Medicine

## 2023-08-27 DIAGNOSIS — R269 Unspecified abnormalities of gait and mobility: Secondary | ICD-10-CM

## 2023-08-27 DIAGNOSIS — F03A4 Unspecified dementia, mild, with anxiety: Secondary | ICD-10-CM

## 2023-08-27 NOTE — Telephone Encounter (Signed)
Pt daughter called if she can get Physical therapy referral in home preferable through [facility]. Please advise  Darel Hong [phone]

## 2023-08-28 NOTE — Telephone Encounter (Signed)
Ok this is done 

## 2023-08-29 NOTE — Telephone Encounter (Signed)
Called and left voice mail

## 2023-09-01 NOTE — Telephone Encounter (Signed)
Ok forward to [facility] thanks

## 2023-09-01 NOTE — Telephone Encounter (Signed)
[facility] states that they have not received the referral that Dr. Jonny Ruiz had placed.  Please fax to [phone]

## 2023-09-09 DIAGNOSIS — G8929 Other chronic pain: Secondary | ICD-10-CM | POA: Diagnosis not present

## 2023-09-09 DIAGNOSIS — G5733 Lesion of lateral popliteal nerve, bilateral lower limbs: Secondary | ICD-10-CM | POA: Diagnosis not present

## 2023-09-09 DIAGNOSIS — R490 Dysphonia: Secondary | ICD-10-CM | POA: Diagnosis not present

## 2023-09-09 DIAGNOSIS — N3281 Overactive bladder: Secondary | ICD-10-CM | POA: Diagnosis not present

## 2023-09-09 DIAGNOSIS — G250 Essential tremor: Secondary | ICD-10-CM | POA: Diagnosis not present

## 2023-09-09 DIAGNOSIS — M21371 Foot drop, right foot: Secondary | ICD-10-CM | POA: Diagnosis not present

## 2023-09-09 DIAGNOSIS — Z951 Presence of aortocoronary bypass graft: Secondary | ICD-10-CM | POA: Diagnosis not present

## 2023-09-09 DIAGNOSIS — H9193 Unspecified hearing loss, bilateral: Secondary | ICD-10-CM | POA: Diagnosis not present

## 2023-09-09 DIAGNOSIS — Z981 Arthrodesis status: Secondary | ICD-10-CM | POA: Diagnosis not present

## 2023-09-09 DIAGNOSIS — I10 Essential (primary) hypertension: Secondary | ICD-10-CM | POA: Diagnosis not present

## 2023-09-09 DIAGNOSIS — I2510 Atherosclerotic heart disease of native coronary artery without angina pectoris: Secondary | ICD-10-CM | POA: Diagnosis not present

## 2023-09-09 DIAGNOSIS — K279 Peptic ulcer, site unspecified, unspecified as acute or chronic, without hemorrhage or perforation: Secondary | ICD-10-CM | POA: Diagnosis not present

## 2023-09-09 DIAGNOSIS — E785 Hyperlipidemia, unspecified: Secondary | ICD-10-CM | POA: Diagnosis not present

## 2023-09-09 DIAGNOSIS — M25512 Pain in left shoulder: Secondary | ICD-10-CM | POA: Diagnosis not present

## 2023-09-09 DIAGNOSIS — D692 Other nonthrombocytopenic purpura: Secondary | ICD-10-CM | POA: Diagnosis not present

## 2023-09-09 DIAGNOSIS — M5416 Radiculopathy, lumbar region: Secondary | ICD-10-CM | POA: Diagnosis not present

## 2023-09-09 DIAGNOSIS — Z7982 Long term (current) use of aspirin: Secondary | ICD-10-CM | POA: Diagnosis not present

## 2023-09-09 DIAGNOSIS — J309 Allergic rhinitis, unspecified: Secondary | ICD-10-CM | POA: Diagnosis not present

## 2023-09-09 DIAGNOSIS — M19019 Primary osteoarthritis, unspecified shoulder: Secondary | ICD-10-CM | POA: Diagnosis not present

## 2023-09-12 DIAGNOSIS — M19019 Primary osteoarthritis, unspecified shoulder: Secondary | ICD-10-CM | POA: Diagnosis not present

## 2023-09-12 DIAGNOSIS — K279 Peptic ulcer, site unspecified, unspecified as acute or chronic, without hemorrhage or perforation: Secondary | ICD-10-CM | POA: Diagnosis not present

## 2023-09-12 DIAGNOSIS — R490 Dysphonia: Secondary | ICD-10-CM | POA: Diagnosis not present

## 2023-09-12 DIAGNOSIS — G250 Essential tremor: Secondary | ICD-10-CM | POA: Diagnosis not present

## 2023-09-12 DIAGNOSIS — M25512 Pain in left shoulder: Secondary | ICD-10-CM | POA: Diagnosis not present

## 2023-09-12 DIAGNOSIS — H9193 Unspecified hearing loss, bilateral: Secondary | ICD-10-CM | POA: Diagnosis not present

## 2023-09-12 DIAGNOSIS — D692 Other nonthrombocytopenic purpura: Secondary | ICD-10-CM | POA: Diagnosis not present

## 2023-09-12 DIAGNOSIS — M21371 Foot drop, right foot: Secondary | ICD-10-CM | POA: Diagnosis not present

## 2023-09-12 DIAGNOSIS — Z7982 Long term (current) use of aspirin: Secondary | ICD-10-CM | POA: Diagnosis not present

## 2023-09-12 DIAGNOSIS — G8929 Other chronic pain: Secondary | ICD-10-CM | POA: Diagnosis not present

## 2023-09-12 DIAGNOSIS — E785 Hyperlipidemia, unspecified: Secondary | ICD-10-CM | POA: Diagnosis not present

## 2023-09-12 DIAGNOSIS — M5416 Radiculopathy, lumbar region: Secondary | ICD-10-CM | POA: Diagnosis not present

## 2023-09-12 DIAGNOSIS — I2510 Atherosclerotic heart disease of native coronary artery without angina pectoris: Secondary | ICD-10-CM | POA: Diagnosis not present

## 2023-09-12 DIAGNOSIS — Z951 Presence of aortocoronary bypass graft: Secondary | ICD-10-CM | POA: Diagnosis not present

## 2023-09-12 DIAGNOSIS — G5733 Lesion of lateral popliteal nerve, bilateral lower limbs: Secondary | ICD-10-CM | POA: Diagnosis not present

## 2023-09-12 DIAGNOSIS — I10 Essential (primary) hypertension: Secondary | ICD-10-CM | POA: Diagnosis not present

## 2023-09-12 DIAGNOSIS — N3281 Overactive bladder: Secondary | ICD-10-CM | POA: Diagnosis not present

## 2023-09-12 DIAGNOSIS — J309 Allergic rhinitis, unspecified: Secondary | ICD-10-CM | POA: Diagnosis not present

## 2023-09-12 DIAGNOSIS — Z981 Arthrodesis status: Secondary | ICD-10-CM | POA: Diagnosis not present

## 2023-09-22 ENCOUNTER — Telehealth: Payer: Self-pay

## 2023-09-22 DIAGNOSIS — M25512 Pain in left shoulder: Secondary | ICD-10-CM | POA: Diagnosis not present

## 2023-09-22 DIAGNOSIS — J309 Allergic rhinitis, unspecified: Secondary | ICD-10-CM | POA: Diagnosis not present

## 2023-09-22 DIAGNOSIS — I2510 Atherosclerotic heart disease of native coronary artery without angina pectoris: Secondary | ICD-10-CM | POA: Diagnosis not present

## 2023-09-22 DIAGNOSIS — R490 Dysphonia: Secondary | ICD-10-CM | POA: Diagnosis not present

## 2023-09-22 DIAGNOSIS — K279 Peptic ulcer, site unspecified, unspecified as acute or chronic, without hemorrhage or perforation: Secondary | ICD-10-CM | POA: Diagnosis not present

## 2023-09-22 DIAGNOSIS — Z7982 Long term (current) use of aspirin: Secondary | ICD-10-CM | POA: Diagnosis not present

## 2023-09-22 DIAGNOSIS — G5733 Lesion of lateral popliteal nerve, bilateral lower limbs: Secondary | ICD-10-CM | POA: Diagnosis not present

## 2023-09-22 DIAGNOSIS — E785 Hyperlipidemia, unspecified: Secondary | ICD-10-CM | POA: Diagnosis not present

## 2023-09-22 DIAGNOSIS — M21371 Foot drop, right foot: Secondary | ICD-10-CM | POA: Diagnosis not present

## 2023-09-22 DIAGNOSIS — D692 Other nonthrombocytopenic purpura: Secondary | ICD-10-CM | POA: Diagnosis not present

## 2023-09-22 DIAGNOSIS — M5416 Radiculopathy, lumbar region: Secondary | ICD-10-CM | POA: Diagnosis not present

## 2023-09-22 DIAGNOSIS — Z951 Presence of aortocoronary bypass graft: Secondary | ICD-10-CM | POA: Diagnosis not present

## 2023-09-22 DIAGNOSIS — N3281 Overactive bladder: Secondary | ICD-10-CM | POA: Diagnosis not present

## 2023-09-22 DIAGNOSIS — M19019 Primary osteoarthritis, unspecified shoulder: Secondary | ICD-10-CM | POA: Diagnosis not present

## 2023-09-22 DIAGNOSIS — Z981 Arthrodesis status: Secondary | ICD-10-CM | POA: Diagnosis not present

## 2023-09-22 DIAGNOSIS — H9193 Unspecified hearing loss, bilateral: Secondary | ICD-10-CM | POA: Diagnosis not present

## 2023-09-22 DIAGNOSIS — G250 Essential tremor: Secondary | ICD-10-CM | POA: Diagnosis not present

## 2023-09-22 DIAGNOSIS — I10 Essential (primary) hypertension: Secondary | ICD-10-CM | POA: Diagnosis not present

## 2023-09-22 DIAGNOSIS — G8929 Other chronic pain: Secondary | ICD-10-CM | POA: Diagnosis not present

## 2023-09-22 NOTE — Telephone Encounter (Signed)
 Copied from CRM [id number]. Topic: General - Other >> Sep 22, 2023  3:50 PM Susanna ORN wrote: Reason for CRM: Alan, with [facility], called to report that patient had a low heart rate of 56 while she saw him for physical therapy today. She stated he was non-symptomatic.

## 2023-09-22 NOTE — Telephone Encounter (Signed)
 Ok to follow at this time

## 2023-09-24 DIAGNOSIS — I10 Essential (primary) hypertension: Secondary | ICD-10-CM | POA: Diagnosis not present

## 2023-09-24 DIAGNOSIS — G250 Essential tremor: Secondary | ICD-10-CM | POA: Diagnosis not present

## 2023-09-24 DIAGNOSIS — G5733 Lesion of lateral popliteal nerve, bilateral lower limbs: Secondary | ICD-10-CM | POA: Diagnosis not present

## 2023-09-24 DIAGNOSIS — M19019 Primary osteoarthritis, unspecified shoulder: Secondary | ICD-10-CM | POA: Diagnosis not present

## 2023-09-24 DIAGNOSIS — M25512 Pain in left shoulder: Secondary | ICD-10-CM | POA: Diagnosis not present

## 2023-09-24 DIAGNOSIS — H9193 Unspecified hearing loss, bilateral: Secondary | ICD-10-CM | POA: Diagnosis not present

## 2023-09-24 DIAGNOSIS — K279 Peptic ulcer, site unspecified, unspecified as acute or chronic, without hemorrhage or perforation: Secondary | ICD-10-CM | POA: Diagnosis not present

## 2023-09-24 DIAGNOSIS — N3281 Overactive bladder: Secondary | ICD-10-CM | POA: Diagnosis not present

## 2023-09-24 DIAGNOSIS — J309 Allergic rhinitis, unspecified: Secondary | ICD-10-CM | POA: Diagnosis not present

## 2023-09-24 DIAGNOSIS — Z981 Arthrodesis status: Secondary | ICD-10-CM | POA: Diagnosis not present

## 2023-09-24 DIAGNOSIS — R490 Dysphonia: Secondary | ICD-10-CM | POA: Diagnosis not present

## 2023-09-24 DIAGNOSIS — I2510 Atherosclerotic heart disease of native coronary artery without angina pectoris: Secondary | ICD-10-CM | POA: Diagnosis not present

## 2023-09-24 DIAGNOSIS — E785 Hyperlipidemia, unspecified: Secondary | ICD-10-CM | POA: Diagnosis not present

## 2023-09-24 DIAGNOSIS — M21371 Foot drop, right foot: Secondary | ICD-10-CM | POA: Diagnosis not present

## 2023-09-24 DIAGNOSIS — Z951 Presence of aortocoronary bypass graft: Secondary | ICD-10-CM | POA: Diagnosis not present

## 2023-09-24 DIAGNOSIS — G8929 Other chronic pain: Secondary | ICD-10-CM | POA: Diagnosis not present

## 2023-09-24 DIAGNOSIS — Z7982 Long term (current) use of aspirin: Secondary | ICD-10-CM | POA: Diagnosis not present

## 2023-09-24 DIAGNOSIS — D692 Other nonthrombocytopenic purpura: Secondary | ICD-10-CM | POA: Diagnosis not present

## 2023-09-24 DIAGNOSIS — M5416 Radiculopathy, lumbar region: Secondary | ICD-10-CM | POA: Diagnosis not present

## 2023-09-25 DIAGNOSIS — H5213 Myopia, bilateral: Secondary | ICD-10-CM | POA: Diagnosis not present

## 2023-09-25 DIAGNOSIS — H2513 Age-related nuclear cataract, bilateral: Secondary | ICD-10-CM | POA: Diagnosis not present

## 2023-09-25 DIAGNOSIS — H52223 Regular astigmatism, bilateral: Secondary | ICD-10-CM | POA: Diagnosis not present

## 2023-09-25 DIAGNOSIS — H524 Presbyopia: Secondary | ICD-10-CM | POA: Diagnosis not present

## 2023-09-26 ENCOUNTER — Other Ambulatory Visit: Payer: Self-pay | Admitting: Internal Medicine

## 2023-09-26 ENCOUNTER — Other Ambulatory Visit: Payer: Self-pay

## 2023-09-26 DIAGNOSIS — G5733 Lesion of lateral popliteal nerve, bilateral lower limbs: Secondary | ICD-10-CM | POA: Diagnosis not present

## 2023-09-26 DIAGNOSIS — K279 Peptic ulcer, site unspecified, unspecified as acute or chronic, without hemorrhage or perforation: Secondary | ICD-10-CM | POA: Diagnosis not present

## 2023-09-26 DIAGNOSIS — G250 Essential tremor: Secondary | ICD-10-CM | POA: Diagnosis not present

## 2023-09-26 DIAGNOSIS — G8929 Other chronic pain: Secondary | ICD-10-CM | POA: Diagnosis not present

## 2023-09-26 DIAGNOSIS — J309 Allergic rhinitis, unspecified: Secondary | ICD-10-CM | POA: Diagnosis not present

## 2023-09-26 DIAGNOSIS — M21371 Foot drop, right foot: Secondary | ICD-10-CM | POA: Diagnosis not present

## 2023-09-26 DIAGNOSIS — R490 Dysphonia: Secondary | ICD-10-CM | POA: Diagnosis not present

## 2023-09-26 DIAGNOSIS — M5416 Radiculopathy, lumbar region: Secondary | ICD-10-CM | POA: Diagnosis not present

## 2023-09-26 DIAGNOSIS — D692 Other nonthrombocytopenic purpura: Secondary | ICD-10-CM | POA: Diagnosis not present

## 2023-09-26 DIAGNOSIS — Z7982 Long term (current) use of aspirin: Secondary | ICD-10-CM | POA: Diagnosis not present

## 2023-09-26 DIAGNOSIS — M25512 Pain in left shoulder: Secondary | ICD-10-CM | POA: Diagnosis not present

## 2023-09-26 DIAGNOSIS — M19019 Primary osteoarthritis, unspecified shoulder: Secondary | ICD-10-CM | POA: Diagnosis not present

## 2023-09-26 DIAGNOSIS — E785 Hyperlipidemia, unspecified: Secondary | ICD-10-CM | POA: Diagnosis not present

## 2023-09-26 DIAGNOSIS — Z981 Arthrodesis status: Secondary | ICD-10-CM | POA: Diagnosis not present

## 2023-09-26 DIAGNOSIS — N3281 Overactive bladder: Secondary | ICD-10-CM | POA: Diagnosis not present

## 2023-09-26 DIAGNOSIS — I2510 Atherosclerotic heart disease of native coronary artery without angina pectoris: Secondary | ICD-10-CM | POA: Diagnosis not present

## 2023-09-26 DIAGNOSIS — I10 Essential (primary) hypertension: Secondary | ICD-10-CM | POA: Diagnosis not present

## 2023-09-26 DIAGNOSIS — Z951 Presence of aortocoronary bypass graft: Secondary | ICD-10-CM | POA: Diagnosis not present

## 2023-09-26 DIAGNOSIS — H9193 Unspecified hearing loss, bilateral: Secondary | ICD-10-CM | POA: Diagnosis not present

## 2023-09-30 DIAGNOSIS — M21371 Foot drop, right foot: Secondary | ICD-10-CM | POA: Diagnosis not present

## 2023-09-30 DIAGNOSIS — I10 Essential (primary) hypertension: Secondary | ICD-10-CM | POA: Diagnosis not present

## 2023-09-30 DIAGNOSIS — Z981 Arthrodesis status: Secondary | ICD-10-CM | POA: Diagnosis not present

## 2023-09-30 DIAGNOSIS — G8929 Other chronic pain: Secondary | ICD-10-CM | POA: Diagnosis not present

## 2023-09-30 DIAGNOSIS — R490 Dysphonia: Secondary | ICD-10-CM | POA: Diagnosis not present

## 2023-09-30 DIAGNOSIS — G5733 Lesion of lateral popliteal nerve, bilateral lower limbs: Secondary | ICD-10-CM | POA: Diagnosis not present

## 2023-09-30 DIAGNOSIS — Z7982 Long term (current) use of aspirin: Secondary | ICD-10-CM | POA: Diagnosis not present

## 2023-09-30 DIAGNOSIS — H9193 Unspecified hearing loss, bilateral: Secondary | ICD-10-CM | POA: Diagnosis not present

## 2023-09-30 DIAGNOSIS — K279 Peptic ulcer, site unspecified, unspecified as acute or chronic, without hemorrhage or perforation: Secondary | ICD-10-CM | POA: Diagnosis not present

## 2023-09-30 DIAGNOSIS — G250 Essential tremor: Secondary | ICD-10-CM | POA: Diagnosis not present

## 2023-09-30 DIAGNOSIS — M25512 Pain in left shoulder: Secondary | ICD-10-CM | POA: Diagnosis not present

## 2023-09-30 DIAGNOSIS — J309 Allergic rhinitis, unspecified: Secondary | ICD-10-CM | POA: Diagnosis not present

## 2023-09-30 DIAGNOSIS — M19019 Primary osteoarthritis, unspecified shoulder: Secondary | ICD-10-CM | POA: Diagnosis not present

## 2023-09-30 DIAGNOSIS — I2510 Atherosclerotic heart disease of native coronary artery without angina pectoris: Secondary | ICD-10-CM | POA: Diagnosis not present

## 2023-09-30 DIAGNOSIS — Z951 Presence of aortocoronary bypass graft: Secondary | ICD-10-CM | POA: Diagnosis not present

## 2023-09-30 DIAGNOSIS — M5416 Radiculopathy, lumbar region: Secondary | ICD-10-CM | POA: Diagnosis not present

## 2023-09-30 DIAGNOSIS — E785 Hyperlipidemia, unspecified: Secondary | ICD-10-CM | POA: Diagnosis not present

## 2023-09-30 DIAGNOSIS — D692 Other nonthrombocytopenic purpura: Secondary | ICD-10-CM | POA: Diagnosis not present

## 2023-09-30 DIAGNOSIS — N3281 Overactive bladder: Secondary | ICD-10-CM | POA: Diagnosis not present

## 2023-10-01 DIAGNOSIS — M5416 Radiculopathy, lumbar region: Secondary | ICD-10-CM | POA: Diagnosis not present

## 2023-10-01 DIAGNOSIS — G5733 Lesion of lateral popliteal nerve, bilateral lower limbs: Secondary | ICD-10-CM | POA: Diagnosis not present

## 2023-10-01 DIAGNOSIS — Z951 Presence of aortocoronary bypass graft: Secondary | ICD-10-CM | POA: Diagnosis not present

## 2023-10-01 DIAGNOSIS — R490 Dysphonia: Secondary | ICD-10-CM | POA: Diagnosis not present

## 2023-10-01 DIAGNOSIS — H9193 Unspecified hearing loss, bilateral: Secondary | ICD-10-CM | POA: Diagnosis not present

## 2023-10-01 DIAGNOSIS — M25512 Pain in left shoulder: Secondary | ICD-10-CM | POA: Diagnosis not present

## 2023-10-01 DIAGNOSIS — G250 Essential tremor: Secondary | ICD-10-CM | POA: Diagnosis not present

## 2023-10-01 DIAGNOSIS — N3281 Overactive bladder: Secondary | ICD-10-CM | POA: Diagnosis not present

## 2023-10-01 DIAGNOSIS — K279 Peptic ulcer, site unspecified, unspecified as acute or chronic, without hemorrhage or perforation: Secondary | ICD-10-CM | POA: Diagnosis not present

## 2023-10-01 DIAGNOSIS — Z981 Arthrodesis status: Secondary | ICD-10-CM | POA: Diagnosis not present

## 2023-10-01 DIAGNOSIS — I2510 Atherosclerotic heart disease of native coronary artery without angina pectoris: Secondary | ICD-10-CM | POA: Diagnosis not present

## 2023-10-01 DIAGNOSIS — J309 Allergic rhinitis, unspecified: Secondary | ICD-10-CM | POA: Diagnosis not present

## 2023-10-01 DIAGNOSIS — G8929 Other chronic pain: Secondary | ICD-10-CM | POA: Diagnosis not present

## 2023-10-01 DIAGNOSIS — D692 Other nonthrombocytopenic purpura: Secondary | ICD-10-CM | POA: Diagnosis not present

## 2023-10-01 DIAGNOSIS — E785 Hyperlipidemia, unspecified: Secondary | ICD-10-CM | POA: Diagnosis not present

## 2023-10-01 DIAGNOSIS — I10 Essential (primary) hypertension: Secondary | ICD-10-CM | POA: Diagnosis not present

## 2023-10-01 DIAGNOSIS — Z7982 Long term (current) use of aspirin: Secondary | ICD-10-CM | POA: Diagnosis not present

## 2023-10-01 DIAGNOSIS — M21371 Foot drop, right foot: Secondary | ICD-10-CM | POA: Diagnosis not present

## 2023-10-01 DIAGNOSIS — M19019 Primary osteoarthritis, unspecified shoulder: Secondary | ICD-10-CM | POA: Diagnosis not present

## 2023-10-02 ENCOUNTER — Telehealth: Payer: Self-pay

## 2023-10-02 DIAGNOSIS — N3281 Overactive bladder: Secondary | ICD-10-CM | POA: Diagnosis not present

## 2023-10-02 DIAGNOSIS — K279 Peptic ulcer, site unspecified, unspecified as acute or chronic, without hemorrhage or perforation: Secondary | ICD-10-CM | POA: Diagnosis not present

## 2023-10-02 DIAGNOSIS — G8929 Other chronic pain: Secondary | ICD-10-CM | POA: Diagnosis not present

## 2023-10-02 DIAGNOSIS — J309 Allergic rhinitis, unspecified: Secondary | ICD-10-CM | POA: Diagnosis not present

## 2023-10-02 DIAGNOSIS — I10 Essential (primary) hypertension: Secondary | ICD-10-CM | POA: Diagnosis not present

## 2023-10-02 DIAGNOSIS — M5416 Radiculopathy, lumbar region: Secondary | ICD-10-CM | POA: Diagnosis not present

## 2023-10-02 DIAGNOSIS — Z7982 Long term (current) use of aspirin: Secondary | ICD-10-CM | POA: Diagnosis not present

## 2023-10-02 DIAGNOSIS — M19019 Primary osteoarthritis, unspecified shoulder: Secondary | ICD-10-CM | POA: Diagnosis not present

## 2023-10-02 DIAGNOSIS — M25512 Pain in left shoulder: Secondary | ICD-10-CM | POA: Diagnosis not present

## 2023-10-02 DIAGNOSIS — M21371 Foot drop, right foot: Secondary | ICD-10-CM | POA: Diagnosis not present

## 2023-10-02 DIAGNOSIS — H35361 Drusen (degenerative) of macula, right eye: Secondary | ICD-10-CM | POA: Diagnosis not present

## 2023-10-02 DIAGNOSIS — R490 Dysphonia: Secondary | ICD-10-CM | POA: Diagnosis not present

## 2023-10-02 DIAGNOSIS — Z981 Arthrodesis status: Secondary | ICD-10-CM | POA: Diagnosis not present

## 2023-10-02 DIAGNOSIS — Z951 Presence of aortocoronary bypass graft: Secondary | ICD-10-CM | POA: Diagnosis not present

## 2023-10-02 DIAGNOSIS — H35373 Puckering of macula, bilateral: Secondary | ICD-10-CM | POA: Diagnosis not present

## 2023-10-02 DIAGNOSIS — G250 Essential tremor: Secondary | ICD-10-CM | POA: Diagnosis not present

## 2023-10-02 DIAGNOSIS — I2510 Atherosclerotic heart disease of native coronary artery without angina pectoris: Secondary | ICD-10-CM | POA: Diagnosis not present

## 2023-10-02 DIAGNOSIS — H25813 Combined forms of age-related cataract, bilateral: Secondary | ICD-10-CM | POA: Diagnosis not present

## 2023-10-02 DIAGNOSIS — D692 Other nonthrombocytopenic purpura: Secondary | ICD-10-CM | POA: Diagnosis not present

## 2023-10-02 DIAGNOSIS — G5733 Lesion of lateral popliteal nerve, bilateral lower limbs: Secondary | ICD-10-CM | POA: Diagnosis not present

## 2023-10-02 DIAGNOSIS — E785 Hyperlipidemia, unspecified: Secondary | ICD-10-CM | POA: Diagnosis not present

## 2023-10-02 DIAGNOSIS — H1132 Conjunctival hemorrhage, left eye: Secondary | ICD-10-CM | POA: Diagnosis not present

## 2023-10-02 DIAGNOSIS — H9193 Unspecified hearing loss, bilateral: Secondary | ICD-10-CM | POA: Diagnosis not present

## 2023-10-02 NOTE — Telephone Encounter (Signed)
Please to contact pt family or caretaker as pt has dementia  Pt should apply Vaseline and gauze covering to the skin tears as able to allow a covering and give the skin a chance to heal, this may take over a week or two to heal  Please go to [facility] if there are any large superficial skin flaps that need to be removed and the wounds cleaned

## 2023-10-02 NOTE — Telephone Encounter (Signed)
Copied from CRM [id number]. Topic: General - Other >> Oct 02, 2023  2:13 PM Truddie Crumble wrote: Reason for CRM: Marchelle Folks from [facility] called wanting to report a fall the pt had today. Pt lost his balance while taking the garbage out. Pt has two small skin tears and one large skin tear on his right arm. Pt declined putting anything on it, but he washed it making sure the bleeding stop. Pt also hit his head on some shelving this afternoon but Marchelle Folks stated there was no bruising or pain. Pt went to the eye doctor and had eyes dilated so Marchelle Folks thinks the pt may have missed where things were. Pt also had bleeding in his left eye due to eye doctor appointment  CB [phone]

## 2023-10-03 NOTE — Telephone Encounter (Signed)
Called and left voice mail

## 2023-10-07 DIAGNOSIS — I10 Essential (primary) hypertension: Secondary | ICD-10-CM | POA: Diagnosis not present

## 2023-10-07 DIAGNOSIS — N3281 Overactive bladder: Secondary | ICD-10-CM | POA: Diagnosis not present

## 2023-10-07 DIAGNOSIS — H9193 Unspecified hearing loss, bilateral: Secondary | ICD-10-CM | POA: Diagnosis not present

## 2023-10-07 DIAGNOSIS — R490 Dysphonia: Secondary | ICD-10-CM | POA: Diagnosis not present

## 2023-10-07 DIAGNOSIS — Z7982 Long term (current) use of aspirin: Secondary | ICD-10-CM | POA: Diagnosis not present

## 2023-10-07 DIAGNOSIS — M5416 Radiculopathy, lumbar region: Secondary | ICD-10-CM | POA: Diagnosis not present

## 2023-10-07 DIAGNOSIS — D692 Other nonthrombocytopenic purpura: Secondary | ICD-10-CM | POA: Diagnosis not present

## 2023-10-07 DIAGNOSIS — M19019 Primary osteoarthritis, unspecified shoulder: Secondary | ICD-10-CM | POA: Diagnosis not present

## 2023-10-07 DIAGNOSIS — J309 Allergic rhinitis, unspecified: Secondary | ICD-10-CM | POA: Diagnosis not present

## 2023-10-07 DIAGNOSIS — M25512 Pain in left shoulder: Secondary | ICD-10-CM | POA: Diagnosis not present

## 2023-10-07 DIAGNOSIS — K279 Peptic ulcer, site unspecified, unspecified as acute or chronic, without hemorrhage or perforation: Secondary | ICD-10-CM | POA: Diagnosis not present

## 2023-10-07 DIAGNOSIS — G8929 Other chronic pain: Secondary | ICD-10-CM | POA: Diagnosis not present

## 2023-10-07 DIAGNOSIS — Z951 Presence of aortocoronary bypass graft: Secondary | ICD-10-CM | POA: Diagnosis not present

## 2023-10-07 DIAGNOSIS — E785 Hyperlipidemia, unspecified: Secondary | ICD-10-CM | POA: Diagnosis not present

## 2023-10-07 DIAGNOSIS — G5733 Lesion of lateral popliteal nerve, bilateral lower limbs: Secondary | ICD-10-CM | POA: Diagnosis not present

## 2023-10-07 DIAGNOSIS — I2510 Atherosclerotic heart disease of native coronary artery without angina pectoris: Secondary | ICD-10-CM | POA: Diagnosis not present

## 2023-10-07 DIAGNOSIS — Z981 Arthrodesis status: Secondary | ICD-10-CM | POA: Diagnosis not present

## 2023-10-07 DIAGNOSIS — G250 Essential tremor: Secondary | ICD-10-CM | POA: Diagnosis not present

## 2023-10-07 DIAGNOSIS — M21371 Foot drop, right foot: Secondary | ICD-10-CM | POA: Diagnosis not present

## 2023-10-08 ENCOUNTER — Telehealth: Payer: Self-pay | Admitting: Internal Medicine

## 2023-10-08 ENCOUNTER — Other Ambulatory Visit: Payer: Self-pay

## 2023-10-08 ENCOUNTER — Other Ambulatory Visit: Payer: Self-pay | Admitting: Internal Medicine

## 2023-10-08 DIAGNOSIS — D692 Other nonthrombocytopenic purpura: Secondary | ICD-10-CM | POA: Diagnosis not present

## 2023-10-08 DIAGNOSIS — J309 Allergic rhinitis, unspecified: Secondary | ICD-10-CM | POA: Diagnosis not present

## 2023-10-08 DIAGNOSIS — I10 Essential (primary) hypertension: Secondary | ICD-10-CM | POA: Diagnosis not present

## 2023-10-08 DIAGNOSIS — E785 Hyperlipidemia, unspecified: Secondary | ICD-10-CM | POA: Diagnosis not present

## 2023-10-08 DIAGNOSIS — I2510 Atherosclerotic heart disease of native coronary artery without angina pectoris: Secondary | ICD-10-CM | POA: Diagnosis not present

## 2023-10-08 DIAGNOSIS — Z951 Presence of aortocoronary bypass graft: Secondary | ICD-10-CM | POA: Diagnosis not present

## 2023-10-08 DIAGNOSIS — M5416 Radiculopathy, lumbar region: Secondary | ICD-10-CM | POA: Diagnosis not present

## 2023-10-08 DIAGNOSIS — G8929 Other chronic pain: Secondary | ICD-10-CM | POA: Diagnosis not present

## 2023-10-08 DIAGNOSIS — Z7982 Long term (current) use of aspirin: Secondary | ICD-10-CM | POA: Diagnosis not present

## 2023-10-08 DIAGNOSIS — M21371 Foot drop, right foot: Secondary | ICD-10-CM | POA: Diagnosis not present

## 2023-10-08 DIAGNOSIS — G5733 Lesion of lateral popliteal nerve, bilateral lower limbs: Secondary | ICD-10-CM | POA: Diagnosis not present

## 2023-10-08 DIAGNOSIS — H9193 Unspecified hearing loss, bilateral: Secondary | ICD-10-CM | POA: Diagnosis not present

## 2023-10-08 DIAGNOSIS — Z981 Arthrodesis status: Secondary | ICD-10-CM | POA: Diagnosis not present

## 2023-10-08 DIAGNOSIS — N3281 Overactive bladder: Secondary | ICD-10-CM | POA: Diagnosis not present

## 2023-10-08 DIAGNOSIS — M25512 Pain in left shoulder: Secondary | ICD-10-CM | POA: Diagnosis not present

## 2023-10-08 DIAGNOSIS — R490 Dysphonia: Secondary | ICD-10-CM | POA: Diagnosis not present

## 2023-10-08 DIAGNOSIS — G250 Essential tremor: Secondary | ICD-10-CM | POA: Diagnosis not present

## 2023-10-08 DIAGNOSIS — K279 Peptic ulcer, site unspecified, unspecified as acute or chronic, without hemorrhage or perforation: Secondary | ICD-10-CM | POA: Diagnosis not present

## 2023-10-08 DIAGNOSIS — M19019 Primary osteoarthritis, unspecified shoulder: Secondary | ICD-10-CM | POA: Diagnosis not present

## 2023-10-08 MED ORDER — DONEPEZIL HCL 10 MG PO TABS: 10.0000 mg | ORAL_TABLET | Freq: Every day | ORAL | 3 refills | Status: DC

## 2023-10-08 NOTE — Telephone Encounter (Signed)
As needed for leg swelling only, thanks

## 2023-10-08 NOTE — Telephone Encounter (Signed)
Copied from CRM [id number]. Topic: Clinical - Prescription Issue >> Oct 08, 2023  1:31 PM Efraim Kaufmann C wrote: Reason for CRM: Melida Gimenez from [facility] called regarding patient's torsemide (DEMADEX) 10 MG tablet- it states "as needed" and she needs clarification on that.   Agent is also issuing a refill request for donepezil (ARICEPT) 10 MG tablet because Victorino Dike also stated that looking over his med list versus what he has, he does not have that medication.   Callback number for Victorino Dike at [facility] [phone]

## 2023-10-08 NOTE — Telephone Encounter (Unsigned)
Copied from CRM [id number]. Topic: Clinical - Medication Refill >> Oct 08, 2023  1:39 PM Efraim Kaufmann C wrote: Most Recent Primary Care Visit:  Provider: [name]  Department: [facility]  Visit Type: OFFICE VISIT  Date: 08/26/2023  Medication: donepezil (ARICEPT) 10 MG tablet   Has the patient contacted their pharmacy? No (informed by Home Health nurse) (Agent: If no, request that the patient contact the pharmacy for the refill. If patient does not wish to contact the pharmacy document the reason why and proceed with request.) (Agent: If yes, when and what did the pharmacy advise?)  Is this the correct pharmacy for this prescription? Yes If no, delete pharmacy and type the correct one.  This is the patient's preferred pharmacy:  [facility] [address] [place], [place] - [address] [place] [address] Phone: [phone] Fax: [phone]   Has the prescription been filled recently? No  Is the patient out of the medication? Yes  Has the patient been seen for an appointment in the last year OR does the patient have an upcoming appointment? Yes  Can we respond through MyChart? Yes  Agent: Please be advised that Rx refills may take up to 3 business days. We ask that you follow-up with your pharmacy.

## 2023-10-09 DIAGNOSIS — R490 Dysphonia: Secondary | ICD-10-CM | POA: Diagnosis not present

## 2023-10-09 DIAGNOSIS — J309 Allergic rhinitis, unspecified: Secondary | ICD-10-CM | POA: Diagnosis not present

## 2023-10-09 DIAGNOSIS — Z951 Presence of aortocoronary bypass graft: Secondary | ICD-10-CM | POA: Diagnosis not present

## 2023-10-09 DIAGNOSIS — G5733 Lesion of lateral popliteal nerve, bilateral lower limbs: Secondary | ICD-10-CM | POA: Diagnosis not present

## 2023-10-09 DIAGNOSIS — M21371 Foot drop, right foot: Secondary | ICD-10-CM | POA: Diagnosis not present

## 2023-10-09 DIAGNOSIS — K279 Peptic ulcer, site unspecified, unspecified as acute or chronic, without hemorrhage or perforation: Secondary | ICD-10-CM | POA: Diagnosis not present

## 2023-10-09 DIAGNOSIS — H9193 Unspecified hearing loss, bilateral: Secondary | ICD-10-CM | POA: Diagnosis not present

## 2023-10-09 DIAGNOSIS — G250 Essential tremor: Secondary | ICD-10-CM | POA: Diagnosis not present

## 2023-10-09 DIAGNOSIS — M25512 Pain in left shoulder: Secondary | ICD-10-CM | POA: Diagnosis not present

## 2023-10-09 DIAGNOSIS — D692 Other nonthrombocytopenic purpura: Secondary | ICD-10-CM | POA: Diagnosis not present

## 2023-10-09 DIAGNOSIS — Z7982 Long term (current) use of aspirin: Secondary | ICD-10-CM | POA: Diagnosis not present

## 2023-10-09 DIAGNOSIS — M19019 Primary osteoarthritis, unspecified shoulder: Secondary | ICD-10-CM | POA: Diagnosis not present

## 2023-10-09 DIAGNOSIS — M5416 Radiculopathy, lumbar region: Secondary | ICD-10-CM | POA: Diagnosis not present

## 2023-10-09 DIAGNOSIS — N3281 Overactive bladder: Secondary | ICD-10-CM | POA: Diagnosis not present

## 2023-10-09 DIAGNOSIS — Z981 Arthrodesis status: Secondary | ICD-10-CM | POA: Diagnosis not present

## 2023-10-09 DIAGNOSIS — I2510 Atherosclerotic heart disease of native coronary artery without angina pectoris: Secondary | ICD-10-CM | POA: Diagnosis not present

## 2023-10-09 DIAGNOSIS — E785 Hyperlipidemia, unspecified: Secondary | ICD-10-CM | POA: Diagnosis not present

## 2023-10-09 DIAGNOSIS — G8929 Other chronic pain: Secondary | ICD-10-CM | POA: Diagnosis not present

## 2023-10-09 DIAGNOSIS — I10 Essential (primary) hypertension: Secondary | ICD-10-CM | POA: Diagnosis not present

## 2023-10-09 NOTE — Telephone Encounter (Signed)
Called and let her know

## 2023-10-14 DIAGNOSIS — M21371 Foot drop, right foot: Secondary | ICD-10-CM | POA: Diagnosis not present

## 2023-10-14 DIAGNOSIS — D692 Other nonthrombocytopenic purpura: Secondary | ICD-10-CM | POA: Diagnosis not present

## 2023-10-14 DIAGNOSIS — M19019 Primary osteoarthritis, unspecified shoulder: Secondary | ICD-10-CM | POA: Diagnosis not present

## 2023-10-14 DIAGNOSIS — Z981 Arthrodesis status: Secondary | ICD-10-CM | POA: Diagnosis not present

## 2023-10-14 DIAGNOSIS — I2510 Atherosclerotic heart disease of native coronary artery without angina pectoris: Secondary | ICD-10-CM | POA: Diagnosis not present

## 2023-10-14 DIAGNOSIS — N3281 Overactive bladder: Secondary | ICD-10-CM | POA: Diagnosis not present

## 2023-10-14 DIAGNOSIS — R490 Dysphonia: Secondary | ICD-10-CM | POA: Diagnosis not present

## 2023-10-14 DIAGNOSIS — H25811 Combined forms of age-related cataract, right eye: Secondary | ICD-10-CM | POA: Diagnosis not present

## 2023-10-14 DIAGNOSIS — G250 Essential tremor: Secondary | ICD-10-CM | POA: Diagnosis not present

## 2023-10-14 DIAGNOSIS — Z951 Presence of aortocoronary bypass graft: Secondary | ICD-10-CM | POA: Diagnosis not present

## 2023-10-14 DIAGNOSIS — E785 Hyperlipidemia, unspecified: Secondary | ICD-10-CM | POA: Diagnosis not present

## 2023-10-14 DIAGNOSIS — Z7982 Long term (current) use of aspirin: Secondary | ICD-10-CM | POA: Diagnosis not present

## 2023-10-14 DIAGNOSIS — M5416 Radiculopathy, lumbar region: Secondary | ICD-10-CM | POA: Diagnosis not present

## 2023-10-14 DIAGNOSIS — J309 Allergic rhinitis, unspecified: Secondary | ICD-10-CM | POA: Diagnosis not present

## 2023-10-14 DIAGNOSIS — I10 Essential (primary) hypertension: Secondary | ICD-10-CM | POA: Diagnosis not present

## 2023-10-14 DIAGNOSIS — G8929 Other chronic pain: Secondary | ICD-10-CM | POA: Diagnosis not present

## 2023-10-14 DIAGNOSIS — M25512 Pain in left shoulder: Secondary | ICD-10-CM | POA: Diagnosis not present

## 2023-10-14 DIAGNOSIS — K279 Peptic ulcer, site unspecified, unspecified as acute or chronic, without hemorrhage or perforation: Secondary | ICD-10-CM | POA: Diagnosis not present

## 2023-10-14 DIAGNOSIS — H9193 Unspecified hearing loss, bilateral: Secondary | ICD-10-CM | POA: Diagnosis not present

## 2023-10-14 DIAGNOSIS — G5733 Lesion of lateral popliteal nerve, bilateral lower limbs: Secondary | ICD-10-CM | POA: Diagnosis not present

## 2023-10-15 ENCOUNTER — Telehealth: Payer: Self-pay | Admitting: Internal Medicine

## 2023-10-15 DIAGNOSIS — G8929 Other chronic pain: Secondary | ICD-10-CM | POA: Diagnosis not present

## 2023-10-15 DIAGNOSIS — M25512 Pain in left shoulder: Secondary | ICD-10-CM | POA: Diagnosis not present

## 2023-10-15 DIAGNOSIS — Z951 Presence of aortocoronary bypass graft: Secondary | ICD-10-CM | POA: Diagnosis not present

## 2023-10-15 DIAGNOSIS — Z7982 Long term (current) use of aspirin: Secondary | ICD-10-CM | POA: Diagnosis not present

## 2023-10-15 DIAGNOSIS — K279 Peptic ulcer, site unspecified, unspecified as acute or chronic, without hemorrhage or perforation: Secondary | ICD-10-CM | POA: Diagnosis not present

## 2023-10-15 DIAGNOSIS — J309 Allergic rhinitis, unspecified: Secondary | ICD-10-CM | POA: Diagnosis not present

## 2023-10-15 DIAGNOSIS — E785 Hyperlipidemia, unspecified: Secondary | ICD-10-CM | POA: Diagnosis not present

## 2023-10-15 DIAGNOSIS — I10 Essential (primary) hypertension: Secondary | ICD-10-CM | POA: Diagnosis not present

## 2023-10-15 DIAGNOSIS — G250 Essential tremor: Secondary | ICD-10-CM | POA: Diagnosis not present

## 2023-10-15 DIAGNOSIS — M19019 Primary osteoarthritis, unspecified shoulder: Secondary | ICD-10-CM | POA: Diagnosis not present

## 2023-10-15 DIAGNOSIS — N3281 Overactive bladder: Secondary | ICD-10-CM | POA: Diagnosis not present

## 2023-10-15 DIAGNOSIS — M21371 Foot drop, right foot: Secondary | ICD-10-CM | POA: Diagnosis not present

## 2023-10-15 DIAGNOSIS — G5733 Lesion of lateral popliteal nerve, bilateral lower limbs: Secondary | ICD-10-CM | POA: Diagnosis not present

## 2023-10-15 DIAGNOSIS — D692 Other nonthrombocytopenic purpura: Secondary | ICD-10-CM | POA: Diagnosis not present

## 2023-10-15 DIAGNOSIS — R490 Dysphonia: Secondary | ICD-10-CM | POA: Diagnosis not present

## 2023-10-15 DIAGNOSIS — H9193 Unspecified hearing loss, bilateral: Secondary | ICD-10-CM | POA: Diagnosis not present

## 2023-10-15 DIAGNOSIS — Z981 Arthrodesis status: Secondary | ICD-10-CM | POA: Diagnosis not present

## 2023-10-15 DIAGNOSIS — I2510 Atherosclerotic heart disease of native coronary artery without angina pectoris: Secondary | ICD-10-CM | POA: Diagnosis not present

## 2023-10-15 DIAGNOSIS — M5416 Radiculopathy, lumbar region: Secondary | ICD-10-CM | POA: Diagnosis not present

## 2023-10-16 ENCOUNTER — Encounter (HOSPITAL_COMMUNITY): Payer: Self-pay

## 2023-10-16 ENCOUNTER — Emergency Department (HOSPITAL_COMMUNITY)
Admission: EM | Admit: 2023-10-16 | Discharge: 2023-10-16 | Disposition: A | Discharge status: Home / Self Care | Payer: Medicare Other | Attending: Emergency Medicine | Admitting: Emergency Medicine

## 2023-10-16 ENCOUNTER — Emergency Department (HOSPITAL_COMMUNITY): Payer: Medicare Other

## 2023-10-16 ENCOUNTER — Ambulatory Visit: Payer: Self-pay | Admitting: Internal Medicine

## 2023-10-16 ENCOUNTER — Other Ambulatory Visit: Payer: Self-pay

## 2023-10-16 DIAGNOSIS — Z7982 Long term (current) use of aspirin: Secondary | ICD-10-CM | POA: Diagnosis not present

## 2023-10-16 DIAGNOSIS — S0990XA Unspecified injury of head, initial encounter: Secondary | ICD-10-CM | POA: Diagnosis not present

## 2023-10-16 DIAGNOSIS — Z9181 History of falling: Secondary | ICD-10-CM | POA: Diagnosis not present

## 2023-10-16 DIAGNOSIS — S51812A Laceration without foreign body of left forearm, initial encounter: Secondary | ICD-10-CM | POA: Diagnosis not present

## 2023-10-16 DIAGNOSIS — W19XXXA Unspecified fall, initial encounter: Secondary | ICD-10-CM | POA: Insufficient documentation

## 2023-10-16 DIAGNOSIS — J984 Other disorders of lung: Secondary | ICD-10-CM | POA: Diagnosis not present

## 2023-10-16 DIAGNOSIS — S01112A Laceration without foreign body of left eyelid and periocular area, initial encounter: Secondary | ICD-10-CM | POA: Diagnosis not present

## 2023-10-16 DIAGNOSIS — S0993XA Unspecified injury of face, initial encounter: Secondary | ICD-10-CM | POA: Diagnosis present

## 2023-10-16 DIAGNOSIS — R55 Syncope and collapse: Secondary | ICD-10-CM | POA: Insufficient documentation

## 2023-10-16 DIAGNOSIS — S51012A Laceration without foreign body of left elbow, initial encounter: Secondary | ICD-10-CM | POA: Insufficient documentation

## 2023-10-16 DIAGNOSIS — J9811 Atelectasis: Secondary | ICD-10-CM | POA: Diagnosis not present

## 2023-10-16 DIAGNOSIS — I6782 Cerebral ischemia: Secondary | ICD-10-CM | POA: Diagnosis not present

## 2023-10-16 LAB — COMPREHENSIVE METABOLIC PANEL
ALT: 20 U/L (ref 0–44)
AST: 25 U/L (ref 15–41)
Albumin: 3.5 g/dL (ref 3.5–5.0)
Alkaline Phosphatase: 67 U/L (ref 38–126)
Anion gap: 7 (ref 5–15)
BUN: 27 mg/dL — ABNORMAL HIGH (ref 8–23)
CO2: 23 mmol/L (ref 22–32)
Calcium: 8.9 mg/dL (ref 8.9–10.3)
Chloride: 108 mmol/L (ref 98–111)
Creatinine, Ser: 1.2 mg/dL (ref 0.61–1.24)
GFR, Estimated: 57 mL/min — ABNORMAL LOW (ref >60)
Glucose, Bld: 148 mg/dL — ABNORMAL HIGH (ref 70–99)
Potassium: 4.5 mmol/L (ref 3.5–5.1)
Sodium: 138 mmol/L (ref 135–145)
Total Bilirubin: 0.9 mg/dL (ref 0.0–1.2)
Total Protein: 6.5 g/dL (ref 6.5–8.1)

## 2023-10-16 LAB — CBC WITH DIFFERENTIAL/PLATELET
Abs Immature Granulocytes: 0.01 10*3/uL (ref 0.00–0.07)
Basophils Absolute: 0 10*3/uL (ref 0.0–0.1)
Basophils Relative: 0 %
Eosinophils Absolute: 0.2 10*3/uL (ref 0.0–0.5)
Eosinophils Relative: 4 %
HCT: 30 % — ABNORMAL LOW (ref 39.0–52.0)
Hemoglobin: 9.6 g/dL — ABNORMAL LOW (ref 13.0–17.0)
Immature Granulocytes: 0 %
Lymphocytes Relative: 11 %
Lymphs Abs: 0.6 10*3/uL — ABNORMAL LOW (ref 0.7–4.0)
MCH: 37.2 pg — ABNORMAL HIGH (ref 26.0–34.0)
MCHC: 32 g/dL (ref 30.0–36.0)
MCV: 116.3 fL — ABNORMAL HIGH (ref 80.0–100.0)
Monocytes Absolute: 0.7 10*3/uL (ref 0.1–1.0)
Monocytes Relative: 13 %
Neutro Abs: 3.7 10*3/uL (ref 1.7–7.7)
Neutrophils Relative %: 72 %
Platelet Morphology: NORMAL
Platelets: 208 10*3/uL (ref 150–400)
RBC: 2.58 MIL/uL — ABNORMAL LOW (ref 4.22–5.81)
RDW: 13.5 % (ref 11.5–15.5)
WBC: 5.2 10*3/uL (ref 4.0–10.5)
nRBC: 0 % (ref 0.0–0.2)

## 2023-10-16 NOTE — ED Provider Notes (Signed)
[place] EMERGENCY DEPARTMENT AT [facility] Provider Note   CSN: [id number] Arrival date & time: 10/16/23  4098     History  Chief Complaint  Patient presents with   Marletta Lor    Ronald [name] is a 89 y.o. male.  89 yo M with a chief complaints of multiple syncopal events.  This been going on for a few weeks now.  Happens at least 2 or 3 times a week.  Most recently happened yesterday.  He was walking to get the mail for like his vision suddenly went all black and woke up on the ground.  He denies any specific injury from the fall.  Has had some skin tears that he is not that worried about.  Tells me that he would not of come but happens to be here for a procedure for his wife.   Fall       Home Medications Prior to Admission medications   Medication Sig Start Date End Date Taking? Authorizing Provider  aspirin 81 MG tablet Take 81 mg by mouth daily.    [provider]  atorvastatin (LIPITOR) 40 MG tablet Take 1 tablet (40 mg total) by mouth every evening. 12/26/22   [name], MD  b complex vitamins tablet Take 1 tablet by mouth daily.    [provider]  Cholecalciferol (VITAMIN D) 2000 UNITS CAPS Take 2,000 Units by mouth daily.    [provider]  citalopram (CELEXA) 20 MG tablet Take 1 tablet (20 mg total) by mouth daily. 08/26/23   [name], MD  clotrimazole-betamethasone (LOTRISONE) cream Use as directed twice per day as needed Patient not taking: Reported on 05/30/2023 10/08/22   [name], MD  donepezil (ARICEPT) 10 MG tablet Take 1 tablet (10 mg total) by mouth at bedtime. 10/08/23   [name], MD  GEMTESA 75 MG TABS SMARTSIG:1 Tablet(s) By Mouth Every Evening 07/16/23   [provider]  ketoconazole (NIZORAL) 2 % cream APPLY  CREAM TOPICALLY TO AFFECTED AREA ONCE DAILY 09/26/23   [name], MD  levocetirizine (XYZAL) 5 MG tablet TAKE 1 TABLET BY MOUTH ONCE DAILY AS NEEDED FOR ALLERGIES 09/26/23   [name], MD  memantine (NAMENDA) 10 MG tablet Take 1 tablet (10 mg total) by mouth 2 (two) times daily. 05/30/23   [name], MD  propranolol ER (INDERAL LA) 60 MG 24 hr capsule Take 1 capsule (60 mg total) by mouth daily. 12/26/22   [name], MD  solifenacin (VESICARE) 5 MG tablet Take 1 tablet (5 mg total) by mouth daily. 05/30/23   [name], MD  telmisartan (MICARDIS) 40 MG tablet Take 1 tablet by mouth once daily 01/23/23   [name], MD  torsemide (DEMADEX) 10 MG tablet Take 1 tablet (10 mg total) by mouth as needed. Patient not taking: Reported on 05/30/2023 09/23/22   Wendall Stade, MD  triamcinolone (NASACORT) 55 MCG/ACT AERO nasal inhaler Place 2 sprays into the nose daily. Patient not taking: Reported on 05/30/2023 10/08/22   [name], MD  triamcinolone cream (KENALOG) 0.1 % Apply topically 2 (two) times daily. 04/11/22   [provider]      Allergies    Aricept [donepezil]    Review of Systems   Review of Systems  Physical Exam Updated Vital Signs BP 135/71   Pulse 60   Temp (!) 97.4 F (36.3 C) (Oral)   Resp 18  Ht 5\' 10"  (1.778 m)   Wt 72.6 kg   SpO2 100%   BMI 22.96 kg/m  Physical Exam Vitals and nursing note reviewed.  Constitutional:      Appearance: He is well-developed.  HENT:     Head: Normocephalic.     Comments: Skin tear to the left eyebrow. Eyes:     Pupils: Pupils are equal, round, and reactive to light.  Neck:     Vascular: No JVD.  Cardiovascular:     Rate and Rhythm: Normal rate and regular rhythm.     Heart sounds: No murmur heard.    No friction rub. No gallop.  Pulmonary:     Effort: No respiratory distress.     Breath sounds: No wheezing.  Abdominal:     General: There is no distension.     Tenderness: There is no abdominal tenderness. There is no guarding or rebound.  Musculoskeletal:        General: Normal range of motion.     Cervical back: Normal range of motion and neck supple.     Comments: Multiple skin  tears to the left forearm and elbow.  Skin:    Coloration: Skin is not pale.     Findings: No rash.  Neurological:     Mental Status: He is alert and oriented to person, place, and time.  Psychiatric:        Behavior: Behavior normal.     ED Results / Procedures / Treatments   Labs (all labs ordered are listed, but only abnormal results are displayed) Labs Reviewed  CBC WITH DIFFERENTIAL/PLATELET - Abnormal; Notable for the following components:      Result Value   RBC 2.58 (*)    Hemoglobin 9.6 (*)    HCT 30.0 (*)    MCV 116.3 (*)    MCH 37.2 (*)    Lymphs Abs 0.6 (*)    All other components within normal limits  COMPREHENSIVE METABOLIC PANEL - Abnormal; Notable for the following components:   Glucose, Bld 148 (*)    BUN 27 (*)    GFR, Estimated 57 (*)    All other components within normal limits    EKG EKG Interpretation Date/Time:  Thursday October 16 2023 10:44:29 EST Ventricular Rate:  55 PR Interval:  162 QRS Duration:  134 QT Interval:  488 QTC Calculation: 466 R Axis:   -70  Text Interpretation: Sinus bradycardia Left axis deviation Right bundle branch block Abnormal ECG No significant change since last tracing Confirmed by Melene Plan [phone]) on 10/16/2023 11:52:28 AM  Radiology DG Chest 2 View Result Date: 10/16/2023 CLINICAL DATA:  Recent history of falls. EXAM: CHEST - 2 VIEW COMPARISON:  Chest radiograph dated April 20, 2012. FINDINGS: The heart size and mediastinal contours are within normal limits. Prior median sternotomy and CABG. Mild left basilar scarring/atelectasis. No focal consolidation sizeable, pleural effusion, or pneumothorax. Multilevel degenerative changes of the spine. No acute osseous abnormality identified. IMPRESSION: Mild left basilar scarring/atelectasis. Otherwise, no acute findings in the chest. Electronically Signed   By: Hart Robinsons M.D.   On: 10/16/2023 11:47   CT Head Wo Contrast Result Date: 10/16/2023 CLINICAL DATA:  Head  trauma. EXAM: CT HEAD WITHOUT CONTRAST TECHNIQUE: Contiguous axial images were obtained from the base of the skull through the vertex without intravenous contrast. RADIATION DOSE REDUCTION: This exam was performed according to the departmental dose-optimization program which includes automated exposure control, adjustment of the mA and/or kV according to patient size  and/or use of iterative reconstruction technique. COMPARISON:  Brain MRI dated 06/22/2023. FINDINGS: Brain: Mild age-related atrophy and chronic microvascular ischemic changes. 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 There is no acute intracranial hemorrhage. No mass effect or midline shift. No extra-axial fluid collection. Vascular: No hyperdense vessel or unexpected calcification. Skull: Normal. Negative for fracture or focal lesion. Sinuses/Orbits: The visualized paranasal sinuses and the right mastoid air cells are clear. Minimal left mastoid effusion. Other: None IMPRESSION: 1. No acute intracranial pathology. 2. Mild age-related atrophy and chronic microvascular ischemic changes. Electronically Signed   By: Elgie Collard M.D.   On: 10/16/2023 11:42    Procedures Procedures    Medications Ordered in ED Medications - No data to display  ED Course/ Medical Decision Making/ A&P                                 Medical Decision Making Amount and/or Complexity of Data Reviewed Labs: ordered. Radiology: ordered. ECG/medicine tests: ordered.   89 yo M with a chief complaints of multiple syncopal events.  Patient's events actually sound quite concerning he is usually up and walking and then suddenly everything goes black and he wakes up on the ground.  I discussed coming into the hospital which the patient and family would prefer not to do.  Will obtain a laboratory evaluation here.  EKG chest x-ray CT of the head.  CT of the head without obvious intracranial pathology.  Chest x-ray independently interpreted by me  without focal obstructive thorax.  No significant electrolyte abnormalities.  Patient's hemoglobin is slightly below baseline.  I discussed results with the patient and family.  I did offer admission but they would prefer to go home.  Will place a referral for his cardiologist.  2:43 PM:  I have discussed the diagnosis/risks/treatment options with the patient.  Evaluation and diagnostic testing in the emergency department does not suggest an emergent condition requiring admission or immediate intervention beyond what has been performed at this time.  They will follow up with PCP. We also discussed returning to the ED immediately if new or worsening sx occur. We discussed the sx which are most concerning (e.g., sudden worsening pain, fever, inability to tolerate by mouth) that necessitate immediate return. Medications administered to the patient during their visit and any new prescriptions provided to the patient are listed below.  Medications given during this visit Medications - No data to display   The patient appears reasonably screen and/or stabilized for discharge and I doubt any other medical condition or other [facility] requiring further screening, evaluation, or treatment in the ED at this time prior to discharge.          Final Clinical Impression(s) / ED Diagnoses Final diagnoses:  Syncope and collapse    Rx / DC Orders ED Discharge Orders          Ordered    Ambulatory referral to Cardiology       Comments: If you have not heard from the Cardiology office within the next 72 hours please call [phone].   10/16/23 1154              Melene Plan, DO 10/16/23 1443

## 2023-10-16 NOTE — Telephone Encounter (Signed)
Ok for rov if pt desires, thanks

## 2023-10-16 NOTE — Discharge Instructions (Signed)
With your family doctor in the office follow-up with your cardiologist.

## 2023-10-16 NOTE — Telephone Encounter (Signed)
Copied from CRM [id number]. Topic: Clinical - Red Word Triage >> Oct 16, 2023  9:08 AM Lorin Glass B wrote: Red Word that prompted transfer to Nurse Triage: Larey Seat this morning, bleeding in number of different places on his arm and face in temple area. Currently at hospital with daughter for wifes surgical outpatient procedure.   Chief Complaint: Fainting/recent falls Symptoms: syncopal episodes Frequency: multiple times in the past few weeks Pertinent Negatives: Patient denies chest pain, abdominal pain, nausea, vomiting, diarrhea Disposition: [] ED /[] Urgent Care (no appt availability in office) / [] Appointment(In office/virtual)/ []  [facility]/ [] Home Care/ [x] Refused Recommended Disposition /[] [facility]/ []  Follow-up with PCP Additional Notes: Patient's daughter in law Darel Hong called and advised that she is with the patient and they are in the waiting room while the patient's wife is being seen in the hospital today.   She advised that lately he has been having falls, watch cut into his left arm and there is bleeding/cuts on this arm, bleeding around a cut on left eye, and cuts up and down his left arm, and the daughter in law put bandaids on the areas.  He has fallen twice in the kitchen, fallen in the bedroom, fallen twice taking the trash out.  This morning he fell taking the trash out. She states that he feels like he is passing out everytime that this happens.  She states that he also has hit his head a few times.  She advised that he denies any chest pain, abdominal pain, nausea, vomiting, or diarrhea.  Call back for daughter in law [phone].  They are currently in the waiting room at [facility] in the central waiting area.  She advised that she is not taking him to the emergency room because they have to wait there for the patient's wife and because she doesn't want to take him to the emergency room because they just want his PCP to see him.  She is advised that multiple  unexplained falls with someone of his age with passing out unexplainably with each episode, injuries, and hitting his head all have a recommendation of him being seen in the E.R.  She is advised multiple times that this is what is recommended at this time but states that they are not going to the ER unless something major worsens.  She is advised to get him to the ER immediately if something worsens and she does verbalized understanding of this.  Reason for Disposition  Sounds like a life-threatening emergency to the triager  Protocols used: [facility]

## 2023-10-16 NOTE — Telephone Encounter (Signed)
Called [facility] and advised them of E.D. Disposition refusal

## 2023-10-16 NOTE — ED Triage Notes (Addendum)
Patient has had an increased number of falls over the last 6 weeks. No blood thinners. Stated he feels like before he falls his eyes will go black. Fell 30 minutes ago onto the floor. Denies pain. Hit the left side of his face and left arm today after falling. Usually ambulates with a cane. But today did not use it.

## 2023-10-16 NOTE — Telephone Encounter (Signed)
Called patient Ronald [name], Ronald [name], okay per DPR. They at hosptial with mother and want to come in to see Provider tomorrow for his increased falls over last 6 weeks.  Stated he is taking his Demadex as needed, so not happening trying to get to the bathroom. Patient not using cane at home mainly as he is moving around and sometimes caring things. Falls occur out of no wear not just with position changes, sitting to standing or laying to sitting/standing. Has hit his head a few times with these falls.  Encouraged to go to ED, they are at [facility] and should wheel him over to be seen and get CT scan, while family in surgery. Let the desk know that she will be bouncing between two  He could have experienced a stroke or other issues casing him to fall.  Agreed and said would take him to ED to be seen.

## 2023-10-17 ENCOUNTER — Ambulatory Visit: Payer: Self-pay | Admitting: Internal Medicine

## 2023-10-17 DIAGNOSIS — I10 Essential (primary) hypertension: Secondary | ICD-10-CM | POA: Diagnosis not present

## 2023-10-17 DIAGNOSIS — G5733 Lesion of lateral popliteal nerve, bilateral lower limbs: Secondary | ICD-10-CM | POA: Diagnosis not present

## 2023-10-17 DIAGNOSIS — D692 Other nonthrombocytopenic purpura: Secondary | ICD-10-CM | POA: Diagnosis not present

## 2023-10-17 DIAGNOSIS — R490 Dysphonia: Secondary | ICD-10-CM | POA: Diagnosis not present

## 2023-10-17 DIAGNOSIS — M19019 Primary osteoarthritis, unspecified shoulder: Secondary | ICD-10-CM | POA: Diagnosis not present

## 2023-10-17 DIAGNOSIS — J309 Allergic rhinitis, unspecified: Secondary | ICD-10-CM | POA: Diagnosis not present

## 2023-10-17 DIAGNOSIS — M25512 Pain in left shoulder: Secondary | ICD-10-CM | POA: Diagnosis not present

## 2023-10-17 DIAGNOSIS — Z7982 Long term (current) use of aspirin: Secondary | ICD-10-CM | POA: Diagnosis not present

## 2023-10-17 DIAGNOSIS — H9193 Unspecified hearing loss, bilateral: Secondary | ICD-10-CM | POA: Diagnosis not present

## 2023-10-17 DIAGNOSIS — Z951 Presence of aortocoronary bypass graft: Secondary | ICD-10-CM | POA: Diagnosis not present

## 2023-10-17 DIAGNOSIS — Z981 Arthrodesis status: Secondary | ICD-10-CM | POA: Diagnosis not present

## 2023-10-17 DIAGNOSIS — K279 Peptic ulcer, site unspecified, unspecified as acute or chronic, without hemorrhage or perforation: Secondary | ICD-10-CM | POA: Diagnosis not present

## 2023-10-17 DIAGNOSIS — G250 Essential tremor: Secondary | ICD-10-CM | POA: Diagnosis not present

## 2023-10-17 DIAGNOSIS — G8929 Other chronic pain: Secondary | ICD-10-CM | POA: Diagnosis not present

## 2023-10-17 DIAGNOSIS — E785 Hyperlipidemia, unspecified: Secondary | ICD-10-CM | POA: Diagnosis not present

## 2023-10-17 DIAGNOSIS — M5416 Radiculopathy, lumbar region: Secondary | ICD-10-CM | POA: Diagnosis not present

## 2023-10-17 DIAGNOSIS — N3281 Overactive bladder: Secondary | ICD-10-CM | POA: Diagnosis not present

## 2023-10-17 DIAGNOSIS — I2510 Atherosclerotic heart disease of native coronary artery without angina pectoris: Secondary | ICD-10-CM | POA: Diagnosis not present

## 2023-10-17 DIAGNOSIS — M21371 Foot drop, right foot: Secondary | ICD-10-CM | POA: Diagnosis not present

## 2023-10-17 NOTE — Telephone Encounter (Signed)
Copied from CRM [id number]. Topic: General - Other >> Oct 16, 2023  5:01 PM Fuller Mandril wrote: Reason for CRM: Darel Hong called to let clinician know that patient was take to ED at [facility]. Caller is concerned about patient recent falls and would like to make sure that an order for a rollator is placed for patient to help prevent falling. Per caller Hospital has ruled out stroke. Thank You

## 2023-10-17 NOTE — Telephone Encounter (Signed)
Chief Complaint: Syncope Symptoms: Syncope Frequency: Multiple times x3 weeks Pertinent Negatives: Patient denies fever, weakness, numbness, dehydration, malnourishment, chest pain. Disposition: [] ED /[] Urgent Care (no appt availability in office) / [x] Appointment(In office/virtual)/ []  [facility]/ [] Home Care/ [] Refused Recommended Disposition /[] [facility]/ []  Follow-up with PCP Additional Notes: Patient's daughter Bosie Clos called with complaints of patient having episodes of fainting and needing education on whether if his medications could be the cause. Patient's daughter states that episodes of syncope first started around [facility] 2024, and patient has had multiple falls with injury to back, head, and skin tears as a result. Patient's daughter is not aware of what happens during the syncope events or for how long patient is unconscious. Patient was seen in the ER on 1/30 for symptoms, where head CT was done. Patient's daughter states that patient is older now, weighs less, and is less active than previous years, and the medications that are most concerning include propanolol, telmisartan, and Demadex, given that his BP and pulse are not as high as they used to be. Patient's daughter denies DM, malnourishment/dehydration, seizure, numbness, weakness, or previous head injury prior to syncope onset. This RN advised patient's daughter that patient should have BP and pulse checks before taking BP medication and to refrain from taking beta blocker if pulse and BP are low. This RN advised patient's daughter to make sure patient stays well hydrated, but to take telmisartan as prescribed so that BP can be maintained, unless BP is also too low. This RN advised that patient be seen per protocol in the office within two weeks, to which patient's daughter was agreeable. 2/12 appt scheduled. This RN advised patient's daughter to call back go to ER if fainting reoccurs. Patient's daughter  verbalized understanding. Copied from CRM [id number]. Topic: Clinical - Red Word Triage >> Oct 17, 2023  4:23 PM Steele Sizer wrote: Red Word that prompted transfer to Nurse Triage: Pt daughter in law Darel Hong called and stated that the pt is currently  blacking out and falling very violently. He has fallen over 8 times within the last 6 weeks. She stated that she feels like his blood pressure medication is causing his heart rate to drop to low, and would like some medical advise and address the medication he is currently taking which are telmisartan (MICARDIS) 40 MG, propranolol ER (INDERAL LA) 60 MG 24 hr capsule, torsemide (DEMADEX) 10 MG tablet. Reason for Disposition  Simple fainting is a chronic symptom (has occurred multiple times)  Answer Assessment - Initial Assessment Questions 1. ONSET: "How long were you unconscious?" (minutes) "When did it happen?"     December 2024, "we don't know for how long. We just know he's been fainting and it's been happening for a couple or few weeks." 2. CONTENT: "What happened during period of unconsciousness?" (e.g., seizure activity)      "We don't know." 3. MENTAL STATUS: "Alert and oriented now?" (oriented x 3 = name, month, location)      Oriented, "He was seen in the ER yesterday for it, he hasn't fainted today but it's been happening a lot." 4. TRIGGER: "What do you think caused the fainting?" "What were you doing just before you fainted?"  (e.g., exercise, sudden standing up, prolonged standing)     "I think he's taking too many medications. They have him on multiple blood pressure meds and medications for overactive bladder, and then he has a water pill that he takes for swelling. I don't know if his  BP is dropping or his HR is going too low." 5. RECURRENT SYMPTOM: "Have you ever passed out before?" If Yes, ask: "When was the last time?" and "What happened that time?"      This ha been happening frequently for the past few weeks and I'm really worried." 6.  INJURY: "Did you sustain any injury during the fall?"      "Well he's fallen on his back, he sits in his chair then just blacks out and hits his head. He's got bruises from the fainting. He was seen at the ER for it yesterday where they took a CT of his head and everything." 7. CARDIAC SYMPTOMS: "Have you had any of the following symptoms: chest pain, difficulty breathing, palpitations?"     "We dont know. He just faints." 8. NEUROLOGIC SYMPTOMS: "Have you had any of the following symptoms: headache, numbness, vertigo, weakness?"     Denies 9. GI SYMPTOMS: "Have you had any of the following symptoms: abdomen pain, vomiting, diarrhea, blood in stools?"    Denies 10. OTHER SYMPTOMS: "Do you have any other symptoms?"     "He's not as big as he used to be, he is more sedentary. I just think he's taking too many pills, I don't know what else it could be."  Protocols used: Fainting-A-AH

## 2023-10-17 NOTE — Telephone Encounter (Signed)
Ok to hold on order for rollater pending his ED eval.   thanks

## 2023-10-20 ENCOUNTER — Ambulatory Visit (INDEPENDENT_AMBULATORY_CARE_PROVIDER_SITE_OTHER): Payer: Medicare Other

## 2023-10-20 ENCOUNTER — Ambulatory Visit: Payer: Medicare Other | Attending: Cardiology | Admitting: Cardiology

## 2023-10-20 ENCOUNTER — Encounter: Payer: Self-pay | Admitting: Cardiology

## 2023-10-20 VITALS — BP 132/56 | HR 83 | Ht 70.0 in | Wt 162.0 lb

## 2023-10-20 DIAGNOSIS — I25810 Atherosclerosis of coronary artery bypass graft(s) without angina pectoris: Secondary | ICD-10-CM | POA: Diagnosis not present

## 2023-10-20 DIAGNOSIS — Z951 Presence of aortocoronary bypass graft: Secondary | ICD-10-CM

## 2023-10-20 DIAGNOSIS — R55 Syncope and collapse: Secondary | ICD-10-CM

## 2023-10-20 DIAGNOSIS — R296 Repeated falls: Secondary | ICD-10-CM | POA: Diagnosis not present

## 2023-10-20 NOTE — Progress Notes (Unsigned)
ZIO XT serial # [id number] from office inventory applied to patient.

## 2023-10-20 NOTE — Progress Notes (Signed)
 Cardiology Office Note:    Date:  11/10/2023   ID:  Ronald [name], DOB [birth year], MRN [id number]  PCP:  [name], MD   [facility] Providers Cardiologist:  Charlton Haws, MD    Referring MD: Melene Plan, DO   Chief Complaint  Patient presents with   Follow-up   History of Present Illness:    Ronald [name] is a 89 y.o. male with a hx of CAD s/p CABG (1998) with known occluded SVG to RCA by cath 2003, HLD, RBBB, frequent falls, and memory decline who is being seen today for ED follow up after multiple syncopal episodes.   [name] is followed by Dr. Eden Emms for his cardiology care. He underwent CABG in 1998 and subsequent cath 2003 with occluded SVG to RCA. Last Myoview stress test 2008 with no ischemia. No prior echo in our system for review. On last OV with Dr. Eden Emms the patient was doing well with no significant complaints. He continued to care for his wife and was very active with daily stationary bike riding.   He recently presented to [facility] after multiple syncopal episodes. At that time he reported episodes had been occurring for several weeks and occurring 2-3 times per week. Most recent episode was while he walked to get the mail. He reported that his vision went black/dark and he woke up on the ground. EKG with sinus bradycardiac with a HR at 55bpm and RBBB which appears to be at his baseline. CBC with Hb 9.6 but stable. CXR with no acute cardiopulmonary findings. CT head with no abnormalities.   Today he states he has been stable but concerned about his symptoms. As above, episodes have all been similar in that he is ambulating when it begins. His vision goes black but he is conscious and able to hear everything around him. He then feels himself hit the ground but still is unable to see anything. Slowly, his vision returns. He has no pain, denies chest pain, SOB, palpitations, LE edema, orthopnea, or PND. Unclear etiology however will need exclude cardiac events leading  to his symptoms.   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   b complex vitamins tablet Take 1 tablet by mouth daily.   Cholecalciferol (VITAMIN D) 2000 UNITS CAPS Take 2,000 Units by mouth daily.   citalopram (CELEXA) 20 MG tablet Take 1 tablet (20 mg total) by mouth daily.   donepezil (ARICEPT) 10 MG tablet  Take 1 tablet (10 mg total) by mouth at bedtime.   GEMTESA 75 MG TABS SMARTSIG:1 Tablet(s) By Mouth Every Evening   ketoconazole (NIZORAL) 2 % cream APPLY  CREAM TOPICALLY TO AFFECTED AREA ONCE DAILY   levocetirizine (XYZAL) 5 MG tablet TAKE 1 TABLET BY MOUTH ONCE DAILY AS NEEDED FOR ALLERGIES   memantine (NAMENDA) 10 MG tablet Take 1 tablet (10 mg total) by mouth 2 (two) times daily.   telmisartan (MICARDIS) 40 MG tablet Take 1 tablet by mouth once daily   torsemide (DEMADEX) 10 MG tablet Take 1 tablet (10 mg total) by mouth as needed.     Allergies:   Patient has no active allergies.   Social History   Socioeconomic History   Marital status: Married     Spouse name: Not on file   Number of children: 3   Years of education: 16   Highest education level: Bachelor's degree (e.g., BA, AB, BS)  Occupational History   Occupation: Retired    Associate Professor: RETIRED  Tobacco Use   Smoking status: Never   Smokeless tobacco: Never  Vaping Use   Vaping status: Never Used  Substance and Sexual Activity   Alcohol use: Not Currently    Comment: Occ   Drug use: No   Sexual activity: Not Currently  Other Topics Concern   Not on file  Social History Narrative   Son is internist in [place]   Right handed    One story home   Lives with wife   [employer] years ago work status   Social Drivers of Corporate investment banker Strain: Low Risk  (10/29/2023)   Overall Financial Resource Strain (CARDIA)    Difficulty of Paying Living Expenses: Not hard at all  Food Insecurity: No Food Insecurity (10/29/2023)   Hunger Vital Sign    Worried About Running Out of Food in the Last Year: Never true    Ran Out of Food in the Last Year: Never true  Transportation Needs: No Transportation Needs (10/29/2023)   PRAPARE - Administrator, Civil Service (Medical): No    Lack of Transportation (Non-Medical): No  Physical Activity: Insufficiently Active (10/29/2023)   Exercise Vital Sign    Days of Exercise per Week: 3 days    Minutes of Exercise per Session: 30 min  Stress: Stress Concern Present (10/29/2023)   [employer] of Occupational Health - Occupational Stress Questionnaire    Feeling of Stress : To some extent  Social Connections: Socially Isolated (10/29/2023)   Social Connection and Isolation Panel [NHANES]    Frequency of Communication with Friends and Family: Once a week    Frequency of Social Gatherings with Friends and Family: Once a week    Attends Religious Services: Never    Database administrator or Organizations: No    Attends Engineer, structural: Not on file    Marital Status: Married    Family History: The  patient's family history includes Colon cancer in his father.  ROS:   Please see the history of present illness.     All other systems reviewed and are negative.  EKGs/Labs/Other Studies Reviewed:    Recent Labs: 06/25/2023: TSH 2.78 10/16/2023: ALT 20; BUN 27; Creatinine, Ser 1.20; Hemoglobin 9.6; Platelets 208; Potassium 4.5; Sodium 138   Recent Lipid Panel    Component Value Date/Time   CHOL 134 10/08/2022 1054   TRIG 56.0 10/08/2022 1054   HDL 61.50 10/08/2022 1054   CHOLHDL  2 10/08/2022 1054   VLDL 11.2 10/08/2022 1054   LDLCALC 62 10/08/2022 1054    Physical Exam:    VS:  BP (!) 132/56   Pulse 83   Ht 5\' 10"  (1.778 m)   Wt 162 lb (73.5 kg)   SpO2 95%   BMI 23.24 kg/m     Wt Readings from Last 3 Encounters:  10/29/23 166 lb (75.3 kg)  10/20/23 162 lb (73.5 kg)  10/16/23 160 lb (72.6 kg)    General: Well developed, well nourished, NAD Lungs:Clear to ausculation bilaterally. No wheezes, rales, or rhonchi. Breathing is unlabored. Cardiovascular: RRR with S1 S2. No murmurs Extremities: No edema.  Neuro: Alert and oriented. No focal deficits. No facial asymmetry. MAE spontaneously. Psych: Responds to questions appropriately with normal affect.    ASSESSMENT/PLAN:    Syncope: Unclear etiology. Plan echocardiogram to evaluate for structural issues. No murmurs on exam. Denies palpitations. Place Zio monitor to evaluate for arrhythmias. Obtain carotid vascular dopplers to exclude carotid stenosis.  Bradycardia with known RBBB: EKG today with sinus bradycardia with HR 55bpm. Plan ZIO for full assessment. Not currently on AV nodal blocking agents.   CAD: s/p CABG 1998 with most recent cath 2003 with occluded SVG to RCA. Myoview stress test 2008 with no ischemia. Denies anginal symptoms. Obtain echocardiogram.      Medication Adjustments/Labs and Tests Ordered: Current medicines are reviewed at length with the patient today.  Concerns regarding medicines are outlined  above.  Orders Placed This Encounter  Procedures   LONG TERM MONITOR (3-14 DAYS)   ECHOCARDIOGRAM COMPLETE   No orders of the defined types were placed in this encounter.   Patient Instructions  Medication Instructions:  The current medical regimen is effective;  continue present plan and medications.  *If you need a refill on your cardiac medications before your next appointment, please call your pharmacy*  Testing/Procedures: Your physician has requested that you have an echocardiogram. Echocardiography is a painless test that uses sound waves to create images of your heart. It provides your doctor with information about the size and shape of your heart and how well your heart's chambers and valves are working. This procedure takes approximately one hour. There are no restrictions for this procedure. Please do NOT wear cologne, perfume, aftershave, or lotions (deodorant is allowed). Please arrive 15 minutes prior to your appointment time.  Please note: We ask at that you not bring children with you during ultrasound (echo/ vascular) testing. Due to room size and safety concerns, children are not allowed in the ultrasound rooms during exams. Our front office staff cannot provide observation of children in our lobby area while testing is being conducted. An adult accompanying a patient to their appointment will only be allowed in the ultrasound room at the discretion of the ultrasound technician under special circumstances. We apologize for any inconvenience.  ZIO AT Long term monitor-Live Telemetry  Your physician has requested you wear a ZIO patch monitor for 14 days.  This is a single patch monitor. [employer] supplies one patch monitor per enrollment. Additional  stickers are not available.  Please do not apply patch if you will be having a Nuclear Stress Test, Echocardiogram, Cardiac CT, MRI,  or Chest Xray during the period you would be wearing the monitor. The patch cannot be worn  during  these tests. You cannot remove and re-apply the ZIO AT patch monitor.  Your ZIO patch monitor will be mailed 3 day USPS to your address on file.  It may take 3-5 days to  receive your monitor after you have been enrolled.  Once you have received your monitor, please review the enclosed instructions. Your monitor has  already been registered assigning a specific monitor serial # to you.   Billing and Patient Assistance Program information  Meredeth Ide has been supplied with any insurance information on record for billing. [employer] offers a sliding scale Patient Assistance Program for patients without insurance, or whose  insurance does not completely cover the cost of the ZIO patch monitor. You must apply for the  Patient Assistance Program to qualify for the discounted rate. To apply, call [employer] at [phone],  select option 4, select option 2 , ask to apply for the Patient Assistance Program, (you can request an  interpreter if needed). [employer] will ask your household income and how many people are in your  household. [employer] will quote your out-of-pocket cost based on this information. They will also be able  to set up a 12 month interest free payment plan if needed.  Applying the monitor   Shave hair from upper left chest.  Hold the abrader disc by orange tab. Rub the abrader in 40 strokes over left upper chest as indicated in  your monitor instructions.  Clean area with 4 enclosed alcohol pads. Use all pads to ensure the area is cleaned thoroughly. Let  dry.  Apply patch as indicated in monitor instructions. Patch will be placed under collarbone on left side of  chest with arrow pointing upward.  Rub patch adhesive wings for 2 minutes. Remove the white label marked "1". Remove the white label  marked "2". Rub patch adhesive wings for 2 additional minutes.  While looking in a mirror, press and release button in center of patch. A small green light will flash 3-4  times. This  will be your only indicator that the monitor has been turned on.  Do not shower for the first 24 hours. You may shower after the first 24 hours.  Press the button if you feel a symptom. You will hear a small click. Record Date, Time and Symptom in  the Patient Log.   Starting the Gateway  In your kit there is a Audiological scientist box the size of a cellphone. This is Buyer, retail. It transmits all your  recorded data to [facility]. This box must always stay within 10 feet of you. Open the box and push the *  button. There will be a light that blinks orange and then green a few times. When the light stops  blinking, the Gateway is connected to the ZIO patch. Call [employer] at [phone] to confirm your monitor is transmitting.  Returning your monitor  Remove your patch and place it inside the Gateway. In the lower half of the Gateway there is a white  bag with prepaid postage on it. Place Gateway in bag and seal. Mail package back to [place] as soon as  possible. Your physician should have your final report approximately 7 days after you have mailed back  your monitor. Call [facility] Customer Care at [phone] if you have questions regarding your ZIO AT  patch monitor. Call them immediately if you see an orange light blinking on your monitor.  If your monitor falls off in less than 4 days, contact our Monitor department at [phone]. If your  monitor becomes loose or falls off after 4 days call [employer] at [phone] for suggestions on  securing your monitor    Follow-Up: At  [facility], you and your health needs are our priority.  As part of our continuing mission to provide you with exceptional heart care, we have created designated Provider Care Teams.  These Care Teams include your primary Cardiologist (physician) and Advanced Practice Providers (APPs -  Physician Assistants and Nurse Practitioners) who all work together to provide you with the care you need,  when you need it.  We recommend signing up for the patient portal called "MyChart".  Sign up information is provided on this After Visit Summary.  MyChart is used to connect with patients for Virtual Visits (Telemedicine).  Patients are able to view lab/test results, encounter notes, upcoming appointments, etc.  Non-urgent messages can be sent to your provider as well.   To learn more about what you can do with MyChart, go to [email or web address].    Your next appointment:   4 - 5 week(s)  Provider:   Jari Favre, PA-C, Robin Searing, NP, Jacolyn Reedy, PA-C, Eligha Bridegroom, NP, Tereso Newcomer, PA-C, or Perlie Gold, PA-C        Or Dr Eden Emms.          Signed, Georgie Chard, NP  11/10/2023 1:24 PM    [facility]

## 2023-10-20 NOTE — Patient Instructions (Signed)
Medication Instructions:  The current medical regimen is effective;  continue present plan and medications.  *If you need a refill on your cardiac medications before your next appointment, please call your pharmacy*  Testing/Procedures: Your physician has requested that you have an echocardiogram. Echocardiography is a painless test that uses sound waves to create images of your heart. It provides your doctor with information about the size and shape of your heart and how well your heart's chambers and valves are working. This procedure takes approximately one hour. There are no restrictions for this procedure. Please do NOT wear cologne, perfume, aftershave, or lotions (deodorant is allowed). Please arrive 15 minutes prior to your appointment time.  Please note: We ask at that you not bring children with you during ultrasound (echo/ vascular) testing. Due to room size and safety concerns, children are not allowed in the ultrasound rooms during exams. Our front office staff cannot provide observation of children in our lobby area while testing is being conducted. An adult accompanying a patient to their appointment will only be allowed in the ultrasound room at the discretion of the ultrasound technician under special circumstances. We apologize for any inconvenience.  ZIO AT Long term monitor-Live Telemetry  Your physician has requested you wear a ZIO patch monitor for 14 days.  This is a single patch monitor. [employer] supplies one patch monitor per enrollment. Additional  stickers are not available.  Please do not apply patch if you will be having a Nuclear Stress Test, Echocardiogram, Cardiac CT, MRI,  or Chest Xray during the period you would be wearing the monitor. The patch cannot be worn during  these tests. You cannot remove and re-apply the ZIO AT patch monitor.  Your ZIO patch monitor will be mailed 3 day USPS to your address on file. It may take 3-5 days to  receive your monitor after  you have been enrolled.  Once you have received your monitor, please review the enclosed instructions. Your monitor has  already been registered assigning a specific monitor serial # to you.   Billing and Patient Assistance Program information  Meredeth Ide has been supplied with any insurance information on record for billing. [employer] offers a sliding scale Patient Assistance Program for patients without insurance, or whose  insurance does not completely cover the cost of the ZIO patch monitor. You must apply for the  Patient Assistance Program to qualify for the discounted rate. To apply, call [employer] at [phone],  select option 4, select option 2 , ask to apply for the Patient Assistance Program, (you can request an  interpreter if needed). [employer] will ask your household income and how many people are in your  household. [employer] will quote your out-of-pocket cost based on this information. They will also be able  to set up a 12 month interest free payment plan if needed.  Applying the monitor   Shave hair from upper left chest.  Hold the abrader disc by orange tab. Rub the abrader in 40 strokes over left upper chest as indicated in  your monitor instructions.  Clean area with 4 enclosed alcohol pads. Use all pads to ensure the area is cleaned thoroughly. Let  dry.  Apply patch as indicated in monitor instructions. Patch will be placed under collarbone on left side of  chest with arrow pointing upward.  Rub patch adhesive wings for 2 minutes. Remove the white label marked "1". Remove the white label  marked "2". Rub patch adhesive wings for 2 additional minutes.  While looking in a mirror, press and release button in center of patch. A small green light will flash 3-4  times. This will be your only indicator that the monitor has been turned on.  Do not shower for the first 24 hours. You may shower after the first 24 hours.  Press the button if you feel a symptom. You will hear a small  click. Record Date, Time and Symptom in  the Patient Log.   Starting the Gateway  In your kit there is a Audiological scientist box the size of a cellphone. This is Buyer, retail. It transmits all your  recorded data to [employer]. This box must always stay within 10 feet of you. Open the box and push the *  button. There will be a light that blinks orange and then green a few times. When the light stops  blinking, the Gateway is connected to the ZIO patch. Call [employer] at [phone] to confirm your monitor is transmitting.  Returning your monitor  Remove your patch and place it inside the Gateway. In the lower half of the Gateway there is a white  bag with prepaid postage on it. Place Gateway in bag and seal. Mail package back to [place] as soon as  possible. Your physician should have your final report approximately 7 days after you have mailed back  your monitor. Call [facility] Customer Care at [phone] if you have questions regarding your ZIO AT  patch monitor. Call them immediately if you see an orange light blinking on your monitor.  If your monitor falls off in less than 4 days, contact our Monitor department at [phone]. If your  monitor becomes loose or falls off after 4 days call [employer] at [phone] for suggestions on  securing your monitor    Follow-Up: At [facility], you and your health needs are our priority.  As part of our continuing mission to provide you with exceptional heart care, we have created designated Provider Care Teams.  These Care Teams include your primary Cardiologist (physician) and Advanced Practice Providers (APPs -  Physician Assistants and Nurse Practitioners) who all work together to provide you with the care you need, when you need it.  We recommend signing up for the patient portal called "MyChart".  Sign up information is provided on this After Visit Summary.  MyChart is used to connect with patients for Virtual Visits  (Telemedicine).  Patients are able to view lab/test results, encounter notes, upcoming appointments, etc.  Non-urgent messages can be sent to your provider as well.   To learn more about what you can do with MyChart, go to [email or web address].    Your next appointment:   4 - 5 week(s)  Provider:   Jari Favre, PA-C, Robin Searing, NP, Jacolyn Reedy, PA-C, Eligha Bridegroom, NP, Tereso Newcomer, PA-C, or Perlie Gold, PA-C        Or Dr Eden Emms.

## 2023-10-21 DIAGNOSIS — N3281 Overactive bladder: Secondary | ICD-10-CM | POA: Diagnosis not present

## 2023-10-21 DIAGNOSIS — Z981 Arthrodesis status: Secondary | ICD-10-CM | POA: Diagnosis not present

## 2023-10-21 DIAGNOSIS — I2510 Atherosclerotic heart disease of native coronary artery without angina pectoris: Secondary | ICD-10-CM | POA: Diagnosis not present

## 2023-10-21 DIAGNOSIS — M25512 Pain in left shoulder: Secondary | ICD-10-CM | POA: Diagnosis not present

## 2023-10-21 DIAGNOSIS — D692 Other nonthrombocytopenic purpura: Secondary | ICD-10-CM | POA: Diagnosis not present

## 2023-10-21 DIAGNOSIS — H9193 Unspecified hearing loss, bilateral: Secondary | ICD-10-CM | POA: Diagnosis not present

## 2023-10-21 DIAGNOSIS — M21371 Foot drop, right foot: Secondary | ICD-10-CM | POA: Diagnosis not present

## 2023-10-21 DIAGNOSIS — G250 Essential tremor: Secondary | ICD-10-CM | POA: Diagnosis not present

## 2023-10-21 DIAGNOSIS — I10 Essential (primary) hypertension: Secondary | ICD-10-CM | POA: Diagnosis not present

## 2023-10-21 DIAGNOSIS — J309 Allergic rhinitis, unspecified: Secondary | ICD-10-CM | POA: Diagnosis not present

## 2023-10-21 DIAGNOSIS — R490 Dysphonia: Secondary | ICD-10-CM | POA: Diagnosis not present

## 2023-10-21 DIAGNOSIS — G8929 Other chronic pain: Secondary | ICD-10-CM | POA: Diagnosis not present

## 2023-10-21 DIAGNOSIS — E785 Hyperlipidemia, unspecified: Secondary | ICD-10-CM | POA: Diagnosis not present

## 2023-10-21 DIAGNOSIS — M5416 Radiculopathy, lumbar region: Secondary | ICD-10-CM | POA: Diagnosis not present

## 2023-10-21 DIAGNOSIS — Z7982 Long term (current) use of aspirin: Secondary | ICD-10-CM | POA: Diagnosis not present

## 2023-10-21 DIAGNOSIS — Z951 Presence of aortocoronary bypass graft: Secondary | ICD-10-CM | POA: Diagnosis not present

## 2023-10-21 DIAGNOSIS — K279 Peptic ulcer, site unspecified, unspecified as acute or chronic, without hemorrhage or perforation: Secondary | ICD-10-CM | POA: Diagnosis not present

## 2023-10-21 DIAGNOSIS — M19019 Primary osteoarthritis, unspecified shoulder: Secondary | ICD-10-CM | POA: Diagnosis not present

## 2023-10-21 DIAGNOSIS — G5733 Lesion of lateral popliteal nerve, bilateral lower limbs: Secondary | ICD-10-CM | POA: Diagnosis not present

## 2023-10-22 ENCOUNTER — Telehealth: Payer: Self-pay | Admitting: Internal Medicine

## 2023-10-22 NOTE — Telephone Encounter (Signed)
Ok for verbal if that is ok 

## 2023-10-22 NOTE — Telephone Encounter (Signed)
 Copied from CRM [id number]. Topic: Clinical - Home Health Verbal Orders >> Oct 22, 2023  1:48 PM Burnard DEL wrote: Caller/Agency: [facility] Callback Number: [phone] Service Requested: social work consultation Any new concerns about the patient? Yes Patient is having Trouble with medication,medicare will no longer pay for pill box services. Medicaid does but medicare doesn't,so they are trying to get additional help for the patient

## 2023-10-23 ENCOUNTER — Telehealth: Payer: Self-pay

## 2023-10-23 DIAGNOSIS — H9193 Unspecified hearing loss, bilateral: Secondary | ICD-10-CM | POA: Diagnosis not present

## 2023-10-23 DIAGNOSIS — Z7982 Long term (current) use of aspirin: Secondary | ICD-10-CM | POA: Diagnosis not present

## 2023-10-23 DIAGNOSIS — E785 Hyperlipidemia, unspecified: Secondary | ICD-10-CM | POA: Diagnosis not present

## 2023-10-23 DIAGNOSIS — G8929 Other chronic pain: Secondary | ICD-10-CM | POA: Diagnosis not present

## 2023-10-23 DIAGNOSIS — K279 Peptic ulcer, site unspecified, unspecified as acute or chronic, without hemorrhage or perforation: Secondary | ICD-10-CM | POA: Diagnosis not present

## 2023-10-23 DIAGNOSIS — G250 Essential tremor: Secondary | ICD-10-CM | POA: Diagnosis not present

## 2023-10-23 DIAGNOSIS — M5416 Radiculopathy, lumbar region: Secondary | ICD-10-CM | POA: Diagnosis not present

## 2023-10-23 DIAGNOSIS — N3281 Overactive bladder: Secondary | ICD-10-CM | POA: Diagnosis not present

## 2023-10-23 DIAGNOSIS — D692 Other nonthrombocytopenic purpura: Secondary | ICD-10-CM | POA: Diagnosis not present

## 2023-10-23 DIAGNOSIS — M25512 Pain in left shoulder: Secondary | ICD-10-CM | POA: Diagnosis not present

## 2023-10-23 DIAGNOSIS — M21371 Foot drop, right foot: Secondary | ICD-10-CM | POA: Diagnosis not present

## 2023-10-23 DIAGNOSIS — Z951 Presence of aortocoronary bypass graft: Secondary | ICD-10-CM | POA: Diagnosis not present

## 2023-10-23 DIAGNOSIS — G5733 Lesion of lateral popliteal nerve, bilateral lower limbs: Secondary | ICD-10-CM | POA: Diagnosis not present

## 2023-10-23 DIAGNOSIS — M19019 Primary osteoarthritis, unspecified shoulder: Secondary | ICD-10-CM | POA: Diagnosis not present

## 2023-10-23 DIAGNOSIS — I10 Essential (primary) hypertension: Secondary | ICD-10-CM | POA: Diagnosis not present

## 2023-10-23 DIAGNOSIS — R490 Dysphonia: Secondary | ICD-10-CM | POA: Diagnosis not present

## 2023-10-23 DIAGNOSIS — I2510 Atherosclerotic heart disease of native coronary artery without angina pectoris: Secondary | ICD-10-CM | POA: Diagnosis not present

## 2023-10-23 DIAGNOSIS — J309 Allergic rhinitis, unspecified: Secondary | ICD-10-CM | POA: Diagnosis not present

## 2023-10-23 DIAGNOSIS — Z981 Arthrodesis status: Secondary | ICD-10-CM | POA: Diagnosis not present

## 2023-10-23 NOTE — Telephone Encounter (Signed)
 Ok to follow for now, and can be followed at next visit,   thanks

## 2023-10-23 NOTE — Telephone Encounter (Signed)
 Copied from CRM [id number]. Topic: Clinical - Medical Advice >> Oct 23, 2023  2:34 PM Franky GRADE wrote:  Reason for CRM: Alan from [facility] is calling to report elevated blood pressure, reading was 152/76. Patient is not experiencing any symptoms. Amanda's call back number in case the office needs to speak with her is [phone].

## 2023-10-23 NOTE — Telephone Encounter (Signed)
 Called and left voice mail

## 2023-10-27 DIAGNOSIS — Z7982 Long term (current) use of aspirin: Secondary | ICD-10-CM | POA: Diagnosis not present

## 2023-10-27 DIAGNOSIS — D692 Other nonthrombocytopenic purpura: Secondary | ICD-10-CM | POA: Diagnosis not present

## 2023-10-27 DIAGNOSIS — H9193 Unspecified hearing loss, bilateral: Secondary | ICD-10-CM | POA: Diagnosis not present

## 2023-10-27 DIAGNOSIS — M5416 Radiculopathy, lumbar region: Secondary | ICD-10-CM | POA: Diagnosis not present

## 2023-10-27 DIAGNOSIS — N3281 Overactive bladder: Secondary | ICD-10-CM | POA: Diagnosis not present

## 2023-10-27 DIAGNOSIS — Z981 Arthrodesis status: Secondary | ICD-10-CM | POA: Diagnosis not present

## 2023-10-27 DIAGNOSIS — M19019 Primary osteoarthritis, unspecified shoulder: Secondary | ICD-10-CM | POA: Diagnosis not present

## 2023-10-27 DIAGNOSIS — M25512 Pain in left shoulder: Secondary | ICD-10-CM | POA: Diagnosis not present

## 2023-10-27 DIAGNOSIS — E785 Hyperlipidemia, unspecified: Secondary | ICD-10-CM | POA: Diagnosis not present

## 2023-10-27 DIAGNOSIS — I2510 Atherosclerotic heart disease of native coronary artery without angina pectoris: Secondary | ICD-10-CM | POA: Diagnosis not present

## 2023-10-27 DIAGNOSIS — J309 Allergic rhinitis, unspecified: Secondary | ICD-10-CM | POA: Diagnosis not present

## 2023-10-27 DIAGNOSIS — M21371 Foot drop, right foot: Secondary | ICD-10-CM | POA: Diagnosis not present

## 2023-10-27 DIAGNOSIS — R490 Dysphonia: Secondary | ICD-10-CM | POA: Diagnosis not present

## 2023-10-27 DIAGNOSIS — G8929 Other chronic pain: Secondary | ICD-10-CM | POA: Diagnosis not present

## 2023-10-27 DIAGNOSIS — G5733 Lesion of lateral popliteal nerve, bilateral lower limbs: Secondary | ICD-10-CM | POA: Diagnosis not present

## 2023-10-27 DIAGNOSIS — G250 Essential tremor: Secondary | ICD-10-CM | POA: Diagnosis not present

## 2023-10-27 DIAGNOSIS — K279 Peptic ulcer, site unspecified, unspecified as acute or chronic, without hemorrhage or perforation: Secondary | ICD-10-CM | POA: Diagnosis not present

## 2023-10-27 DIAGNOSIS — I10 Essential (primary) hypertension: Secondary | ICD-10-CM | POA: Diagnosis not present

## 2023-10-27 DIAGNOSIS — Z951 Presence of aortocoronary bypass graft: Secondary | ICD-10-CM | POA: Diagnosis not present

## 2023-10-28 DIAGNOSIS — I10 Essential (primary) hypertension: Secondary | ICD-10-CM | POA: Diagnosis not present

## 2023-10-28 DIAGNOSIS — H268 Other specified cataract: Secondary | ICD-10-CM | POA: Diagnosis not present

## 2023-10-28 DIAGNOSIS — Z981 Arthrodesis status: Secondary | ICD-10-CM | POA: Diagnosis not present

## 2023-10-28 DIAGNOSIS — N3281 Overactive bladder: Secondary | ICD-10-CM | POA: Diagnosis not present

## 2023-10-28 DIAGNOSIS — E785 Hyperlipidemia, unspecified: Secondary | ICD-10-CM | POA: Diagnosis not present

## 2023-10-28 DIAGNOSIS — M19019 Primary osteoarthritis, unspecified shoulder: Secondary | ICD-10-CM | POA: Diagnosis not present

## 2023-10-28 DIAGNOSIS — J309 Allergic rhinitis, unspecified: Secondary | ICD-10-CM | POA: Diagnosis not present

## 2023-10-28 DIAGNOSIS — G5733 Lesion of lateral popliteal nerve, bilateral lower limbs: Secondary | ICD-10-CM | POA: Diagnosis not present

## 2023-10-28 DIAGNOSIS — I2510 Atherosclerotic heart disease of native coronary artery without angina pectoris: Secondary | ICD-10-CM | POA: Diagnosis not present

## 2023-10-28 DIAGNOSIS — R490 Dysphonia: Secondary | ICD-10-CM | POA: Diagnosis not present

## 2023-10-28 DIAGNOSIS — M25512 Pain in left shoulder: Secondary | ICD-10-CM | POA: Diagnosis not present

## 2023-10-28 DIAGNOSIS — M5416 Radiculopathy, lumbar region: Secondary | ICD-10-CM | POA: Diagnosis not present

## 2023-10-28 DIAGNOSIS — M21371 Foot drop, right foot: Secondary | ICD-10-CM | POA: Diagnosis not present

## 2023-10-28 DIAGNOSIS — G8929 Other chronic pain: Secondary | ICD-10-CM | POA: Diagnosis not present

## 2023-10-28 DIAGNOSIS — G250 Essential tremor: Secondary | ICD-10-CM | POA: Diagnosis not present

## 2023-10-28 DIAGNOSIS — H25811 Combined forms of age-related cataract, right eye: Secondary | ICD-10-CM | POA: Diagnosis not present

## 2023-10-28 DIAGNOSIS — Z7982 Long term (current) use of aspirin: Secondary | ICD-10-CM | POA: Diagnosis not present

## 2023-10-28 DIAGNOSIS — K279 Peptic ulcer, site unspecified, unspecified as acute or chronic, without hemorrhage or perforation: Secondary | ICD-10-CM | POA: Diagnosis not present

## 2023-10-28 DIAGNOSIS — Z951 Presence of aortocoronary bypass graft: Secondary | ICD-10-CM | POA: Diagnosis not present

## 2023-10-28 DIAGNOSIS — H9193 Unspecified hearing loss, bilateral: Secondary | ICD-10-CM | POA: Diagnosis not present

## 2023-10-28 DIAGNOSIS — D692 Other nonthrombocytopenic purpura: Secondary | ICD-10-CM | POA: Diagnosis not present

## 2023-10-29 ENCOUNTER — Ambulatory Visit (INDEPENDENT_AMBULATORY_CARE_PROVIDER_SITE_OTHER): Payer: Medicare Other | Admitting: Internal Medicine

## 2023-10-29 VITALS — BP 122/64 | HR 67 | Temp 98.7°F | Ht 70.0 in | Wt 166.0 lb

## 2023-10-29 DIAGNOSIS — I10 Essential (primary) hypertension: Secondary | ICD-10-CM | POA: Diagnosis not present

## 2023-10-29 DIAGNOSIS — R296 Repeated falls: Secondary | ICD-10-CM

## 2023-10-29 DIAGNOSIS — R55 Syncope and collapse: Secondary | ICD-10-CM | POA: Diagnosis not present

## 2023-10-29 DIAGNOSIS — J309 Allergic rhinitis, unspecified: Secondary | ICD-10-CM

## 2023-10-29 DIAGNOSIS — R7302 Impaired glucose tolerance (oral): Secondary | ICD-10-CM | POA: Diagnosis not present

## 2023-10-29 DIAGNOSIS — F03A4 Unspecified dementia, mild, with anxiety: Secondary | ICD-10-CM

## 2023-10-29 NOTE — Progress Notes (Signed)
Patient ID: Ronald [name], male   DOB: 08-Aug-1930, 89 y.o.   MRN: [id number]        Chief Complaint: follow up recent syncope and recurrent falls, htn, hyperglycemia, dementia, allergies       HPI:  Ronald [name] is a 89 y.o. male here with c/o falls x 4 in the past month, one with left periorbital and left arm bruising; pt states seems to have a click in the crown of the head feeling then dizzy and passes out.  Seen at ED with multiple labs and Head CT, with echo ordered, cardiology referral.  Beta blocker stopped and he does feel improved, no further symptoms from jan 30.  Pt denies chest pain, increased sob or doe, wheezing, orthopnea, PND, increased LE swelling, palpitations,  Pt denies polydipsia, polyuria, or new focal neuro s/s.    Pt denies fever, wt loss, night sweats, loss of appetite, or other constitutional symptoms Dementia overall stable symptomatically, and not assoc with behavioral changes such as hallucinations, paranoia, or agitation.  Does have several wks ongoing nasal allergy symptoms with clearish congestion, itch and sneezing       Wt Readings from Last 3 Encounters:  10/29/23 166 lb (75.3 kg)  10/20/23 162 lb (73.5 kg)  10/16/23 160 lb (72.6 kg)   BP Readings from Last 3 Encounters:  10/29/23 122/64  10/20/23 (!) 132/56  10/16/23 135/71         Past Medical History:  Diagnosis Date   ALLERGIC RHINITIS 11/25/2007   ANXIETY 11/25/2007   COLONIC POLYPS, HX OF 11/25/2007   CORONARY ARTERY DISEASE 11/25/2007   Cough 01/04/2009   HEMORRHOIDS, INTERNAL 11/06/2010   HOARSENESS 01/04/2009   HYPERLIPIDEMIA 11/25/2007   HYPERTENSION 11/25/2007   Impaired glucose tolerance 04/21/2013   LOW BACK PAIN 11/25/2007   Lumbar radiculopathy, chronic    with right foot drop   PEPTIC ULCER DISEASE 11/25/2007   Peroneal nerve lesion of lower extremity, 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 He has never used smokeless tobacco. He reports that he does not currently use alcohol. He reports that he does not use drugs. family history includes Colon cancer in his father. No Active Allergies Current Outpatient Medications on File Prior to Visit  Medication Sig Dispense Refill   atorvastatin (LIPITOR) 40 MG tablet Take 1 tablet (40 mg total) by mouth every evening. 90 tablet 2   b complex vitamins tablet Take 1 tablet by mouth daily.     Cholecalciferol (VITAMIN D) 2000 UNITS CAPS Take 2,000 Units by mouth daily.     citalopram (CELEXA) 20 MG tablet Take 1 tablet (20 mg total) by mouth daily. 90 tablet 3   donepezil (ARICEPT) 10 MG tablet Take 1 tablet (10 mg total) by mouth at bedtime. 90 tablet 3   GEMTESA 75 MG TABS SMARTSIG:1 Tablet(s) By Mouth Every Evening     ketoconazole (NIZORAL) 2 % cream APPLY  CREAM TOPICALLY TO AFFECTED AREA ONCE DAILY 30 g 0   levocetirizine (XYZAL) 5 MG tablet TAKE 1 TABLET BY MOUTH ONCE DAILY  AS NEEDED FOR ALLERGIES 90 tablet 0   memantine (NAMENDA) 10 MG tablet Take 1 tablet (10 mg total) by mouth 2 (two) times daily. 60 tablet 11   telmisartan (MICARDIS) 40 MG tablet Take 1 tablet by mouth once daily 90 tablet 3   torsemide (DEMADEX) 10 MG tablet Take 1 tablet (10 mg total) by mouth as needed. 30 tablet 3   aspirin 81 MG tablet Take 81 mg by mouth daily. (Patient not taking: Reported on 10/29/2023)     clotrimazole-betamethasone (LOTRISONE) cream Use as directed twice per day as needed (Patient not taking: Reported on 10/29/2023) 45 g 1   triamcinolone (NASACORT) 55 MCG/ACT AERO nasal inhaler Place 2 sprays into the nose daily. (Patient not taking: Reported on 10/29/2023) 1 each 12   No current facility-administered  medications on file prior to visit.        ROS:  All others reviewed and negative.  Objective        PE:  BP 122/64 (BP Location: Right Arm, Patient Position: Sitting, Cuff Size: Normal)   Pulse 67   Temp 98.7 F (37.1 C) (Oral)   Ht 5\' 10"  (1.778 m)   Wt 166 lb (75.3 kg)   SpO2 98%   BMI 23.82 kg/m                 Constitutional: Pt appears in NAD               HENT: Head: NCAT.                Right Ear: External ear normal.                 Left Ear: External ear normal.                Eyes: . Pupils are equal, round, and reactive to light. Conjunctivae and EOM are normal               Nose: without d/c or deformity               Neck: Neck supple. Gross normal ROM               Cardiovascular: Normal rate and regular rhythm.                 Pulmonary/Chest: Effort normal and breath sounds without rales or wheezing.                Abd:  Soft, NT, ND, + BS, no organomegaly               Neurological: Pt is alert. At baseline orientation, motor grossly intact               Skin: Skin is warm. No rashes, no other new lesions, LE edema - none               Psychiatric: Pt behavior is normal without agitation   Micro: none  Cardiac tracings I have personally interpreted today:  none  Pertinent Radiological findings (summarize): none   Lab Results  Component Value Date   WBC 5.2 10/16/2023   HGB 9.6 (L) 10/16/2023   HCT 30.0 (L) 10/16/2023   PLT 208 10/16/2023   GLUCOSE 148 (H) 10/16/2023   CHOL 134 10/08/2022   TRIG 56.0 10/08/2022   HDL 61.50 10/08/2022   LDLCALC 62 10/08/2022   ALT 20 10/16/2023   AST 25 10/16/2023   NA 138 10/16/2023  K 4.5 10/16/2023   CL 108 10/16/2023   CREATININE 1.20 10/16/2023   BUN 27 (H) 10/16/2023   CO2 23 10/16/2023   TSH 2.78 06/25/2023   PSA 2.00 01/25/2010   HGBA1C 6.5 10/08/2022   Assessment/Plan:  Ronald [name] is a 89 y.o. 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  Syncope Etiology unclear, also for carotid u/s r/o vertebrobasilar insufficiency  Frequent falls ? BB related vs other, improved it seems for now, cont to monitor, declines further PT for now  Essential hypertension Stable to improved off BB -  BP Readings from Last 3 Encounters:  10/29/23 122/64  10/20/23 (!) 132/56  10/16/23 135/71    to f/u any worsening symptoms or concerns  Dementia (HCC) Overall stable, cont current med tx  Impaired glucose tolerance Lab Results  Component Value Date   HGBA1C 6.5 10/08/2022   Stable, pt to continue current medical treatment  - diet,wt control   Allergic rhinitis Mild, for otc antihistamine and /or nasacort asd,  to f/u any worsening symptoms or concerns  Followup: Return in about 3 months (around 01/26/2024).  Oliver Barre, MD 11/01/2023 3:52 PM [facility]

## 2023-10-29 NOTE — Patient Instructions (Signed)
I agree with stopping the propranolol, as this may have been the cause  Please continue all other medications as before, including the micardis for now  Please have the pharmacy call with any other refills you may need.  Please keep your appointments with your specialists as you may have planned - the echocardiogram  I have also ordered the Carotid Ultrasound, unless this is done with the Echocardiogram itself  The heart monitor is also an excellent idea  You should not be driving for at least 3-4 wks during the evaluation for these episodes of falling  Please call if you would think that you might need Physical Therapy, but it does not appear that this is needed today  Please make an Appointment to return in 3 months, or sooner if needed

## 2023-11-01 ENCOUNTER — Encounter: Payer: Self-pay | Admitting: Internal Medicine

## 2023-11-01 DIAGNOSIS — R55 Syncope and collapse: Secondary | ICD-10-CM | POA: Insufficient documentation

## 2023-11-01 NOTE — Assessment & Plan Note (Signed)
Mild, for otc antihistamine and /or nasacort asd,  to f/u any worsening symptoms or concerns

## 2023-11-01 NOTE — Assessment & Plan Note (Signed)
Stable to improved off BB -  BP Readings from Last 3 Encounters:  10/29/23 122/64  10/20/23 (!) 132/56  10/16/23 135/71    to f/u any worsening symptoms or concerns

## 2023-11-01 NOTE — Assessment & Plan Note (Signed)
Lab Results  Component Value Date   HGBA1C 6.5 10/08/2022   Stable, pt to continue current medical treatment  - diet, wt control

## 2023-11-01 NOTE — Assessment & Plan Note (Signed)
?   BB related vs other, improved it seems for now, cont to monitor, declines further PT for now

## 2023-11-01 NOTE — Assessment & Plan Note (Signed)
Overall stable, cont current med tx 

## 2023-11-01 NOTE — Assessment & Plan Note (Signed)
Etiology unclear, also for carotid u/s r/o vertebrobasilar insufficiency

## 2023-11-03 DIAGNOSIS — M25512 Pain in left shoulder: Secondary | ICD-10-CM | POA: Diagnosis not present

## 2023-11-03 DIAGNOSIS — I2510 Atherosclerotic heart disease of native coronary artery without angina pectoris: Secondary | ICD-10-CM | POA: Diagnosis not present

## 2023-11-03 DIAGNOSIS — J309 Allergic rhinitis, unspecified: Secondary | ICD-10-CM | POA: Diagnosis not present

## 2023-11-03 DIAGNOSIS — R490 Dysphonia: Secondary | ICD-10-CM | POA: Diagnosis not present

## 2023-11-03 DIAGNOSIS — E785 Hyperlipidemia, unspecified: Secondary | ICD-10-CM | POA: Diagnosis not present

## 2023-11-03 DIAGNOSIS — H9193 Unspecified hearing loss, bilateral: Secondary | ICD-10-CM | POA: Diagnosis not present

## 2023-11-03 DIAGNOSIS — K279 Peptic ulcer, site unspecified, unspecified as acute or chronic, without hemorrhage or perforation: Secondary | ICD-10-CM | POA: Diagnosis not present

## 2023-11-03 DIAGNOSIS — M19019 Primary osteoarthritis, unspecified shoulder: Secondary | ICD-10-CM | POA: Diagnosis not present

## 2023-11-03 DIAGNOSIS — G8929 Other chronic pain: Secondary | ICD-10-CM | POA: Diagnosis not present

## 2023-11-03 DIAGNOSIS — M5416 Radiculopathy, lumbar region: Secondary | ICD-10-CM | POA: Diagnosis not present

## 2023-11-03 DIAGNOSIS — Z7982 Long term (current) use of aspirin: Secondary | ICD-10-CM | POA: Diagnosis not present

## 2023-11-03 DIAGNOSIS — Z951 Presence of aortocoronary bypass graft: Secondary | ICD-10-CM | POA: Diagnosis not present

## 2023-11-03 DIAGNOSIS — D692 Other nonthrombocytopenic purpura: Secondary | ICD-10-CM | POA: Diagnosis not present

## 2023-11-03 DIAGNOSIS — I10 Essential (primary) hypertension: Secondary | ICD-10-CM | POA: Diagnosis not present

## 2023-11-03 DIAGNOSIS — G250 Essential tremor: Secondary | ICD-10-CM | POA: Diagnosis not present

## 2023-11-03 DIAGNOSIS — M21371 Foot drop, right foot: Secondary | ICD-10-CM | POA: Diagnosis not present

## 2023-11-03 DIAGNOSIS — G5733 Lesion of lateral popliteal nerve, bilateral lower limbs: Secondary | ICD-10-CM | POA: Diagnosis not present

## 2023-11-03 DIAGNOSIS — N3281 Overactive bladder: Secondary | ICD-10-CM | POA: Diagnosis not present

## 2023-11-03 DIAGNOSIS — Z981 Arthrodesis status: Secondary | ICD-10-CM | POA: Diagnosis not present

## 2023-11-05 DIAGNOSIS — H25812 Combined forms of age-related cataract, left eye: Secondary | ICD-10-CM | POA: Diagnosis not present

## 2023-11-06 ENCOUNTER — Telehealth: Payer: Self-pay

## 2023-11-06 DIAGNOSIS — G8929 Other chronic pain: Secondary | ICD-10-CM | POA: Diagnosis not present

## 2023-11-06 DIAGNOSIS — H9193 Unspecified hearing loss, bilateral: Secondary | ICD-10-CM | POA: Diagnosis not present

## 2023-11-06 DIAGNOSIS — M5416 Radiculopathy, lumbar region: Secondary | ICD-10-CM | POA: Diagnosis not present

## 2023-11-06 DIAGNOSIS — G5733 Lesion of lateral popliteal nerve, bilateral lower limbs: Secondary | ICD-10-CM | POA: Diagnosis not present

## 2023-11-06 DIAGNOSIS — I2510 Atherosclerotic heart disease of native coronary artery without angina pectoris: Secondary | ICD-10-CM | POA: Diagnosis not present

## 2023-11-06 DIAGNOSIS — K279 Peptic ulcer, site unspecified, unspecified as acute or chronic, without hemorrhage or perforation: Secondary | ICD-10-CM | POA: Diagnosis not present

## 2023-11-06 DIAGNOSIS — E785 Hyperlipidemia, unspecified: Secondary | ICD-10-CM | POA: Diagnosis not present

## 2023-11-06 DIAGNOSIS — I10 Essential (primary) hypertension: Secondary | ICD-10-CM | POA: Diagnosis not present

## 2023-11-06 DIAGNOSIS — Z7982 Long term (current) use of aspirin: Secondary | ICD-10-CM | POA: Diagnosis not present

## 2023-11-06 DIAGNOSIS — G250 Essential tremor: Secondary | ICD-10-CM | POA: Diagnosis not present

## 2023-11-06 DIAGNOSIS — N3281 Overactive bladder: Secondary | ICD-10-CM | POA: Diagnosis not present

## 2023-11-06 DIAGNOSIS — D692 Other nonthrombocytopenic purpura: Secondary | ICD-10-CM | POA: Diagnosis not present

## 2023-11-06 DIAGNOSIS — Z951 Presence of aortocoronary bypass graft: Secondary | ICD-10-CM | POA: Diagnosis not present

## 2023-11-06 DIAGNOSIS — J309 Allergic rhinitis, unspecified: Secondary | ICD-10-CM | POA: Diagnosis not present

## 2023-11-06 DIAGNOSIS — Z981 Arthrodesis status: Secondary | ICD-10-CM | POA: Diagnosis not present

## 2023-11-06 DIAGNOSIS — M25512 Pain in left shoulder: Secondary | ICD-10-CM | POA: Diagnosis not present

## 2023-11-06 DIAGNOSIS — M19019 Primary osteoarthritis, unspecified shoulder: Secondary | ICD-10-CM | POA: Diagnosis not present

## 2023-11-06 DIAGNOSIS — R490 Dysphonia: Secondary | ICD-10-CM | POA: Diagnosis not present

## 2023-11-06 DIAGNOSIS — M21371 Foot drop, right foot: Secondary | ICD-10-CM | POA: Diagnosis not present

## 2023-11-06 NOTE — Telephone Encounter (Signed)
Copied from CRM [id number]. Topic: Clinical - Home Health Verbal Orders >> Nov 06, 2023  4:42 PM Myrtice Lauth wrote:  Caller/Agency: [facility]  Callback Number: [phone]  Service Requested: Physical Therapy Frequency: 1 week 8,  Any new concerns about the patient? no

## 2023-11-07 NOTE — Telephone Encounter (Signed)
 Ok for verbal

## 2023-11-07 NOTE — Telephone Encounter (Signed)
 Called and gave verbals.

## 2023-11-10 ENCOUNTER — Ambulatory Visit (HOSPITAL_COMMUNITY)
Admission: RE | Admit: 2023-11-10 | Discharge: 2023-11-10 | Disposition: A | Discharge status: Home / Self Care | Payer: Medicare Other | Source: Ambulatory Visit | Attending: Internal Medicine | Admitting: Internal Medicine

## 2023-11-10 ENCOUNTER — Ambulatory Visit (HOSPITAL_BASED_OUTPATIENT_CLINIC_OR_DEPARTMENT_OTHER): Payer: Medicare Other

## 2023-11-10 ENCOUNTER — Telehealth: Payer: Self-pay | Admitting: Internal Medicine

## 2023-11-10 ENCOUNTER — Encounter: Payer: Self-pay | Admitting: Internal Medicine

## 2023-11-10 DIAGNOSIS — R269 Unspecified abnormalities of gait and mobility: Secondary | ICD-10-CM

## 2023-11-10 DIAGNOSIS — R55 Syncope and collapse: Secondary | ICD-10-CM

## 2023-11-10 DIAGNOSIS — R296 Repeated falls: Secondary | ICD-10-CM

## 2023-11-10 LAB — ECHOCARDIOGRAM COMPLETE
Area-P 1/2: 3.6 cm2
S' Lateral: 3.4 cm

## 2023-11-10 NOTE — Telephone Encounter (Unsigned)
 Copied from CRM [id number]. Topic: Clinical - Home Health Verbal Orders >> Nov 10, 2023  3:38 PM Ronald [name] wrote: Patient daughter in law called in to get update on rollator for patient due to him steady falling. Stated order was also placed by [facility] worker as well. Please call [phone]

## 2023-11-10 NOTE — Telephone Encounter (Signed)
Ok done hardcopy to cma 

## 2023-11-11 DIAGNOSIS — R55 Syncope and collapse: Secondary | ICD-10-CM | POA: Diagnosis not present

## 2023-11-12 DIAGNOSIS — Z981 Arthrodesis status: Secondary | ICD-10-CM | POA: Diagnosis not present

## 2023-11-12 DIAGNOSIS — Z951 Presence of aortocoronary bypass graft: Secondary | ICD-10-CM | POA: Diagnosis not present

## 2023-11-12 DIAGNOSIS — K279 Peptic ulcer, site unspecified, unspecified as acute or chronic, without hemorrhage or perforation: Secondary | ICD-10-CM | POA: Diagnosis not present

## 2023-11-12 DIAGNOSIS — H9193 Unspecified hearing loss, bilateral: Secondary | ICD-10-CM | POA: Diagnosis not present

## 2023-11-12 DIAGNOSIS — M25512 Pain in left shoulder: Secondary | ICD-10-CM | POA: Diagnosis not present

## 2023-11-12 DIAGNOSIS — Z7982 Long term (current) use of aspirin: Secondary | ICD-10-CM | POA: Diagnosis not present

## 2023-11-12 DIAGNOSIS — G8929 Other chronic pain: Secondary | ICD-10-CM | POA: Diagnosis not present

## 2023-11-12 DIAGNOSIS — D692 Other nonthrombocytopenic purpura: Secondary | ICD-10-CM | POA: Diagnosis not present

## 2023-11-12 DIAGNOSIS — I2510 Atherosclerotic heart disease of native coronary artery without angina pectoris: Secondary | ICD-10-CM | POA: Diagnosis not present

## 2023-11-12 DIAGNOSIS — I10 Essential (primary) hypertension: Secondary | ICD-10-CM | POA: Diagnosis not present

## 2023-11-12 DIAGNOSIS — M19019 Primary osteoarthritis, unspecified shoulder: Secondary | ICD-10-CM | POA: Diagnosis not present

## 2023-11-12 DIAGNOSIS — E785 Hyperlipidemia, unspecified: Secondary | ICD-10-CM | POA: Diagnosis not present

## 2023-11-12 DIAGNOSIS — G5733 Lesion of lateral popliteal nerve, bilateral lower limbs: Secondary | ICD-10-CM | POA: Diagnosis not present

## 2023-11-12 DIAGNOSIS — G250 Essential tremor: Secondary | ICD-10-CM | POA: Diagnosis not present

## 2023-11-12 DIAGNOSIS — N3281 Overactive bladder: Secondary | ICD-10-CM | POA: Diagnosis not present

## 2023-11-12 DIAGNOSIS — J309 Allergic rhinitis, unspecified: Secondary | ICD-10-CM | POA: Diagnosis not present

## 2023-11-12 DIAGNOSIS — M21371 Foot drop, right foot: Secondary | ICD-10-CM | POA: Diagnosis not present

## 2023-11-12 DIAGNOSIS — M5416 Radiculopathy, lumbar region: Secondary | ICD-10-CM | POA: Diagnosis not present

## 2023-11-12 DIAGNOSIS — R490 Dysphonia: Secondary | ICD-10-CM | POA: Diagnosis not present

## 2023-11-12 NOTE — Telephone Encounter (Signed)
 Order has been faxed to [employer]

## 2023-11-14 MED ORDER — METOPROLOL TARTRATE 25 MG PO TABS
12.5000 mg | ORAL_TABLET | Freq: Two times a day (BID) | ORAL | 3 refills | Status: DC
Start: 2023-11-14 — End: 2023-11-21

## 2023-11-14 NOTE — Addendum Note (Signed)
 Addended by: Jeannette How A on: 11/14/2023 04:52 PM   Modules accepted: Orders

## 2023-11-17 DIAGNOSIS — H9193 Unspecified hearing loss, bilateral: Secondary | ICD-10-CM | POA: Diagnosis not present

## 2023-11-17 DIAGNOSIS — R490 Dysphonia: Secondary | ICD-10-CM | POA: Diagnosis not present

## 2023-11-17 DIAGNOSIS — J309 Allergic rhinitis, unspecified: Secondary | ICD-10-CM | POA: Diagnosis not present

## 2023-11-17 DIAGNOSIS — Z981 Arthrodesis status: Secondary | ICD-10-CM | POA: Diagnosis not present

## 2023-11-17 DIAGNOSIS — I10 Essential (primary) hypertension: Secondary | ICD-10-CM | POA: Diagnosis not present

## 2023-11-17 DIAGNOSIS — M25512 Pain in left shoulder: Secondary | ICD-10-CM | POA: Diagnosis not present

## 2023-11-17 DIAGNOSIS — Z7982 Long term (current) use of aspirin: Secondary | ICD-10-CM | POA: Diagnosis not present

## 2023-11-17 DIAGNOSIS — G8929 Other chronic pain: Secondary | ICD-10-CM | POA: Diagnosis not present

## 2023-11-17 DIAGNOSIS — N3281 Overactive bladder: Secondary | ICD-10-CM | POA: Diagnosis not present

## 2023-11-17 DIAGNOSIS — G5733 Lesion of lateral popliteal nerve, bilateral lower limbs: Secondary | ICD-10-CM | POA: Diagnosis not present

## 2023-11-17 DIAGNOSIS — M5416 Radiculopathy, lumbar region: Secondary | ICD-10-CM | POA: Diagnosis not present

## 2023-11-17 DIAGNOSIS — G250 Essential tremor: Secondary | ICD-10-CM | POA: Diagnosis not present

## 2023-11-17 DIAGNOSIS — D692 Other nonthrombocytopenic purpura: Secondary | ICD-10-CM | POA: Diagnosis not present

## 2023-11-17 DIAGNOSIS — K279 Peptic ulcer, site unspecified, unspecified as acute or chronic, without hemorrhage or perforation: Secondary | ICD-10-CM | POA: Diagnosis not present

## 2023-11-17 DIAGNOSIS — I2510 Atherosclerotic heart disease of native coronary artery without angina pectoris: Secondary | ICD-10-CM | POA: Diagnosis not present

## 2023-11-17 DIAGNOSIS — E785 Hyperlipidemia, unspecified: Secondary | ICD-10-CM | POA: Diagnosis not present

## 2023-11-17 DIAGNOSIS — M21371 Foot drop, right foot: Secondary | ICD-10-CM | POA: Diagnosis not present

## 2023-11-17 DIAGNOSIS — M19019 Primary osteoarthritis, unspecified shoulder: Secondary | ICD-10-CM | POA: Diagnosis not present

## 2023-11-17 DIAGNOSIS — Z951 Presence of aortocoronary bypass graft: Secondary | ICD-10-CM | POA: Diagnosis not present

## 2023-11-20 ENCOUNTER — Telehealth: Payer: Self-pay | Admitting: Cardiovascular Disease

## 2023-11-20 NOTE — Telephone Encounter (Signed)
 Daughter is calling to speak with the nurse about the changes that has been made to the patient medication. Please advise

## 2023-11-20 NOTE — Telephone Encounter (Signed)
 Called patient's daughter-in-law back about message. She stated she was just really concerned about patient's latest medication change. She stated patient saw Georgie Chard, NP on 10/20/23 and was ordered a monitor. After monitor was resulted, Noreene Larsson started patient on Lopressor 12.5 mg BID for frequent SVT or supraventricular tachycardia. Prior to visit with Noreene Larsson patient was taken off of propranolol due to syncope. Patient has been taking lopressor without any issues. Will send message to Dr. Eden Emms for further advisement.

## 2023-11-21 MED ORDER — METOPROLOL TARTRATE 25 MG PO TABS: 12.5000 mg | ORAL_TABLET | Freq: Every morning | ORAL | Status: DC

## 2023-11-21 NOTE — Telephone Encounter (Signed)
 Per Dr. Eden Emms, average HR on monitor in 70's would only take 12.5 mg in am if pulse > 60. Called patient's daughter-in-law back with recommendations. She verbalized understanding.

## 2023-11-24 DIAGNOSIS — M19019 Primary osteoarthritis, unspecified shoulder: Secondary | ICD-10-CM | POA: Diagnosis not present

## 2023-11-24 DIAGNOSIS — J309 Allergic rhinitis, unspecified: Secondary | ICD-10-CM | POA: Diagnosis not present

## 2023-11-24 DIAGNOSIS — Z951 Presence of aortocoronary bypass graft: Secondary | ICD-10-CM | POA: Diagnosis not present

## 2023-11-24 DIAGNOSIS — G8929 Other chronic pain: Secondary | ICD-10-CM | POA: Diagnosis not present

## 2023-11-24 DIAGNOSIS — G5733 Lesion of lateral popliteal nerve, bilateral lower limbs: Secondary | ICD-10-CM | POA: Diagnosis not present

## 2023-11-24 DIAGNOSIS — I10 Essential (primary) hypertension: Secondary | ICD-10-CM | POA: Diagnosis not present

## 2023-11-24 DIAGNOSIS — K279 Peptic ulcer, site unspecified, unspecified as acute or chronic, without hemorrhage or perforation: Secondary | ICD-10-CM | POA: Diagnosis not present

## 2023-11-24 DIAGNOSIS — Z981 Arthrodesis status: Secondary | ICD-10-CM | POA: Diagnosis not present

## 2023-11-24 DIAGNOSIS — H9193 Unspecified hearing loss, bilateral: Secondary | ICD-10-CM | POA: Diagnosis not present

## 2023-11-24 DIAGNOSIS — I2510 Atherosclerotic heart disease of native coronary artery without angina pectoris: Secondary | ICD-10-CM | POA: Diagnosis not present

## 2023-11-24 DIAGNOSIS — D692 Other nonthrombocytopenic purpura: Secondary | ICD-10-CM | POA: Diagnosis not present

## 2023-11-24 DIAGNOSIS — E785 Hyperlipidemia, unspecified: Secondary | ICD-10-CM | POA: Diagnosis not present

## 2023-11-24 DIAGNOSIS — Z7982 Long term (current) use of aspirin: Secondary | ICD-10-CM | POA: Diagnosis not present

## 2023-11-24 DIAGNOSIS — M21371 Foot drop, right foot: Secondary | ICD-10-CM | POA: Diagnosis not present

## 2023-11-24 DIAGNOSIS — M25512 Pain in left shoulder: Secondary | ICD-10-CM | POA: Diagnosis not present

## 2023-11-24 DIAGNOSIS — R490 Dysphonia: Secondary | ICD-10-CM | POA: Diagnosis not present

## 2023-11-24 DIAGNOSIS — N3281 Overactive bladder: Secondary | ICD-10-CM | POA: Diagnosis not present

## 2023-11-24 DIAGNOSIS — M5416 Radiculopathy, lumbar region: Secondary | ICD-10-CM | POA: Diagnosis not present

## 2023-11-24 DIAGNOSIS — G250 Essential tremor: Secondary | ICD-10-CM | POA: Diagnosis not present

## 2023-11-25 DIAGNOSIS — H25812 Combined forms of age-related cataract, left eye: Secondary | ICD-10-CM | POA: Diagnosis not present

## 2023-11-25 DIAGNOSIS — H268 Other specified cataract: Secondary | ICD-10-CM | POA: Diagnosis not present

## 2023-11-26 NOTE — Telephone Encounter (Signed)
 Copied from CRM [id number]. Topic: General - Other >> Nov 26, 2023  3:43 PM Jon Gills C wrote: Reason for CRM: Patient daughter in law Darel Hong called in regarding the  rollator , I informed her that per note the order has been faxed to [facility], she would like for someone to reach out to her for a number to [employer] to get the rollator as she stated no one has called her yet for that would like for some to give her a call regarding this

## 2023-11-28 ENCOUNTER — Ambulatory Visit: Payer: Medicare Other | Admitting: Internal Medicine

## 2023-12-03 DIAGNOSIS — D692 Other nonthrombocytopenic purpura: Secondary | ICD-10-CM | POA: Diagnosis not present

## 2023-12-03 DIAGNOSIS — G8929 Other chronic pain: Secondary | ICD-10-CM | POA: Diagnosis not present

## 2023-12-03 DIAGNOSIS — J309 Allergic rhinitis, unspecified: Secondary | ICD-10-CM | POA: Diagnosis not present

## 2023-12-03 DIAGNOSIS — I10 Essential (primary) hypertension: Secondary | ICD-10-CM | POA: Diagnosis not present

## 2023-12-03 DIAGNOSIS — M25512 Pain in left shoulder: Secondary | ICD-10-CM | POA: Diagnosis not present

## 2023-12-03 DIAGNOSIS — E785 Hyperlipidemia, unspecified: Secondary | ICD-10-CM | POA: Diagnosis not present

## 2023-12-03 DIAGNOSIS — Z7982 Long term (current) use of aspirin: Secondary | ICD-10-CM | POA: Diagnosis not present

## 2023-12-03 DIAGNOSIS — Z951 Presence of aortocoronary bypass graft: Secondary | ICD-10-CM | POA: Diagnosis not present

## 2023-12-03 DIAGNOSIS — G5733 Lesion of lateral popliteal nerve, bilateral lower limbs: Secondary | ICD-10-CM | POA: Diagnosis not present

## 2023-12-03 DIAGNOSIS — M19019 Primary osteoarthritis, unspecified shoulder: Secondary | ICD-10-CM | POA: Diagnosis not present

## 2023-12-03 DIAGNOSIS — Z981 Arthrodesis status: Secondary | ICD-10-CM | POA: Diagnosis not present

## 2023-12-03 DIAGNOSIS — K279 Peptic ulcer, site unspecified, unspecified as acute or chronic, without hemorrhage or perforation: Secondary | ICD-10-CM | POA: Diagnosis not present

## 2023-12-03 DIAGNOSIS — N3281 Overactive bladder: Secondary | ICD-10-CM | POA: Diagnosis not present

## 2023-12-03 DIAGNOSIS — I2510 Atherosclerotic heart disease of native coronary artery without angina pectoris: Secondary | ICD-10-CM | POA: Diagnosis not present

## 2023-12-03 DIAGNOSIS — M21371 Foot drop, right foot: Secondary | ICD-10-CM | POA: Diagnosis not present

## 2023-12-03 DIAGNOSIS — M5416 Radiculopathy, lumbar region: Secondary | ICD-10-CM | POA: Diagnosis not present

## 2023-12-03 DIAGNOSIS — R490 Dysphonia: Secondary | ICD-10-CM | POA: Diagnosis not present

## 2023-12-03 DIAGNOSIS — H9193 Unspecified hearing loss, bilateral: Secondary | ICD-10-CM | POA: Diagnosis not present

## 2023-12-03 DIAGNOSIS — G250 Essential tremor: Secondary | ICD-10-CM | POA: Diagnosis not present

## 2023-12-04 NOTE — Progress Notes (Unsigned)
 Cardiology Office Note    Patient Name: Ronald [name] Date of Encounter: 12/05/2023  Primary Care Provider:  [name], MD Primary Cardiologist:  Charlton Haws, MD Primary Electrophysiologist: None   Past Medical History    Past Medical History:  Diagnosis Date   ALLERGIC RHINITIS 11/25/2007   ANXIETY 11/25/2007   COLONIC POLYPS, HX OF 11/25/2007   CORONARY ARTERY DISEASE 11/25/2007   Cough 01/04/2009   HEMORRHOIDS, INTERNAL 11/06/2010   HOARSENESS 01/04/2009   HYPERLIPIDEMIA 11/25/2007   HYPERTENSION 11/25/2007   Impaired glucose tolerance 04/21/2013   LOW BACK PAIN 11/25/2007   Lumbar radiculopathy, chronic    with right foot drop   PEPTIC ULCER DISEASE 11/25/2007   Peroneal nerve lesion of lower extremity, 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 male with a PMH of CAD s/p CABG in 1998, HTN, HLD, RBBB, frequent falls who presents today for 6-week follow-up.  [name] has been followed by Dr. Eden Emms since 2014 and was previously seen by Dr. Dickie La.  The patient's cardiovascular history is significant for CABG in 1998 with known occluded SVG to RCA by heart cath in 2003.  He was seen most recently by Georgie Chard, NP for complaint of multiple syncopal episodes.  He was seen in the ED and underwent CT of the head that showed no abnormalities.  During his follow-up EKG was completed showing sinus bradycardia at 55 bpm.  He wore a ZIO monitor for further evaluation that showed frequent episodes of SVT which could be related to his syncopal episodes.  He was advised to start metoprolol 12.5 mg twice daily.  He also completed a carotid duplex showing 1-39% bilateral blockage but no significant causes for syncope.  2D echo was also completed showing EF of 60 to 65% with no RWMA and mild LVH and no evidence of mitral  stenosis or aortic stenosis.  [name] presents today with his daughter-in-law and wife for follow-up. He has a history of syncope and supraventricular tachycardia, with several episodes of syncope leading to an emergency room visit on January 30th. A CT scan of his head showed no abnormalities.  Since starting metoprolol, no further episodes of syncope or palpitations have occurred. No dizziness or other side effects from the medication. No swelling in his feet and no need for a fluid pill. Adequate hydration and no recent infections or colds. He has lost five pounds since his last visit, attributed to eating less due to limited access to food as he has not been driving. He has a good appetite when food is available and enjoys ice cream. He maintains a daily workout routine and uses a stationary bike regularly. He has had cataract surgery on both eyes and reports improved vision. No recurrence of syncope since the medication adjustment and maintains a good level of physical activity. He is concerned about when he can resume driving. Patient denies chest pain, palpitations, dyspnea, PND, orthopnea, nausea, vomiting, dizziness, syncope, edema, weight gain, or early satiety.   Review of Systems  Please see the history of present illness.    All other systems reviewed and are otherwise negative except as noted above.  Physical Exam    Wt Readings from Last 3 Encounters:  12/05/23 161 lb (73 kg)  10/29/23 166 lb (75.3  kg)  10/20/23 162 lb (73.5 kg)   VS: Vitals:   12/05/23 1425  BP: 132/68  Pulse: 76  SpO2: 97%  ,Body mass index is 23.1 kg/m. GEN: Well nourished, well developed in no acute distress Neck: No JVD; No carotid bruits Pulmonary: Clear to auscultation without rales, wheezing or rhonchi  Cardiovascular: Normal rate. Regular rhythm. Normal S1. Normal S2.   Murmurs: There is no murmur.  ABDOMEN: Soft, non-tender, non-distended EXTREMITIES:  No edema; No deformity   EKG/LABS/  Recent Cardiac Studies   ECG personally reviewed by me today -none completed today  Risk Assessment/Calculations:          Lab Results  Component Value Date   WBC 5.2 10/16/2023   HGB 9.6 (L) 10/16/2023   HCT 30.0 (L) 10/16/2023   MCV 116.3 (H) 10/16/2023   PLT 208 10/16/2023   Lab Results  Component Value Date   CREATININE 1.20 10/16/2023   BUN 27 (H) 10/16/2023   NA 138 10/16/2023   K 4.5 10/16/2023   CL 108 10/16/2023   CO2 23 10/16/2023   Lab Results  Component Value Date   CHOL 134 10/08/2022   HDL 61.50 10/08/2022   LDLCALC 62 10/08/2022   TRIG 56.0 10/08/2022   CHOLHDL 2 10/08/2022    Lab Results  Component Value Date   HGBA1C 6.5 10/08/2022   Assessment & Plan    1.  Syncope and collapse: -No recurrence of syncope since metoprolol initiation. Cardiac causes ruled out. Dehydration may contribute. - Follow up with neurology for potential neurological causes. - Ensure 64 ounces of fluid intake daily.  2.  Coronary artery disease: -CABG in 1998 with most recent/p Ischemic evaluation completed by heart cath in 2003. -Patient reports no chest pain or angina since previous follow-up. -Continue ASA 81 mg, Lipitor 40 mg, metoprolol 12.5 mg once daily  3.  Mixed hyperlipidemia: -Patient's last LDL cholesterol was 62 -Continue Lipitor 40 mg daily  4.  Essential hypertension: -Blood pressure controlled at 132/68 mmHg on Micardis. No diuretics needed, no peripheral edema. - Continue Micardis. - Monitor blood pressure regularly.  5.  History of bradycardia with RBBB: -Patient's heart rate today is stable at 76 bpm. -He denies any recurrent syncope or dizziness.  Disposition: Follow-up with Charlton Haws, MD or APP in 6 months   Signed, Napoleon Form, Leodis Rains, NP 12/05/2023, 2:48 PM [facility]

## 2023-12-05 ENCOUNTER — Encounter: Payer: Self-pay | Admitting: Nurse Practitioner

## 2023-12-05 ENCOUNTER — Ambulatory Visit: Payer: Medicare Other | Attending: Cardiovascular Disease | Admitting: Nurse Practitioner

## 2023-12-05 VITALS — BP 132/68 | HR 76 | Wt 161.0 lb

## 2023-12-05 DIAGNOSIS — I25810 Atherosclerosis of coronary artery bypass graft(s) without angina pectoris: Secondary | ICD-10-CM

## 2023-12-05 DIAGNOSIS — R001 Bradycardia, unspecified: Secondary | ICD-10-CM

## 2023-12-05 DIAGNOSIS — I10 Essential (primary) hypertension: Secondary | ICD-10-CM

## 2023-12-05 DIAGNOSIS — R55 Syncope and collapse: Secondary | ICD-10-CM | POA: Diagnosis not present

## 2023-12-05 DIAGNOSIS — E782 Mixed hyperlipidemia: Secondary | ICD-10-CM | POA: Diagnosis not present

## 2023-12-05 NOTE — Patient Instructions (Signed)
Medication Instructions:  Your physician recommends that you continue on your current medications as directed. Please refer to the Current Medication list given to you today.  *If you need a refill on your cardiac medications before your next appointment, please call your pharmacy*   Lab Work: None ordered  If you have labs (blood work) drawn today and your tests are completely normal, you will receive your results only by: MyChart Message (if you have MyChart) OR A paper copy in the mail If you have any lab test that is abnormal or we need to change your treatment, we will call you to review the results.   Testing/Procedures: None ordered   Follow-Up: At [facility], you and your health needs are our priority.  As part of our continuing mission to provide you with exceptional heart care, we have created designated Provider Care Teams.  These Care Teams include your primary Cardiologist (physician) and Advanced Practice Providers (APPs -  Physician Assistants and Nurse Practitioners) who all work together to provide you with the care you need, when you need it.  We recommend signing up for the patient portal called "MyChart".  Sign up information is provided on this After Visit Summary.  MyChart is used to connect with patients for Virtual Visits (Telemedicine).  Patients are able to view lab/test results, encounter notes, upcoming appointments, etc.  Non-urgent messages can be sent to your provider as well.   To learn more about what you can do with MyChart, go to [email or web address].    Your next appointment:   6 month(s)  Provider:   Peter Nishan, MD     Other Instructions 

## 2023-12-09 DIAGNOSIS — R490 Dysphonia: Secondary | ICD-10-CM | POA: Diagnosis not present

## 2023-12-09 DIAGNOSIS — Z7982 Long term (current) use of aspirin: Secondary | ICD-10-CM | POA: Diagnosis not present

## 2023-12-09 DIAGNOSIS — Z981 Arthrodesis status: Secondary | ICD-10-CM | POA: Diagnosis not present

## 2023-12-09 DIAGNOSIS — K279 Peptic ulcer, site unspecified, unspecified as acute or chronic, without hemorrhage or perforation: Secondary | ICD-10-CM | POA: Diagnosis not present

## 2023-12-09 DIAGNOSIS — J309 Allergic rhinitis, unspecified: Secondary | ICD-10-CM | POA: Diagnosis not present

## 2023-12-09 DIAGNOSIS — I2510 Atherosclerotic heart disease of native coronary artery without angina pectoris: Secondary | ICD-10-CM | POA: Diagnosis not present

## 2023-12-09 DIAGNOSIS — N3281 Overactive bladder: Secondary | ICD-10-CM | POA: Diagnosis not present

## 2023-12-09 DIAGNOSIS — I10 Essential (primary) hypertension: Secondary | ICD-10-CM | POA: Diagnosis not present

## 2023-12-09 DIAGNOSIS — H9193 Unspecified hearing loss, bilateral: Secondary | ICD-10-CM | POA: Diagnosis not present

## 2023-12-09 DIAGNOSIS — M25512 Pain in left shoulder: Secondary | ICD-10-CM | POA: Diagnosis not present

## 2023-12-09 DIAGNOSIS — E785 Hyperlipidemia, unspecified: Secondary | ICD-10-CM | POA: Diagnosis not present

## 2023-12-09 DIAGNOSIS — Z951 Presence of aortocoronary bypass graft: Secondary | ICD-10-CM | POA: Diagnosis not present

## 2023-12-09 DIAGNOSIS — G5733 Lesion of lateral popliteal nerve, bilateral lower limbs: Secondary | ICD-10-CM | POA: Diagnosis not present

## 2023-12-09 DIAGNOSIS — M19019 Primary osteoarthritis, unspecified shoulder: Secondary | ICD-10-CM | POA: Diagnosis not present

## 2023-12-09 DIAGNOSIS — G8929 Other chronic pain: Secondary | ICD-10-CM | POA: Diagnosis not present

## 2023-12-09 DIAGNOSIS — M5416 Radiculopathy, lumbar region: Secondary | ICD-10-CM | POA: Diagnosis not present

## 2023-12-09 DIAGNOSIS — D692 Other nonthrombocytopenic purpura: Secondary | ICD-10-CM | POA: Diagnosis not present

## 2023-12-09 DIAGNOSIS — M21371 Foot drop, right foot: Secondary | ICD-10-CM | POA: Diagnosis not present

## 2023-12-09 DIAGNOSIS — G250 Essential tremor: Secondary | ICD-10-CM | POA: Diagnosis not present

## 2023-12-14 ENCOUNTER — Other Ambulatory Visit: Payer: Self-pay | Admitting: Internal Medicine

## 2023-12-15 ENCOUNTER — Other Ambulatory Visit: Payer: Self-pay

## 2023-12-16 DIAGNOSIS — I10 Essential (primary) hypertension: Secondary | ICD-10-CM | POA: Diagnosis not present

## 2023-12-16 DIAGNOSIS — M19019 Primary osteoarthritis, unspecified shoulder: Secondary | ICD-10-CM | POA: Diagnosis not present

## 2023-12-16 DIAGNOSIS — M21371 Foot drop, right foot: Secondary | ICD-10-CM | POA: Diagnosis not present

## 2023-12-16 DIAGNOSIS — D692 Other nonthrombocytopenic purpura: Secondary | ICD-10-CM | POA: Diagnosis not present

## 2023-12-16 DIAGNOSIS — G5733 Lesion of lateral popliteal nerve, bilateral lower limbs: Secondary | ICD-10-CM | POA: Diagnosis not present

## 2023-12-16 DIAGNOSIS — Z981 Arthrodesis status: Secondary | ICD-10-CM | POA: Diagnosis not present

## 2023-12-16 DIAGNOSIS — G250 Essential tremor: Secondary | ICD-10-CM | POA: Diagnosis not present

## 2023-12-16 DIAGNOSIS — R490 Dysphonia: Secondary | ICD-10-CM | POA: Diagnosis not present

## 2023-12-16 DIAGNOSIS — H9193 Unspecified hearing loss, bilateral: Secondary | ICD-10-CM | POA: Diagnosis not present

## 2023-12-16 DIAGNOSIS — M25512 Pain in left shoulder: Secondary | ICD-10-CM | POA: Diagnosis not present

## 2023-12-16 DIAGNOSIS — Z951 Presence of aortocoronary bypass graft: Secondary | ICD-10-CM | POA: Diagnosis not present

## 2023-12-16 DIAGNOSIS — E785 Hyperlipidemia, unspecified: Secondary | ICD-10-CM | POA: Diagnosis not present

## 2023-12-16 DIAGNOSIS — I2510 Atherosclerotic heart disease of native coronary artery without angina pectoris: Secondary | ICD-10-CM | POA: Diagnosis not present

## 2023-12-16 DIAGNOSIS — M5416 Radiculopathy, lumbar region: Secondary | ICD-10-CM | POA: Diagnosis not present

## 2023-12-16 DIAGNOSIS — G8929 Other chronic pain: Secondary | ICD-10-CM | POA: Diagnosis not present

## 2023-12-16 DIAGNOSIS — J309 Allergic rhinitis, unspecified: Secondary | ICD-10-CM | POA: Diagnosis not present

## 2023-12-16 DIAGNOSIS — N3281 Overactive bladder: Secondary | ICD-10-CM | POA: Diagnosis not present

## 2023-12-16 DIAGNOSIS — Z7982 Long term (current) use of aspirin: Secondary | ICD-10-CM | POA: Diagnosis not present

## 2023-12-16 DIAGNOSIS — K279 Peptic ulcer, site unspecified, unspecified as acute or chronic, without hemorrhage or perforation: Secondary | ICD-10-CM | POA: Diagnosis not present

## 2023-12-18 ENCOUNTER — Telehealth: Payer: Self-pay | Admitting: Cardiovascular Disease

## 2023-12-18 MED ORDER — METOPROLOL TARTRATE 25 MG PO TABS: 12.5000 mg | ORAL_TABLET | Freq: Every morning | ORAL | 3 refills | Status: DC

## 2023-12-18 NOTE — Telephone Encounter (Signed)
*  STAT* If patient is at the pharmacy, call can be transferred to refill team.   1. Which medications need to be refilled? (please list name of each medication and dose if known)   metoprolol tartrate (LOPRESSOR) 25 MG tablet     2. Would you like to learn more about the convenience, safety, & potential cost savings by using the [facility]? no     3. Are you open to using the [facility] no   4. Which pharmacy/location (including street and city if local pharmacy) is medication to be sent to? [facility] - [place], [place] - [address]     5. Do they need a 30 day or 90 day supply? 90  Patient needs refill, please make sure instructions states "Take 0.5 tablets (12. Mg total) by mouth in the morning. Hold if pulse is less than 60"

## 2023-12-25 DIAGNOSIS — I10 Essential (primary) hypertension: Secondary | ICD-10-CM | POA: Diagnosis not present

## 2023-12-25 DIAGNOSIS — Z7982 Long term (current) use of aspirin: Secondary | ICD-10-CM | POA: Diagnosis not present

## 2023-12-25 DIAGNOSIS — R490 Dysphonia: Secondary | ICD-10-CM | POA: Diagnosis not present

## 2023-12-25 DIAGNOSIS — M25512 Pain in left shoulder: Secondary | ICD-10-CM | POA: Diagnosis not present

## 2023-12-25 DIAGNOSIS — E785 Hyperlipidemia, unspecified: Secondary | ICD-10-CM | POA: Diagnosis not present

## 2023-12-25 DIAGNOSIS — J309 Allergic rhinitis, unspecified: Secondary | ICD-10-CM | POA: Diagnosis not present

## 2023-12-25 DIAGNOSIS — I2510 Atherosclerotic heart disease of native coronary artery without angina pectoris: Secondary | ICD-10-CM | POA: Diagnosis not present

## 2023-12-25 DIAGNOSIS — G8929 Other chronic pain: Secondary | ICD-10-CM | POA: Diagnosis not present

## 2023-12-25 DIAGNOSIS — N3281 Overactive bladder: Secondary | ICD-10-CM | POA: Diagnosis not present

## 2023-12-25 DIAGNOSIS — D692 Other nonthrombocytopenic purpura: Secondary | ICD-10-CM | POA: Diagnosis not present

## 2023-12-25 DIAGNOSIS — G5733 Lesion of lateral popliteal nerve, bilateral lower limbs: Secondary | ICD-10-CM | POA: Diagnosis not present

## 2023-12-25 DIAGNOSIS — M19019 Primary osteoarthritis, unspecified shoulder: Secondary | ICD-10-CM | POA: Diagnosis not present

## 2023-12-25 DIAGNOSIS — Z951 Presence of aortocoronary bypass graft: Secondary | ICD-10-CM | POA: Diagnosis not present

## 2023-12-25 DIAGNOSIS — M5416 Radiculopathy, lumbar region: Secondary | ICD-10-CM | POA: Diagnosis not present

## 2023-12-25 DIAGNOSIS — Z981 Arthrodesis status: Secondary | ICD-10-CM | POA: Diagnosis not present

## 2023-12-25 DIAGNOSIS — G250 Essential tremor: Secondary | ICD-10-CM | POA: Diagnosis not present

## 2023-12-25 DIAGNOSIS — M21371 Foot drop, right foot: Secondary | ICD-10-CM | POA: Diagnosis not present

## 2023-12-25 DIAGNOSIS — K279 Peptic ulcer, site unspecified, unspecified as acute or chronic, without hemorrhage or perforation: Secondary | ICD-10-CM | POA: Diagnosis not present

## 2023-12-25 DIAGNOSIS — H9193 Unspecified hearing loss, bilateral: Secondary | ICD-10-CM | POA: Diagnosis not present

## 2023-12-29 ENCOUNTER — Ambulatory Visit: Payer: Medicare Other | Admitting: Physician Assistant

## 2023-12-31 DIAGNOSIS — K279 Peptic ulcer, site unspecified, unspecified as acute or chronic, without hemorrhage or perforation: Secondary | ICD-10-CM | POA: Diagnosis not present

## 2023-12-31 DIAGNOSIS — Z981 Arthrodesis status: Secondary | ICD-10-CM | POA: Diagnosis not present

## 2023-12-31 DIAGNOSIS — G8929 Other chronic pain: Secondary | ICD-10-CM | POA: Diagnosis not present

## 2023-12-31 DIAGNOSIS — M25512 Pain in left shoulder: Secondary | ICD-10-CM | POA: Diagnosis not present

## 2023-12-31 DIAGNOSIS — M5416 Radiculopathy, lumbar region: Secondary | ICD-10-CM | POA: Diagnosis not present

## 2023-12-31 DIAGNOSIS — I2510 Atherosclerotic heart disease of native coronary artery without angina pectoris: Secondary | ICD-10-CM | POA: Diagnosis not present

## 2023-12-31 DIAGNOSIS — R490 Dysphonia: Secondary | ICD-10-CM | POA: Diagnosis not present

## 2023-12-31 DIAGNOSIS — Z951 Presence of aortocoronary bypass graft: Secondary | ICD-10-CM | POA: Diagnosis not present

## 2023-12-31 DIAGNOSIS — J309 Allergic rhinitis, unspecified: Secondary | ICD-10-CM | POA: Diagnosis not present

## 2023-12-31 DIAGNOSIS — E785 Hyperlipidemia, unspecified: Secondary | ICD-10-CM | POA: Diagnosis not present

## 2023-12-31 DIAGNOSIS — H9193 Unspecified hearing loss, bilateral: Secondary | ICD-10-CM | POA: Diagnosis not present

## 2023-12-31 DIAGNOSIS — I10 Essential (primary) hypertension: Secondary | ICD-10-CM | POA: Diagnosis not present

## 2023-12-31 DIAGNOSIS — M19019 Primary osteoarthritis, unspecified shoulder: Secondary | ICD-10-CM | POA: Diagnosis not present

## 2023-12-31 DIAGNOSIS — Z7982 Long term (current) use of aspirin: Secondary | ICD-10-CM | POA: Diagnosis not present

## 2023-12-31 DIAGNOSIS — M21371 Foot drop, right foot: Secondary | ICD-10-CM | POA: Diagnosis not present

## 2023-12-31 DIAGNOSIS — N3281 Overactive bladder: Secondary | ICD-10-CM | POA: Diagnosis not present

## 2023-12-31 DIAGNOSIS — D692 Other nonthrombocytopenic purpura: Secondary | ICD-10-CM | POA: Diagnosis not present

## 2023-12-31 DIAGNOSIS — G250 Essential tremor: Secondary | ICD-10-CM | POA: Diagnosis not present

## 2023-12-31 DIAGNOSIS — G5733 Lesion of lateral popliteal nerve, bilateral lower limbs: Secondary | ICD-10-CM | POA: Diagnosis not present

## 2024-01-05 ENCOUNTER — Ambulatory Visit: Payer: Medicare Other | Admitting: Physician Assistant

## 2024-01-05 ENCOUNTER — Encounter: Payer: Self-pay | Admitting: Physician Assistant

## 2024-01-05 ENCOUNTER — Other Ambulatory Visit: Payer: Self-pay | Admitting: Internal Medicine

## 2024-01-05 VITALS — BP 133/61 | HR 80 | Resp 20 | Wt 159.0 lb

## 2024-01-05 DIAGNOSIS — F0280 Dementia in other diseases classified elsewhere without behavioral disturbance: Secondary | ICD-10-CM | POA: Diagnosis not present

## 2024-01-05 DIAGNOSIS — G250 Essential tremor: Secondary | ICD-10-CM | POA: Diagnosis not present

## 2024-01-05 DIAGNOSIS — G309 Alzheimer's disease, unspecified: Secondary | ICD-10-CM | POA: Diagnosis not present

## 2024-01-05 NOTE — Patient Instructions (Signed)
 It was a pleasure to see you today at our office.   Recommendations:    Follow up in 6 months Continue donepezil  10 mg daily and memantine  10 mg twice daily.    Continue B12 daily  Recommend visiting the website : " Dementia Success [facility]" to better understand some behaviors related to memory loss.    For psychiatric meds, mood meds: Please have your primary care physician manage these medications.  If you have any severe symptoms of a stroke, or other severe issues such as confusion,severe chills or fever, etc call 911 or go to the ER as you may need to be evaluated further   For guidance regarding [facility] and if placement were needed at the facility, contact Social Worker tel: [phone]  For assessment of decision of mental capacity and competency:  Call Dr. Laverne Potter, geriatric psychiatrist at [phone]  Counseling regarding caregiver distress, including caregiver depression, anxiety and issues regarding community resources, adult day care programs, adult living facilities, or memory care questions:  please contact your  Primary Doctor's Social Worker   Whom to call: Memory  decline, memory medications: Call our office [phone]    [email or web address]   RECOMMENDATIONS FOR ALL PATIENTS WITH MEMORY PROBLEMS: 1. Continue to exercise (Recommend 30 minutes of walking everyday, or 3 hours every week) 2. Increase social interactions - continue going to [place] and enjoy social gatherings with friends and family 3. Eat healthy, avoid fried foods and eat more fruits and vegetables 4. Maintain adequate blood pressure, blood sugar, and blood cholesterol level. Reducing the risk of stroke and cardiovascular disease also helps promoting better memory. 5. Avoid stressful situations. Live a simple life and avoid aggravations. Organize your time and prepare for the next day in anticipation. 6. Sleep well,  avoid any interruptions of sleep and avoid any distractions in the bedroom that may interfere with adequate sleep quality 7. Avoid sugar, avoid sweets as there is a strong link between excessive sugar intake, diabetes, and cognitive impairment We discussed the Mediterranean diet, which has been shown to help patients reduce the risk of progressive memory disorders and reduces cardiovascular risk. This includes eating fish, eat fruits and green leafy vegetables, nuts like almonds and hazelnuts, walnuts, and also use olive oil. Avoid fast foods and fried foods as much as possible. Avoid sweets and sugar as sugar use has been linked to worsening of memory function.  There is always a concern of gradual progression of memory problems. If this is the case, then we may need to adjust level of care according to patient needs. Support, both to the patient and caregiver, should then be put into place.      You have been referred for a neuropsychological evaluation (i.e., evaluation of memory and thinking abilities). Please bring someone with you to this appointment if possible, as it is helpful for the doctor to hear from both you and another adult who knows you well. Please bring eyeglasses and hearing aids if you wear them.    The evaluation will take approximately 3 hours and has two parts:   The first part is a clinical interview with the neuropsychologist (Dr. Kitty Perkins or Dr. Donavon Fudge). During the interview, the neuropsychologist will speak with you and the individual you brought to the appointment.    The second part of the evaluation is testing with the doctor's technician Bernabe Brew or Burdette Carolin). During the testing, the technician will ask you to remember different types of material, solve problems, and  answer some questionnaires. Your family member will not be present for this portion of the evaluation.   Please note: We must reserve several hours of the neuropsychologist's time and the psychometrician's time for  your evaluation appointment. As such, there is a No-Show fee of $100. If you are unable to attend any of your appointments, please contact our office as soon as possible to reschedule.      DRIVING: Regarding driving, in patients with progressive memory problems, driving will be impaired. We advise to have someone else do the driving if trouble finding directions or if minor accidents are reported. Independent driving assessment is available to determine safety of driving.   If you are interested in the driving assessment, you can contact the following:  [employer] in [place] [phone]  [facility] [phone]  [facility] [phone]  [facility] [phone] or [phone]   FALL PRECAUTIONS: Be cautious when walking. Scan the area for obstacles that may increase the risk of trips and falls. When getting up in the mornings, sit up at the edge of the bed for a few minutes before getting out of bed. Consider elevating the bed at the head end to avoid drop of blood pressure when getting up. Walk always in a well-lit room (use night lights in the walls). Avoid area rugs or power cords from appliances in the middle of the walkways. Use a walker or a cane if necessary and consider physical therapy for balance exercise. Get your eyesight checked regularly.  FINANCIAL OVERSIGHT: Supervision, especially oversight when making financial decisions or transactions is also recommended.  HOME SAFETY: Consider the safety of the kitchen when operating appliances like stoves, microwave oven, and blender. Consider having supervision and share cooking responsibilities until no longer able to participate in those. Accidents with firearms and other hazards in the house should be identified and addressed as well.   ABILITY TO BE LEFT ALONE: If patient is unable to contact 911 operator, consider using LifeLine, or when the need is there, arrange for  someone to stay with patients. Smoking is a fire hazard, consider supervision or cessation. Risk of wandering should be assessed by caregiver and if detected at any point, supervision and safe proof recommendations should be instituted.  MEDICATION SUPERVISION: Inability to self-administer medication needs to be constantly addressed. Implement a mechanism to ensure safe administration of the medications.      Mediterranean Diet A Mediterranean diet refers to food and lifestyle choices that are based on the traditions of countries located on the [employer]. This way of eating has been shown to help prevent certain conditions and improve outcomes for people who have chronic diseases, like kidney disease and heart disease. What are tips for following this plan? Lifestyle  Cook and eat meals together with your family, when possible. Drink enough fluid to keep your urine clear or pale yellow. Be physically active every day. This includes: Aerobic exercise like running or swimming. Leisure activities like gardening, walking, or housework. Get 7-8 hours of sleep each night. If recommended by your health care provider, drink red wine in moderation. This means 1 glass a day for nonpregnant women and 2 glasses a day for men. A glass of wine equals 5 oz (150 mL). Reading food labels  Check the serving size of packaged foods. For foods such as rice and pasta, the serving size refers to the amount of cooked product, not dry. Check the total fat in packaged foods. Avoid foods that  have saturated fat or trans fats. Check the ingredients list for added sugars, such as corn syrup. Shopping  At the grocery store, buy most of your food from the areas near the walls of the store. This includes: Fresh fruits and vegetables (produce). Grains, beans, nuts, and seeds. Some of these may be available in unpackaged forms or large amounts (in bulk). Fresh seafood. Poultry and eggs. Low-fat dairy  products. Buy whole ingredients instead of prepackaged foods. Buy fresh fruits and vegetables in-season from local farmers markets. Buy frozen fruits and vegetables in resealable bags. If you do not have access to quality fresh seafood, buy precooked frozen shrimp or canned fish, such as tuna, salmon, or sardines. Buy small amounts of raw or cooked vegetables, salads, or olives from the deli or salad bar at your store. Stock your pantry so you always have certain foods on hand, such as olive oil, canned tuna, canned tomatoes, rice, pasta, and beans. Cooking  Cook foods with extra-virgin olive oil instead of using butter or other vegetable oils. Have meat as a side dish, and have vegetables or grains as your main dish. This means having meat in small portions or adding small amounts of meat to foods like pasta or stew. Use beans or vegetables instead of meat in common dishes like chili or lasagna. Experiment with different cooking methods. Try roasting or broiling vegetables instead of steaming or sauteing them. Add frozen vegetables to soups, stews, pasta, or rice. Add nuts or seeds for added healthy fat at each meal. You can add these to yogurt, salads, or vegetable dishes. Marinate fish or vegetables using olive oil, lemon juice, garlic, and fresh herbs. Meal planning  Plan to eat 1 vegetarian meal one day each week. Try to work up to 2 vegetarian meals, if possible. Eat seafood 2 or more times a week. Have healthy snacks readily available, such as: Vegetable sticks with hummus. Greek yogurt. Fruit and nut trail mix. Eat balanced meals throughout the week. This includes: Fruit: 2-3 servings a day Vegetables: 4-5 servings a day Low-fat dairy: 2 servings a day Fish, poultry, or lean meat: 1 serving a day Beans and legumes: 2 or more servings a week Nuts and seeds: 1-2 servings a day Whole grains: 6-8 servings a day Extra-virgin olive oil: 3-4 servings a day Limit red meat and sweets  to only a few servings a month What are my food choices? Mediterranean diet Recommended Grains: Whole-grain pasta. Brown rice. Bulgar wheat. Polenta. Couscous. Whole-wheat bread. Dwyane Glad. Vegetables: Artichokes. Beets. Broccoli. Cabbage. Carrots. Eggplant. Green beans. Chard. Kale. Spinach. Onions. Leeks. Peas. Squash. Tomatoes. Peppers. Radishes. Fruits: Apples. Apricots. Avocado. Berries. Bananas. Cherries. Dates. Figs. Grapes. Lemons. Melon. Oranges. Peaches. Plums. Pomegranate. Meats and other protein foods: Beans. Almonds. Sunflower seeds. Pine nuts. Peanuts. Cod. Salmon. Scallops. Shrimp. Tuna. Tilapia. Clams. Oysters. Eggs. Dairy: Low-fat milk. Cheese. Greek yogurt. Beverages: Water. Red wine. Herbal tea. Fats and oils: Extra virgin olive oil. Avocado oil. Grape seed oil. Sweets and desserts: [place] yogurt with honey. Baked apples. Poached pears. Trail mix. Seasoning and other foods: Basil. Cilantro. Coriander. Cumin. Mint. Parsley. Sage. Rosemary. Tarragon. Garlic. Oregano. Thyme. Pepper. Balsalmic vinegar. Tahini. Hummus. Tomato sauce. Olives. Mushrooms. Limit these Grains: Prepackaged pasta or rice dishes. Prepackaged cereal with added sugar. Vegetables: Deep fried potatoes (french fries). Fruits: Fruit canned in syrup. Meats and other protein foods: Beef. Pork. Lamb. Poultry with skin. Hot dogs. Helene Loader. Dairy: Ice cream. Sour cream. Whole milk. Beverages: Juice. Sugar-sweetened soft drinks. Beer.  Liquor and spirits. Fats and oils: Butter. Canola oil. Vegetable oil. Beef fat (tallow). Lard. Sweets and desserts: Cookies. Cakes. Pies. Candy. Seasoning and other foods: Mayonnaise. Premade sauces and marinades. The items listed may not be a complete list. Talk with your dietitian about what dietary choices are right for you. Summary The Mediterranean diet includes both food and lifestyle choices. Eat a variety of fresh fruits and vegetables, beans, nuts, seeds, and whole  grains. Limit the amount of red meat and sweets that you eat. Talk with your health care provider about whether it is safe for you to drink red wine in moderation. This means 1 glass a day for nonpregnant women and 2 glasses a day for men. A glass of wine equals 5 oz (150 mL). This information is not intended to replace advice given to you by your health care provider. Make sure you discuss any questions you have with your health care provider. Document Released: 04/25/2016 Document Revised: 05/28/2016 Document Reviewed: 04/25/2016 Elsevier Interactive Patient Education  2017 [employer].

## 2024-01-05 NOTE — Progress Notes (Signed)
 Assessment/Plan:   Dementia likely due to Alzheimer's disease, late onset, with behavioral disturbance   Ronald [name] is a very pleasant 89 y.o. RH male with a history of hypertension, hyperlipidemia, essential tremors seen today in follow up for memory loss. Patient is currently on donepezil  10 mg daily and memantine  10 mg twice daily. He recently had bradycardic episodes  that resolved upon removing propanolol from the regimen without recurrence. His rate now is above 65 up to 90, for which holding/discontinuing donepezil  is not indicated. His memory has improved, able to perform his ADLS with less difficulty. He no longer drives. Mood is good. Overall, his status has improved, especially since his cataract surgery.     Follow up in 6  months. Called donepezil  10 mg daily and continue memantine  10 mg twice daily, side effects discussed Continue B12 supplementation Recommend good control of her cardiovascular risk factors Continue to control mood as per PCP Continue to monitor hearing to improve comprehension.  Essential tremor, stable He has a history of B ET hands and face.  Inderal  LA has been discontinued in view of his recent bradycardia and syncopal events.  Tremors are not worse, and he is able to participate on his ADLS to his ability.    Subjective:    This patient is accompanied in the office by his wife and daughter-in-law who supplements the history.  Previous records as well as any outside records available were reviewed prior to todays visit. Patient was last seen on 06/25/2023, with [facility] 11/30.   Any changes in memory since last visit? "I think is improved, especially since the cataract surgery"-patients says. He has some difficulty remembering recent conversations, names LTM is fair, able to recall details of the past. repeats oneself?  Endorsed. Disoriented when walking into a room?  Patient denies. Leaving objects?  May misplace things but not in unusual places    Wandering behavior?  denies   Any personality changes since last visit?  "Much better"-wife says. Any worsening depression?:  Denies.   Hallucinations or paranoia?  Endorsed.  Occasionally he sees a man at the end of the bed and then goes away.  Is not frightening to him  Seizures? denies    Any sleep changes?  He sleeps well,unless he has to get up to go to urinate 2-3 times a night.  He does report vivid dreams, denies REM behavior. "In the morning I am more unsteady, after the morning pills I keep recovering and 2 hours later I am comfortable again" Sleep apnea?   Denies.   Any hygiene concerns?  He has to be reminded at times. Independent of bathing and dressing?  Endorsed  Does the patient needs help with medications?  Patient is in charge, uses a pillbox   Who is in charge of the finances?  Patient  is in charge     Any changes in appetite?  Denies. He is doing same thing every day except for dinnertime.  "I am always full.    Patient have trouble swallowing? Denies.   Does the patient cook? No Any headaches?   denies   Chronic back pain  denies.   Ambulates with difficulty?  He has a history of right foot drop, chronic, needing a walker to ambulate.  Recent falls or head injuries?  He had a syncopal episode in January 2025, with collapse.  At that time he was found to have hemoglobin 9.6 but MCV 116, on EKG he has sinus bradycardia and  RBBB, now following cardiology Unilateral weakness, numbness or tingling? denies   Any tremors?  History of essential tremor. Tremor is worse when trying to write or sign. He also has a history of head tremor and mild redness right hand tremor. Any anosmia?  Denies   Any incontinence of urine?  Endorsed, takes Vesicare , followed by urology. Any bowel dysfunction?   Denies      Patient lives with his wife  Does the patient drive? No longer drives after he was getting lost.   Initial visit 06/25/2023  How long did patient have memory difficulties?  For  the last 2 years.  Patient has difficulty remembering new information, recent conversations and names of people, sometimes of his grandchildren. Cannot retain new information. LTM is good, able to recall details from the past repeats oneself?  Endorsed, frequently  disoriented when walking into a room? Denies  Sometimes I wake up and I don't know where he is.  Leaving objects in unusual places?   Denies.  Wandering behavior? Denies.   Any personality changes, or depression, anxiety?  Has moments of irritability and depression but not often.  Hallucinations or paranoia? "  I saw a man at the end of the bed and it went away, it happened several times at night" but this is not frightening   Seizures? Denies.    Any sleep changes?  Sleeps well. He reports recent vivid dreams, REM behavior. He may have some sleepwalking, "and can end up in the closet".   Sleep apnea? Denies.   Any hygiene concerns? He needs to be reminded at times Independent of bathing and dressing?  Denies  Who is in charge of the medications? Patient is in charge   Who is in charge of the finances?  Patient is in charge     Any changes in appetite?  Decreased. "I eat the same everyday, except for dinner time"  ."I'm always full".  Patient have trouble swallowing?  Denies.   Does the patient cook?  No   Any headaches?  Denies.   Chronic back pain?  Denies.   Ambulates with difficulty?  Patient has a history of right foot drop (chronic needs a walker to ambulate) "Takes a lot of tylenol  daily for pain".  Recent falls or head injuries? [name] out of the bed with head injury but no LOC.   He wrestled in college "I am sure I got kicked several times".  Vision changes? Denies. Stroke like symptoms?  Denies.   Any tremors?  He has a history of essential tremors, initially seen in our office, placed on Inderal  LA with good response.  His tremor is worse when trying to write or sign names he also has a history of head tremor and a history  of mild rest tremor in the right hand. Any anosmia?  Denies.   Any incontinence of urine?  Endorsed, has urine frequency he takes Vesicare , he is awaiting urology appointment. Any bowel dysfunction? Denies.      Patient lives  with his wife  History of heavy alcohol intake? Denies.   History of heavy tobacco use? Denies.   Family history of dementia?  Mother had dementia ?type .   Does patient drive? 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   atorvastatin  (LIPITOR) 40 MG tablet TAKE 1 TABLET BY MOUTH ONCE DAILY IN THE EVENING   b complex vitamins tablet Take 1 tablet by mouth daily.   Cholecalciferol (VITAMIN D ) 2000 UNITS CAPS Take 2,000 Units by mouth daily.   citalopram  (CELEXA ) 20 MG tablet Take 1 tablet (20 mg total) by mouth daily.   clotrimazole -betamethasone  (LOTRISONE ) cream Use as directed twice per day as needed   donepezil  (ARICEPT ) 10 MG tablet Take 1 tablet (10 mg total) by mouth at bedtime.   GEMTESA 75 MG TABS SMARTSIG:1 Tablet(s) By Mouth Every Evening   ketoconazole  (NIZORAL ) 2 % cream APPLY  CREAM TOPICALLY TO AFFECTED AREA ONCE DAILY   levocetirizine (XYZAL ) 5 MG tablet TAKE 1 TABLET BY MOUTH ONCE DAILY AS NEEDED FOR ALLERGIES   metoprolol  tartrate (LOPRESSOR ) 25 MG tablet Take 0.5 tablets (12.5 mg total) by mouth in the morning. Hold if pulse is less than 60.   telmisartan  (MICARDIS ) 40 MG tablet Take 1 tablet by mouth once daily   torsemide  (DEMADEX ) 10 MG tablet Take 1 tablet (10 mg total) by mouth as needed.   triamcinolone  (NASACORT ) 55 MCG/ACT  AERO nasal inhaler Place 2 sprays into the nose daily.   memantine  (NAMENDA ) 10 MG tablet Take 1 tablet (10 mg total) by mouth 2 (two) times daily.   No facility-administered encounter medications on file as of 01/05/2024.       11/06/2016    9:13 AM  MMSE - Mini Mental State Exam  Orientation to time 5  Orientation to Place 5  Registration 3  Attention/ Calculation 5  Recall 2  Language- name 2 objects 2  Language- repeat 1  Language- follow 3 step command 3  Language- read & follow direction 1  Write a sentence 1  Copy design 1  Total score 29      06/25/2023    1:00 PM  Montreal Cognitive Assessment   Visuospatial/ Executive (0/5) 2  Naming (0/3) 0  Attention: Read list of digits (0/2) 2  Attention: Read list of letters (0/1) 1  Attention: Serial 7 subtraction starting at 100 (0/3) 0  Language: Repeat phrase (0/2) 0  Language : Fluency (0/1) 1  Abstraction (0/2) 1  Delayed Recall (0/5) 0  Orientation (0/6) 4  Total 11  Adjusted Score (based on education) 11    Objective:     PHYSICAL EXAMINATION:    VITALS:   Vitals:   01/05/24 1432  BP: 133/61  Pulse: 80  Resp: 20  SpO2: 98%  Weight: 159 lb (72.1 kg)    GEN:  The patient appears stated age and is in NAD. HEENT:  Normocephalic, atraumatic.   Neurological examination:  General: NAD, well-groomed, appears stated age. Orientation: The patient is alert. Oriented to person, place and not to date Cranial nerves: There is good facial symmetry.The speech is fluent and clear. No aphasia or dysarthria. Fund of knowledge is appropriate. Recent and remote memory are impaired. Attention and concentration are reduced.  Able to name objects and repeat phrases.  Hearing is decreased to conversational tone.  Sensation: Sensation is intact to light touch throughout Motor: Strength is at least antigravity x4. DTR's 2/4 in UE/LE     Movement examination: Tone: There is normal tone in the UE/LE Abnormal movements:  Mild wrist right  hand tremor, bilateral mild intention tremor, resting head tremor.  No myoclonus.  No asterixis.   Coordination:  There is no decremation with RAM's. Normal finger to nose  Gait and Station: The patient has some difficulty arising out of a deep-seated chair without the use of the hands.  Needs a walker to ambulate due to chronic right foot drop. R ataxia noted, mild,  The patient's stride length is good.  Gait is cautious and narrow.    Thank you for allowing us  the opportunity to participate in the care of this nice patient. Please do not hesitate to contact us  for any questions or concerns.   Total time spent on today's visit was 30 minutes dedicated to this patient today, preparing to see patient, examining the patient, ordering tests and/or medications and counseling the patient, documenting clinical information in the EHR or other health record, independently interpreting results and communicating results to the patient/family, discussing treatment and goals, answering patient's questions and coordinating care.  Cc:  Roslyn Coombe, MD  Tex Filbert 01/05/2024 4:42 PM

## 2024-02-08 ENCOUNTER — Other Ambulatory Visit: Payer: Self-pay | Admitting: Internal Medicine

## 2024-02-10 ENCOUNTER — Other Ambulatory Visit: Payer: Self-pay

## 2024-03-11 ENCOUNTER — Other Ambulatory Visit: Payer: Self-pay | Admitting: Internal Medicine

## 2024-03-11 ENCOUNTER — Other Ambulatory Visit: Payer: Self-pay

## 2024-04-07 ENCOUNTER — Ambulatory Visit: Admitting: Emergency Medicine

## 2024-04-07 ENCOUNTER — Other Ambulatory Visit: Payer: Self-pay

## 2024-04-07 ENCOUNTER — Emergency Department (HOSPITAL_BASED_OUTPATIENT_CLINIC_OR_DEPARTMENT_OTHER)

## 2024-04-07 ENCOUNTER — Ambulatory Visit
Admission: EM | Admit: 2024-04-07 | Discharge: 2024-04-07 | Disposition: A | Discharge status: Home / Self Care | Attending: Family Medicine | Admitting: Family Medicine

## 2024-04-07 ENCOUNTER — Encounter (HOSPITAL_BASED_OUTPATIENT_CLINIC_OR_DEPARTMENT_OTHER): Payer: Self-pay

## 2024-04-07 ENCOUNTER — Ambulatory Visit: Payer: Self-pay

## 2024-04-07 ENCOUNTER — Observation Stay (HOSPITAL_BASED_OUTPATIENT_CLINIC_OR_DEPARTMENT_OTHER)
Admission: EM | Admit: 2024-04-07 | Discharge: 2024-04-08 | Disposition: A | Discharge status: Home / Self Care | Source: Ambulatory Visit | Attending: Internal Medicine | Admitting: Internal Medicine

## 2024-04-07 DIAGNOSIS — D649 Anemia, unspecified: Secondary | ICD-10-CM | POA: Diagnosis not present

## 2024-04-07 DIAGNOSIS — E878 Other disorders of electrolyte and fluid balance, not elsewhere classified: Secondary | ICD-10-CM | POA: Insufficient documentation

## 2024-04-07 DIAGNOSIS — R531 Weakness: Secondary | ICD-10-CM | POA: Diagnosis present

## 2024-04-07 DIAGNOSIS — S0990XA Unspecified injury of head, initial encounter: Secondary | ICD-10-CM | POA: Diagnosis not present

## 2024-04-07 DIAGNOSIS — I6782 Cerebral ischemia: Secondary | ICD-10-CM | POA: Diagnosis not present

## 2024-04-07 DIAGNOSIS — R55 Syncope and collapse: Principal | ICD-10-CM | POA: Insufficient documentation

## 2024-04-07 DIAGNOSIS — M4802 Spinal stenosis, cervical region: Secondary | ICD-10-CM | POA: Diagnosis not present

## 2024-04-07 DIAGNOSIS — F32A Depression, unspecified: Secondary | ICD-10-CM | POA: Diagnosis not present

## 2024-04-07 DIAGNOSIS — G309 Alzheimer's disease, unspecified: Secondary | ICD-10-CM | POA: Insufficient documentation

## 2024-04-07 DIAGNOSIS — R296 Repeated falls: Secondary | ICD-10-CM | POA: Insufficient documentation

## 2024-04-07 DIAGNOSIS — I10 Essential (primary) hypertension: Secondary | ICD-10-CM | POA: Insufficient documentation

## 2024-04-07 DIAGNOSIS — F419 Anxiety disorder, unspecified: Secondary | ICD-10-CM | POA: Diagnosis not present

## 2024-04-07 DIAGNOSIS — M47812 Spondylosis without myelopathy or radiculopathy, cervical region: Secondary | ICD-10-CM | POA: Diagnosis not present

## 2024-04-07 DIAGNOSIS — R2689 Other abnormalities of gait and mobility: Secondary | ICD-10-CM | POA: Insufficient documentation

## 2024-04-07 DIAGNOSIS — W19XXXA Unspecified fall, initial encounter: Secondary | ICD-10-CM

## 2024-04-07 DIAGNOSIS — Z043 Encounter for examination and observation following other accident: Secondary | ICD-10-CM | POA: Diagnosis not present

## 2024-04-07 DIAGNOSIS — G629 Polyneuropathy, unspecified: Secondary | ICD-10-CM | POA: Insufficient documentation

## 2024-04-07 DIAGNOSIS — I2510 Atherosclerotic heart disease of native coronary artery without angina pectoris: Secondary | ICD-10-CM | POA: Diagnosis not present

## 2024-04-07 DIAGNOSIS — R269 Unspecified abnormalities of gait and mobility: Secondary | ICD-10-CM | POA: Diagnosis not present

## 2024-04-07 DIAGNOSIS — Y92009 Unspecified place in unspecified non-institutional (private) residence as the place of occurrence of the external cause: Secondary | ICD-10-CM | POA: Diagnosis present

## 2024-04-07 DIAGNOSIS — R262 Difficulty in walking, not elsewhere classified: Secondary | ICD-10-CM | POA: Diagnosis not present

## 2024-04-07 DIAGNOSIS — R2681 Unsteadiness on feet: Principal | ICD-10-CM | POA: Diagnosis present

## 2024-04-07 DIAGNOSIS — S199XXA Unspecified injury of neck, initial encounter: Secondary | ICD-10-CM | POA: Diagnosis not present

## 2024-04-07 DIAGNOSIS — M5021 Other cervical disc displacement,  high cervical region: Secondary | ICD-10-CM | POA: Diagnosis not present

## 2024-04-07 LAB — CBC WITH DIFFERENTIAL/PLATELET
Abs Immature Granulocytes: 0.01 K/uL (ref 0.00–0.07)
Basophils Absolute: 0 K/uL (ref 0.0–0.1)
Basophils Relative: 1 %
Eosinophils Absolute: 0.2 K/uL (ref 0.0–0.5)
Eosinophils Relative: 4 %
HCT: 28.7 % — ABNORMAL LOW (ref 39.0–52.0)
Hemoglobin: 9.7 g/dL — ABNORMAL LOW (ref 13.0–17.0)
Immature Granulocytes: 0 %
Lymphocytes Relative: 17 %
Lymphs Abs: 0.7 K/uL (ref 0.7–4.0)
MCH: 37.7 pg — ABNORMAL HIGH (ref 26.0–34.0)
MCHC: 33.8 g/dL (ref 30.0–36.0)
MCV: 111.7 fL — ABNORMAL HIGH (ref 80.0–100.0)
Monocytes Absolute: 0.6 K/uL (ref 0.1–1.0)
Monocytes Relative: 16 %
Neutro Abs: 2.4 K/uL (ref 1.7–7.7)
Neutrophils Relative %: 62 %
Platelets: 199 K/uL (ref 150–400)
RBC: 2.57 MIL/uL — ABNORMAL LOW (ref 4.22–5.81)
RDW: 14 % (ref 11.5–15.5)
WBC: 3.9 K/uL — ABNORMAL LOW (ref 4.0–10.5)
nRBC: 0 % (ref 0.0–0.2)

## 2024-04-07 LAB — COMPREHENSIVE METABOLIC PANEL WITH GFR
ALT: 30 U/L (ref 0–44)
AST: 31 U/L (ref 15–41)
Albumin: 4 g/dL (ref 3.5–5.0)
Alkaline Phosphatase: 67 U/L (ref 38–126)
Anion gap: 8 (ref 5–15)
BUN: 38 mg/dL — ABNORMAL HIGH (ref 8–23)
CO2: 25 mmol/L (ref 22–32)
Calcium: 9.2 mg/dL (ref 8.9–10.3)
Chloride: 108 mmol/L (ref 98–111)
Creatinine, Ser: 1.12 mg/dL (ref 0.61–1.24)
GFR, Estimated: >60 mL/min (ref >60)
Glucose, Bld: 113 mg/dL — ABNORMAL HIGH (ref 70–99)
Potassium: 4.5 mmol/L (ref 3.5–5.1)
Sodium: 141 mmol/L (ref 135–145)
Total Bilirubin: 0.5 mg/dL (ref 0.0–1.2)
Total Protein: 6.2 g/dL — ABNORMAL LOW (ref 6.5–8.1)

## 2024-04-07 LAB — URINALYSIS, W/ REFLEX TO CULTURE (INFECTION SUSPECTED)
Bilirubin Urine: NEGATIVE
Glucose, UA: NEGATIVE mg/dL
Hgb urine dipstick: NEGATIVE
Ketones, ur: NEGATIVE mg/dL
Leukocytes,Ua: NEGATIVE
Nitrite: NEGATIVE
Protein, ur: NEGATIVE mg/dL
Specific Gravity, Urine: 1.015 (ref 1.005–1.030)
WBC, UA: NONE SEEN WBC/hpf (ref 0–5)
pH: 5.5 (ref 5.0–8.0)

## 2024-04-07 LAB — TROPONIN T, HIGH SENSITIVITY
Troponin T High Sensitivity: 30 ng/L — ABNORMAL HIGH (ref <19)
Troponin T High Sensitivity: 23 ng/L — ABNORMAL HIGH (ref <19)

## 2024-04-07 MED ORDER — ACETAMINOPHEN 650 MG RE SUPP: 650.0000 mg | Freq: Four times a day (QID) | RECTAL | Status: DC | PRN

## 2024-04-07 MED ORDER — MEMANTINE HCL 10 MG PO TABS
10.0000 mg | ORAL_TABLET | Freq: Two times a day (BID) | ORAL | Status: DC
  Administered 2024-04-08 (×2): 10 mg via ORAL
  Filled 2024-04-07 (×2): qty 1

## 2024-04-07 MED ORDER — IRBESARTAN 150 MG PO TABS
150.0000 mg | ORAL_TABLET | Freq: Every day | ORAL | Status: DC
  Administered 2024-04-08: 150 mg via ORAL
  Filled 2024-04-07: qty 1

## 2024-04-07 MED ORDER — SENNOSIDES-DOCUSATE SODIUM 8.6-50 MG PO TABS: 1.0000 | ORAL_TABLET | Freq: Every evening | ORAL | Status: DC | PRN

## 2024-04-07 MED ORDER — MIRABEGRON ER 25 MG PO TB24
25.0000 mg | ORAL_TABLET | Freq: Every day | ORAL | Status: DC
  Administered 2024-04-08: 25 mg via ORAL
  Filled 2024-04-07 (×2): qty 1

## 2024-04-07 MED ORDER — ONDANSETRON HCL 4 MG/2ML IJ SOLN: 4.0000 mg | Freq: Four times a day (QID) | INTRAMUSCULAR | Status: DC | PRN

## 2024-04-07 MED ORDER — ASPIRIN 81 MG PO CHEW
81.0000 mg | CHEWABLE_TABLET | Freq: Every day | ORAL | Status: DC
  Administered 2024-04-08: 81 mg via ORAL
  Filled 2024-04-07: qty 1

## 2024-04-07 MED ORDER — ATORVASTATIN CALCIUM 40 MG PO TABS
40.0000 mg | ORAL_TABLET | Freq: Every evening | ORAL | Status: DC
  Administered 2024-04-08: 40 mg via ORAL
  Filled 2024-04-07: qty 1

## 2024-04-07 MED ORDER — ACETAMINOPHEN 325 MG PO TABS: 650.0000 mg | ORAL_TABLET | Freq: Four times a day (QID) | ORAL | Status: DC | PRN

## 2024-04-07 MED ORDER — VITAMIN D 25 MCG (1000 UNIT) PO TABS
2000.0000 [IU] | ORAL_TABLET | Freq: Every day | ORAL | Status: DC
  Administered 2024-04-08: 2000 [IU] via ORAL
  Filled 2024-04-07: qty 2

## 2024-04-07 MED ORDER — CITALOPRAM HYDROBROMIDE 20 MG PO TABS
20.0000 mg | ORAL_TABLET | Freq: Every day | ORAL | Status: DC
  Administered 2024-04-08: 20 mg via ORAL
  Filled 2024-04-07: qty 1

## 2024-04-07 MED ORDER — ENOXAPARIN SODIUM 40 MG/0.4ML IJ SOSY
40.0000 mg | PREFILLED_SYRINGE | INTRAMUSCULAR | Status: DC
  Administered 2024-04-08: 40 mg via SUBCUTANEOUS
  Filled 2024-04-07: qty 0.4

## 2024-04-07 MED ORDER — DONEPEZIL HCL 10 MG PO TABS
10.0000 mg | ORAL_TABLET | Freq: Every day | ORAL | Status: DC
  Administered 2024-04-08: 10 mg via ORAL
  Filled 2024-04-07: qty 1

## 2024-04-07 MED ORDER — ONDANSETRON HCL 4 MG PO TABS: 4.0000 mg | ORAL_TABLET | Freq: Four times a day (QID) | ORAL | Status: DC | PRN

## 2024-04-07 MED ORDER — SODIUM CHLORIDE 0.9 % IV BOLUS
500.0000 mL | Freq: Once | INTRAVENOUS | Status: AC
  Administered 2024-04-07: 500 mL via INTRAVENOUS

## 2024-04-07 MED ORDER — METOPROLOL TARTRATE 25 MG PO TABS
12.5000 mg | ORAL_TABLET | Freq: Every day | ORAL | Status: DC
  Filled 2024-04-07: qty 1

## 2024-04-07 MED ORDER — B COMPLEX-C PO TABS
1.0000 | ORAL_TABLET | Freq: Every day | ORAL | Status: DC
  Administered 2024-04-08: 1 via ORAL
  Filled 2024-04-07: qty 1

## 2024-04-07 NOTE — ED Provider Notes (Signed)
 Patient seen through triage.  Patient suffered a fall this morning and is felt significant malaise all day.  Has felt confused, shaking, has not been able to walk the way he normally walks and has had to ambulate with his walker.  Given his fall, age recommended evaluation through the emergency room as he requires a higher level of testing than we can provide in the urgent care setting including rule out of an acute encephalopathy, acute intracranial injury, urosepsis.  Discussed this with patient and his caregiver.  They will present to the emergency room by personal vehicle now.   Christopher Savannah, [place] 04/07/24 1535

## 2024-04-07 NOTE — Progress Notes (Signed)
 Plan of Care Note for accepted transfer   Patient: Ronald [name] MRN: [id number]   DOA: 04/07/2024  Facility requesting transfer: [facility]   Requesting Provider: Dr. Darra   Reason for transfer: Difficulty ambulating, fall   Facility course: 89 yr old man with HTN, HLD, CAD s/p CABG in 1998, essential tremor, and memory loss who has had increased difficulty ambulating since he woke up this morning.   He normally uses a cane, has had increased difficulty ambulating for several weeks but was much worse this morning and has needed a walker today. No focal deficit identified on ED physician's exam, patient seems fine while in bed, but he feels off-balance while trying to ambulate.   Plan of care: The patient is accepted for admission to Telemetry unit, at [facility].   Author: Evalene GORMAN Sprinkles, MD 04/07/2024  Check [email or web address] for on-call coverage.  Nursing staff, Please call [facility] Admits & Consults System-Wide number on Amion as soon as patient's arrival, so appropriate admitting provider can evaluate the pt.

## 2024-04-07 NOTE — Telephone Encounter (Signed)
 FYI Only or Action Required?: Action required by provider: clinical question for provider.  Patient was last seen in primary care on 10/29/2023 by Norleen Lynwood ORN, MD.  Called Nurse Triage reporting Dizziness.  Symptoms began several days ago. Progressively getting worse  Interventions attempted: Nothing.  Symptoms are: gradually worsening.  Triage Disposition: Go to ED Now (Notify PCP)- DIL Dagoberto refusing ED. Asking if she can be seen earlier today. This RN advised that there were no earlier appts that the one patient has currently scheduled for 7/24 at 10am. DIL Dagoberto asking if she can take patient to another office. Call made to [facility], transferred to [facility].   Patient/caregiver understands and will follow disposition?: No, wishes to speak with PCP Copied from CRM #[id number]. Topic: Clinical - Red Word Triage >> Apr 07, 2024  1:28 PM [place] E wrote: [facility] that prompted transfer to Nurse Triage: Patient's daughter in law, Dagoberto, called in stating that the patient has been very dizzy the past few days, but worsening today, also very weak. Reason for Disposition  SEVERE dizziness (vertigo) (e.g., unable to walk without assistance)  Answer Assessment - Initial Assessment Questions 1. DESCRIPTION: Describe your dizziness.     Dizzy to where he has to use the walker  2. VERTIGO: Do you feel like either you or the room is spinning or tilting?      No  3. LIGHTHEADED: Do you feel lightheaded? (e.g., somewhat faint, woozy, weak upon standing)     Weak, light headed  4. SEVERITY: How bad is it?  Can you walk?     Has to use walker more, usually use a cane  5. ONSET:  When did the dizziness begin?     Has been shaky last 2 days, but 3-4 days all together  6. AGGRAVATING FACTORS: Does anything make it worse? (e.g., standing, change in head position)     Unsure  7. CAUSE: What do you think is causing the dizziness?     Unsure  8. RECURRENT SYMPTOM: Have you had dizziness  before? If Yes, ask: When was the last time? What happened that time?     Had syncopal episode before, and was taken off of beta blocker  9. OTHER SYMPTOMS: Do you have any other symptoms? (e.g., earache, headache, numbness, tinnitus, vomiting, weakness)     Not that we are aware  Protocols used: Dizziness - Vertigo-A-AH

## 2024-04-07 NOTE — ED Notes (Signed)
 Department and receiving nurse is aware the patient is in route .

## 2024-04-07 NOTE — ED Triage Notes (Signed)
 Pt presents with complaints of increased weakness and fall today. Also reports been unbalanced today. Reports lac & bruising to right arm. No head injury. No LOC

## 2024-04-07 NOTE — ED Provider Notes (Signed)
 Emergency Department Provider Note   I have reviewed the triage vital signs and the nursing notes.   HISTORY  Chief Complaint Weakness and Fall   HPI Ronald [name] is a 89 y.o. male past history of hypertension and hyperlipidemia presents to the emergency department with lightheadedness and balance issues.  The patient has had these issues ongoing for several weeks but significantly worse this morning upon waking.  He states that he typically is able to get up and ambulate to the restroom with a cane.  He tried to get out of bed this morning he immediately lost his balance falling back onto the bed.  He tried again and was able to get ambulatory but then had a fall in the bathroom.  His wife is currently in home hospice and an aide there got his walker from the garage.  He has been able to ambulate today with the walker but nearly falls if he is without that.  This is very unusual for the patient.  His daughter-in-law states he is typically very active and actually exercises daily.  He does not appreciate any unilateral weakness or numbness.  No headache.  No chest discomfort.  No changes to medications recently. Appetite is fair.    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 Respiratory: Denies  shortness of breath. Gastrointestinal: No abdominal pain.  No nausea, no vomiting.   Musculoskeletal: Negative for back pain. Skin: Negative for rash. Neurological: Negative for headaches, focal weakness or numbness.  ____________________________________________   PHYSICAL EXAM:  VITAL SIGNS: ED Triage Vitals  Encounter Vitals Group     BP 04/07/24 1608 (!) 127/90     Pulse Rate 04/07/24 1608 68     Resp 04/07/24 1608 20     Temp 04/07/24 1608 98.1 F (36.7 C)     Temp Source 04/07/24 1608 Oral     SpO2 04/07/24 1608 97 %   Constitutional: Alert and oriented. Well appearing and in no acute distress. Eyes: Conjunctivae are normal. PERRL. EOMI. Head: Atraumatic. Nose: No congestion/rhinnorhea. Mouth/Throat: Mucous membranes are moist.  Neck: No stridor.   Cardiovascular: Normal rate, regular rhythm. Good peripheral circulation. Grossly normal heart sounds.   Respiratory: Normal respiratory effort.  No retractions. Lungs CTAB. Gastrointestinal: Soft and nontender. No distention.  Musculoskeletal: No lower extremity tenderness nor edema. No gross deformities of extremities. Neurologic:  Normal speech and language. No gross focal neurologic deficits are appreciated.  5/5 strength in the bilateral upper and lower extremities.  No facial asymmetry.  Normal sensation throughout. Skin:  Skin is warm, dry and intact. No rash noted.   ____________________________________________   LABS (all labs ordered are listed, but only abnormal results are displayed)  Labs Reviewed  CBC WITH DIFFERENTIAL/PLATELET - Abnormal; Notable for the following  components:      Result Value   WBC 3.9 (*)    RBC 2.57 (*)    Hemoglobin 9.7 (*)    HCT 28.7 (*)    MCV 111.7 (*)    MCH 37.7 (*)    All other components within normal limits  COMPREHENSIVE METABOLIC PANEL WITH GFR  URINALYSIS, W/ REFLEX TO CULTURE (INFECTION SUSPECTED)  TROPONIN T, HIGH SENSITIVITY    ____________________________________________  EKG   EKG Interpretation Date/Time:  Wednesday April 07 2024 16:08:19 EDT Ventricular Rate:  70 PR Interval:  157 QRS Duration:  142 QT Interval:  418 QTC Calculation: 451 R Axis:   -63  Text Interpretation: Sinus rhythm Ventricular premature complex RBBB and LAFB Confirmed by Darra Chew [phone]) on 04/07/2024 4:27:10 PM        ____________________________________________  RADIOLOGY  No results found.  ____________________________________________   PROCEDURES  Procedure(s) performed:   Procedures   ____________________________________________   INITIAL IMPRESSION / ASSESSMENT AND PLAN / ED COURSE  Pertinent labs & imaging results that were available during my care of the patient were reviewed by me and considered in my medical decision making (see chart for details).   This patient is Presenting for Evaluation of weakness, which does require a range of treatment options, and is a complaint that involves a high risk of morbidity and mortality.  The Differential Diagnoses include AKI, dehydration, CVA, medication issue, sepsis, etc.  Critical Interventions-    Medications  sodium chloride  0.9 % bolus 500 mL (500 mLs Intravenous New Bag/Given 04/07/24 1659)    Reassessment after intervention:     I did obtain Additional Historical Information from daughter in law at bedside.   Clinical Laboratory Tests Ordered, included CBC with mild anemia to 9.7 similar to values in January. ***  Radiologic Tests Ordered, included CT head and CXR. I independently interpreted the images and agree with radiology interpretation.   Cardiac Monitor Tracing which shows NSR.    Social Determinants of Health Risk patient is a non-smoker.   Consult complete with  Medical Decision Making: Summary:  Patient presents to the emergency department with balance issues starting acutely this morning.  No focal deficits on my exam.   Patient not describing a near syncope type event.  No chest discomfort.  Plan for IV fluids, screening lab, trauma imaging and reassess.  Reevaluation with update and discussion with   ***Considered admission***  Patient's presentation is most consistent with acute presentation with potential threat to life or bodily function.   Disposition:   ____________________________________________  FINAL CLINICAL IMPRESSION(S) / ED DIAGNOSES  Final diagnoses:  None     NEW OUTPATIENT MEDICATIONS STARTED DURING THIS VISIT:  New Prescriptions   No medications on file    Note:  This document was prepared using Dragon voice recognition software and may include unintentional dictation errors.  Chew Darra, MD, [facility] Emergency Medicine

## 2024-04-07 NOTE — H&P (Signed)
 History and Physical  Ronald [name] FMW:[id number] DOB: 07-Mar-1930 DOA: 04/07/2024  PCP: Norleen Lynwood ORN, MD   Chief Complaint: Generalized weakness and fall  HPI: Ronald [name] is a 89 y.o. male with medical history significant for HTN, HLD, CAD s/p CABG, Alzheimer's dementia, chronic back pain, foot drop, syncope, anxiety and depression who presented to [facility] ED for evaluation after a fall. Per daughter-in-law, patient normally uses a cane but for the last several weeks has had difficulty ambulating. This morning, patient felt significantly weak and off balance while trying to ambulate. He had a near fall where he caught himself but then had another episode where he fell on his right arm. Patient states he did not have any balance and when he was attempted to go to the restroom he fell due to poor balance.  He did not hit his head or lose consciousness.  Patient reports feeling weak but denies any dizziness, numbness, tingling, chest pain, shortness of breath, headache or vision changes.  ED Course: Initial vitals show slightly bradycardic with HR in the 50s to 60s. Initial labs significant for WBC 3.9, Hgb 9.7, normal kidney function, troponin 30->23, UA with no signs of infection. EKG shows sinus rhythm with PVCs, RBBB and LAFB. CXR shows no active disease.  CT head and CT cervical spine with no acute abnormalities.  Pt received IV NS 500 cc bolus.  Patient was admitted to [facility] service and transferred to [facility].  Review of Systems: Please see HPI for pertinent positives and negatives. A complete 10 system review of systems are otherwise negative.  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 He has never used smokeless tobacco. He reports that he does not currently use alcohol. He reports that he does not use drugs.  No Known Allergies  Family History  Problem Relation Age of Onset   Colon cancer Father        colon     Prior to Admission medications   Medication Sig Start Date End Date Taking? Authorizing Provider  telmisartan  (MICARDIS ) 40 MG tablet Take 1 tablet by mouth once daily 02/10/24   Norleen Lynwood ORN, MD  aspirin  81 MG tablet Take 81 mg by mouth daily.    [provider]  atorvastatin  (LIPITOR) 40 MG tablet TAKE 1 TABLET BY MOUTH ONCE DAILY IN THE EVENING 03/11/24   Norleen Lynwood ORN, MD  b complex vitamins tablet Take 1 tablet by mouth daily.    [provider]  Cholecalciferol  (VITAMIN D ) 2000 UNITS CAPS Take  2,000 Units by mouth daily.    [provider]  citalopram  (CELEXA ) 20 MG tablet Take 1 tablet (20 mg total) by mouth daily. 08/26/23   Norleen Lynwood ORN, MD  clotrimazole -betamethasone  (LOTRISONE ) cream Use as directed twice per day as needed 10/08/22   Norleen Lynwood ORN, MD  donepezil  (ARICEPT ) 10 MG tablet Take 1 tablet (10 mg total) by mouth at bedtime. 10/08/23   Norleen Lynwood ORN, MD  GEMTESA 75 MG TABS SMARTSIG:1 Tablet(s) By Mouth Every Evening 07/16/23   [provider]  ketoconazole  (NIZORAL ) 2 % cream APPLY  CREAM TOPICALLY TO AFFECTED AREA ONCE DAILY 09/26/23   Norleen Lynwood ORN, MD  levocetirizine  (XYZAL ) 5 MG tablet TAKE 1 TABLET BY MOUTH ONCE DAILY AS NEEDED FOR ALLERGIES 09/26/23   Norleen Lynwood ORN, MD  memantine  (NAMENDA ) 10 MG tablet Take 1 tablet (10 mg total) by mouth 2 (two) times daily. 05/30/23   Norleen Lynwood ORN, MD  metoprolol  tartrate (LOPRESSOR ) 25 MG tablet Take 0.5 tablets (12.5 mg total) by mouth in the morning. Hold if pulse is less than 60. 12/18/23   Delford Maude BROCKS, MD  solifenacin  (VESICARE ) 5 MG tablet Take 5 mg by mouth daily.    [provider]  torsemide  (DEMADEX ) 10 MG tablet Take 1 tablet (10 mg total) by mouth as needed. 09/23/22   Delford Maude BROCKS, MD  triamcinolone  (NASACORT ) 55 MCG/ACT AERO nasal inhaler Place 2 sprays into the nose daily. 10/08/22   Norleen Lynwood ORN, MD    Physical Exam: BP (!) 130/57 (BP Location: Right Arm)   Pulse (!) 54   Temp 98.4 F (36.9 C) (Oral)   Resp 14   Wt 71.2 kg   SpO2 96%   BMI 22.52 kg/m  General: Pleasant, chronically ill elderly man laying in bed. No acute distress. HEENT: Ronald [name]. Anicteric sclera CV: RRR. No murmurs, rubs, or gallops. No LE edema Pulmonary: Lungs CTAB. Normal effort. No wheezing or rales. Abdominal: Soft, nontender, nondistended. Normal bowel sounds. Extremities: Palpable radial and DP pulses. Normal ROM. Skin: Warm and dry. No obvious rash or lesions. Neuro: A&Ox3. Moves all extremities. Strength 5/5 in all extremities. Chronic right foot drop.  Normal sensation to light touch. No focal deficit. Psych: Normal mood and affect          Labs on Admission:  Basic Metabolic Panel: Recent Labs  Lab 04/07/24 1656  NA 141  K 4.5  CL 108  CO2 25  GLUCOSE 113*  BUN 38*  CREATININE 1.12  CALCIUM  9.2   Liver Function Tests: Recent Labs  Lab 04/07/24 1656  AST 31  ALT 30  ALKPHOS 67  BILITOT 0.5  PROT 6.2*  ALBUMIN 4.0   No results for input(s): LIPASE, AMYLASE in the last 168 hours. No results for input(s): AMMONIA in the last 168 hours. CBC: Recent Labs  Lab 04/07/24 1656  WBC  3.9*  NEUTROABS 2.4  HGB 9.7*  HCT 28.7*  MCV 111.7*  PLT 199   Cardiac Enzymes: No results for input(s): CKTOTAL, CKMB, CKMBINDEX, TROPONINI in the last 168 hours. BNP (last 3 results) No results for input(s): BNP in the last 8760 hours.  ProBNP (last 3 results) No results for input(s): PROBNP in the last 8760 hours.  CBG: No results for input(s): GLUCAP in the last 168 hours.  Radiological Exams on Admission: DG Chest 2 View Result Date: 04/07/2024 CLINICAL DATA:  Fall EXAM: CHEST - 2 VIEW COMPARISON:  10/16/2023 FINDINGS: Post sternotomy changes.  No acute airspace disease or pleural effusion. Normal cardiomediastinal silhouette with aortic atherosclerosis. No pneumothorax IMPRESSION: No active cardiopulmonary disease. Electronically Signed   By: Luke Bun M.D.   On: 04/07/2024 18:05   CT Cervical Spine Wo Contrast Result Date: 04/07/2024 CLINICAL DATA:  Neck trauma fall EXAM: CT CERVICAL SPINE WITHOUT CONTRAST TECHNIQUE: Multidetector CT imaging of the cervical spine was performed without intravenous contrast. Multiplanar CT image reconstructions were also generated. RADIATION DOSE REDUCTION: This exam was performed according to the departmental dose-optimization program which includes automated exposure control, adjustment of the mA and/or kV according to patient size and/or use of iterative reconstruction technique. COMPARISON:  None Available. FINDINGS: Alignment: Trace retrolisthesis C3 on C4. Facet alignment is maintained. Skull base and vertebrae: No acute fracture. No primary bone lesion or focal pathologic process. Soft tissues and spinal canal: No prevertebral fluid or swelling. No visible canal hematoma. Disc levels: Fusion C4 through C6. Advanced disc space narrowing C3-C4, C6-C7. Multilevel facet degenerative changes. Posterior disc osteophyte and retrolisthesis at C3-C4 results in moderate severe canal stenosis. Bilateral foraminal narrowing at this level.  Moderate canal stenosis C4-C5. additional multilevel foraminal narrowing, at least moderate at C6-C7. Upper chest: Negative Other: None IMPRESSION: 1. No CT evidence for acute osseous abnormality. 2. Status post fusion C4 through C6. Advanced degenerative changes at C3-C4 and C6-C7 with moderate to severe canal stenosis at C3-C4 due to retrolisthesis and posterior disc osteophyte complex. Electronically Signed   By: Luke Bun M.D.   On: 04/07/2024 18:04   CT Head Wo Contrast Result Date: 04/07/2024 CLINICAL DATA:  Fall with head trauma EXAM: CT HEAD WITHOUT CONTRAST TECHNIQUE: Contiguous axial images were obtained from the base of the skull through the vertex without intravenous contrast. RADIATION DOSE REDUCTION: This exam was performed according to the departmental dose-optimization program which includes automated exposure control, adjustment of the mA and/or kV according to patient size and/or use of iterative reconstruction technique. COMPARISON:  CT 10/16/2023 FINDINGS: Brain: No acute territorial infarction, hemorrhage or intracranial mass. Advanced atrophy. Mild chronic small vessel ischemic changes of the white matter. Stable ventricle size. Chronic lacunar infarct in the right caudate Vascular: No hyperdense vessels.  Carotid vascular calcification Skull: Normal. Negative for fracture or focal lesion. Sinuses/Orbits: No acute finding. Other: None IMPRESSION: 1. No CT evidence for acute intracranial abnormality. 2. Advanced atrophy and mild chronic small vessel ischemic changes of the white matter. Chronic lacunar infarct in the right caudate. Electronically Signed   By: Luke Bun M.D.   On: 04/07/2024 17:58   Assessment/Plan Ronald [name] is a 89 y.o. male with medical history significant for HTN, HLD, CAD s/p CABG, Alzheimer's dementia, chronic back pain, foot drop, syncope, anxiety and depression who presented to [facility] ED for evaluation after a fall   # Fall #  Disequilibrium # Hx of syncope and collapse - Presented after a fall in the setting of loss of balance and lower extremity weakness - No identified cause of his symptoms at the moment and no focal neurodeficits on exam - Seen by cardiology a few months ago with cardiac etiology ruled out - Patient euvolemic on exam, does not complain of any lightheadedness - Check MRI brain to rule out neurological cause such as infarct of posterior circulation or pons - PT/OT eval and treat - Fall precautions  # HTN - BP stable with SBP in the 120s to 140s - Continue home Lopressor  and telmisartan  (irbesartan  as substitute)  # CAD  s/p CABG # HLD - Continue ASA and atorvastatin   # Macrocytic anemia - Hgb stable at 9.7, baseline of 9-10 - Continue B complex vitamins  # Alzheimer's dementia - Has memory deficits but alert and oriented x 3 at baseline - Continue Aricept  and memantine   # Anxiety and depression - Continue Celexa   # Generalized weakness - No evidence of acute infection or metabolic dysfunction - PT/OT eval and treat  DVT prophylaxis: Lovenox      Code Status: Limited: Do not attempt resuscitation (DNR) -DNR-LIMITED -Do Not Intubate/DNI   Consults called: None  Family Communication: Discussed admission with daughter-in-law over the phone  Severity of Illness: The appropriate patient status for this patient is OBSERVATION. Observation status is judged to be reasonable and necessary in order to provide the required intensity of service to ensure the patient's safety. The patient's presenting symptoms, physical exam findings, and initial radiographic and laboratory data in the context of their medical condition is felt to place them at decreased risk for further clinical deterioration. Furthermore, it is anticipated that the patient will be medically stable for discharge from the hospital within 2 midnights of admission.   Level of care: Telemetry   This record has been created  using Conservation officer, historic buildings. Errors have been sought and corrected, but may not always be located. Such creation errors do not reflect on the standard of care.   Ronald Claretta HERO, MD 04/08/2024, 2:02 AM [facility] Pager: [phone] Isaiah 41:10   If 7PM-7AM, please contact night-coverage [email or web address] Password [email or web address]

## 2024-04-07 NOTE — ED Notes (Signed)
 Patient is being discharged from the Urgent Care and sent to the Emergency Department via POV . Per [place] PA C, patient is in need of higher level of care due to fall/evaluation. Patient is aware and verbalizes understanding of plan of care.  Vitals:   04/07/24 1523  BP: 131/65  Pulse: 93  Resp: 18  Temp: 98 F (36.7 C)  SpO2: 93%

## 2024-04-07 NOTE — ED Notes (Signed)
Patient transported to CT and back without event.

## 2024-04-07 NOTE — ED Triage Notes (Signed)
 Pt reports he fell today and hit the right arm, he feels confused, feels shaking and was not able to walk without his walker today, states he use a cane everyday not a walker. Per daughter in law is concerning pt is complaining he do not feel well.

## 2024-04-08 ENCOUNTER — Observation Stay (HOSPITAL_COMMUNITY)

## 2024-04-08 ENCOUNTER — Ambulatory Visit: Admitting: Internal Medicine

## 2024-04-08 DIAGNOSIS — G319 Degenerative disease of nervous system, unspecified: Secondary | ICD-10-CM | POA: Diagnosis not present

## 2024-04-08 DIAGNOSIS — Z8673 Personal history of transient ischemic attack (TIA), and cerebral infarction without residual deficits: Secondary | ICD-10-CM | POA: Diagnosis not present

## 2024-04-08 DIAGNOSIS — Y92009 Unspecified place in unspecified non-institutional (private) residence as the place of occurrence of the external cause: Secondary | ICD-10-CM | POA: Diagnosis present

## 2024-04-08 DIAGNOSIS — R42 Dizziness and giddiness: Secondary | ICD-10-CM | POA: Diagnosis not present

## 2024-04-08 DIAGNOSIS — G609 Hereditary and idiopathic neuropathy, unspecified: Secondary | ICD-10-CM

## 2024-04-08 DIAGNOSIS — W19XXXA Unspecified fall, initial encounter: Secondary | ICD-10-CM | POA: Diagnosis not present

## 2024-04-08 DIAGNOSIS — R55 Syncope and collapse: Secondary | ICD-10-CM

## 2024-04-08 DIAGNOSIS — R2681 Unsteadiness on feet: Principal | ICD-10-CM

## 2024-04-08 LAB — BASIC METABOLIC PANEL WITH GFR
Anion gap: 7 (ref 5–15)
BUN: 30 mg/dL — ABNORMAL HIGH (ref 8–23)
CO2: 26 mmol/L (ref 22–32)
Calcium: 9 mg/dL (ref 8.9–10.3)
Chloride: 109 mmol/L (ref 98–111)
Creatinine, Ser: 1.11 mg/dL (ref 0.61–1.24)
GFR, Estimated: >60 mL/min (ref >60)
Glucose, Bld: 108 mg/dL — ABNORMAL HIGH (ref 70–99)
Potassium: 4.1 mmol/L (ref 3.5–5.1)
Sodium: 142 mmol/L (ref 135–145)

## 2024-04-08 LAB — PHOSPHORUS: Phosphorus: 3.6 mg/dL (ref 2.5–4.6)

## 2024-04-08 LAB — CBC
HCT: 28.7 % — ABNORMAL LOW (ref 39.0–52.0)
Hemoglobin: 9.4 g/dL — ABNORMAL LOW (ref 13.0–17.0)
MCH: 38.1 pg — ABNORMAL HIGH (ref 26.0–34.0)
MCHC: 32.8 g/dL (ref 30.0–36.0)
MCV: 116.2 fL — ABNORMAL HIGH (ref 80.0–100.0)
Platelets: 181 K/uL (ref 150–400)
RBC: 2.47 MIL/uL — ABNORMAL LOW (ref 4.22–5.81)
RDW: 14.2 % (ref 11.5–15.5)
WBC: 3.8 K/uL — ABNORMAL LOW (ref 4.0–10.5)
nRBC: 0 % (ref 0.0–0.2)

## 2024-04-08 LAB — MAGNESIUM: Magnesium: 2.2 mg/dL (ref 1.7–2.4)

## 2024-04-08 MED ORDER — ORAL CARE MOUTH RINSE: 15.0000 mL | OROMUCOSAL | Status: DC | PRN

## 2024-04-08 NOTE — Evaluation (Addendum)
 Occupational Therapy Evaluation Patient Details Name: Ronald [name] MRN: [id number] DOB: March 01, 1930 Today's Date: 04/08/2024   History of Present Illness   Ronald [name] is a 89 y.o. male presented to [facility] ED for evaluation after a fall. Dx with fall, dysequilibrium, syncope and collapse and transferred to [facility] for observation. PMH: HTN, HLD, CAD s/p CABG, Alzheimer's dementia, chronic back pain, foot drop, syncope, anxiety and depression     Clinical Impressions PTA, patient lives at home with wife who has become bedridden with personal caregivers and home therapy with patient caring for her with intermittent support from DIL. Patient with R foot drop and SPC/rollator use but recent falls managing BADL's and mobility in home at mod I but does not drive and has family assist for driving. Question pt's accuracy as historian due to pmh Alzheimer's/Dementia dx. Currently, patient presents with deficits outlined below (see OT Problem List for details) most significantly R foot drop, B hand tremors, balance, coordination and activity tolerance deficits limiting BADL's and functional mobility. Patient requires continued Acute care hospital level OT services to progress safety and functional performance and allow for discharge. Anticipate home with caregiver support and assist.       If plan is discharge home, recommend the following:   A little help with walking and/or transfers;A little help with bathing/dressing/bathroom;Assistance with cooking/housework;Assist for transportation;Help with stairs or ramp for entrance     Functional Status Assessment   Patient has had a recent decline in their functional status and demonstrates the ability to make significant improvements in function in a reasonable and predictable amount of time.     Equipment Recommendations   None recommended by OT      Precautions/Restrictions   Precautions Precautions: Fall     Mobility Bed  Mobility Overal bed mobility: Needs Assistance Bed Mobility: Supine to Sit, Sit to Supine     Supine to sit: Supervision, HOB elevated Sit to supine: Supervision, HOB elevated   General bed mobility comments: cues for slow position changes    Transfers Overall transfer level: Needs assistance Equipment used: Rolling walker (2 wheels) Transfers: Sit to/from Stand, Bed to chair/wheelchair/BSC Sit to Stand: Contact guard assist     Step pivot transfers: Contact guard assist     General transfer comment: min cues for hand placement      Balance Overall balance assessment: History of Falls                                         ADL either performed or assessed with clinical judgement   ADL Overall ADL's : Needs assistance/impaired Eating/Feeding: Independent   Grooming: Wash/dry hands;Wash/dry face;Oral care;Sitting;Modified independent   Upper Body Bathing: Modified independent;Sitting   Lower Body Bathing: Supervison/ safety;Sit to/from stand   Upper Body Dressing : Modified independent;Sitting   Lower Body Dressing: Supervision/safety;Sit to/from Careers adviser Details (indicate cue type and reason): amb with RW to and from bathroom Toileting- Clothing Manipulation and Hygiene: Modified independent;Sitting/lateral lean   Tub/ Shower Transfer: Lobbyist Details (indicate cue type and reason): has tub lip to navigated but does also have TTB that OT rec use of Functional mobility during ADLs: Contact guard assist;Rolling walker (2 wheels) General ADL Comments: min cues for pacing and to allow increased time for position changes  Vision Baseline Vision/History: 0 No visual deficits Ability to See in Adequate Light: 0 Adequate Patient Visual Report: No change from baseline Vision Assessment?: No apparent visual deficits Additional Comments: gross tracking  and VOR completed with no over nystagmus demonstrated, will need full assessment and w/u if vestibular issues suspected            Pertinent Vitals/Pain Pain Assessment Pain Assessment: No/denies pain     Extremity/Trunk Assessment Upper Extremity Assessment Upper Extremity Assessment: Right hand dominant;RUE deficits/detail;LUE deficits/detail RUE Deficits / Details: baseline intention tremor RUE Coordination: decreased fine motor LUE Deficits / Details: baseline intention tremor LUE Coordination: decreased fine motor   Lower Extremity Assessment Lower Extremity Assessment: RLE deficits/detail;Generalized weakness RLE Deficits / Details: hx of right foot drop per chart, pt reports he did have a little active DF prior to this admission but none present today (gait pattern also appears chronic with compensation for no active DFand pt reporting he does not use AFO)   Cervical / Trunk Assessment Cervical / Trunk Assessment: Normal   Communication Communication Communication: Impaired Factors Affecting Communication: Hearing impaired   Cognition Arousal: Alert Behavior During Therapy: WFL for tasks assessed/performed Cognition: History of cognitive impairments             OT - Cognition Comments: STM deficits,  decreased insight into deficits as foot drop on R has been persistent, chart review lists Dementia/Alzheimer's dx in history                 Following commands: Intact       Cueing  General Comments   Cueing Techniques: Verbal cues  Pt reports syncopal episodes earlier this year, seen by cardiologist and without known cause per his report.  Pt now reports falls are due to feeling off balance.  He denies spinning.  He states earlier this admission when up with RN, he felt like the world was moving up/down however occurred with his return to bed and did not last very long per his report.           Home Living Family/patient expects to be discharged to::  Private residence Living Arrangements: Spouse/significant other Available Help at Discharge: Family;Available PRN/intermittently;Personal care attendant Type of Home: House Home Access: Level entry     Home Layout: One level     Bathroom Shower/Tub: Chief Strategy Officer: Standard Bathroom Accessibility: Yes How Accessible: Accessible via walker Home Equipment: Rollator (4 wheels);Cane - single point;Shower seat;Hand held shower head;Adaptive equipment Adaptive Equipment: Reacher        Prior Functioning/Environment Prior Level of Function : Independent/Modified Independent;History of Falls (last six months)             Mobility Comments: SPC and new rollator use mod I ADLs Comments: mod I and cares for wife who has been disabled but has caregivewrs and therapy    OT Problem List: Decreased activity tolerance;Impaired balance (sitting and/or standing);Decreased coordination   OT Treatment/Interventions: Self-care/ADL training;Therapeutic exercise;Neuromuscular education;Energy conservation;DME and/or AE instruction;Therapeutic activities;Patient/family education;Balance training      OT Goals(Current goals can be found in the care plan section)   Acute Rehab OT Goals Patient Stated Goal: to stop falling OT Goal Formulation: With patient Time For Goal Achievement: 04/22/24 Potential to Achieve Goals: Good   OT Frequency:  Min 2X/week       AM-PAC OT 6 Clicks Daily Activity     Outcome Measure Help from another person eating meals?: None Help from another person  taking care of personal grooming?: None Help from another person toileting, which includes using toliet, bedpan, or urinal?: A Little Help from another person bathing (including washing, rinsing, drying)?: A Little Help from another person to put on and taking off regular upper body clothing?: None Help from another person to put on and taking off regular lower body clothing?: A Little 6  Click Score: 21   End of Session Equipment Utilized During Treatment: Rolling walker (2 wheels);Gait belt Nurse Communication: Mobility status  Activity Tolerance: Patient tolerated treatment well Patient left: in bed;with bed alarm set;with call bell/phone within reach;Other (comment) (hand off to PT)  OT Visit Diagnosis: Unsteadiness on feet (R26.81);Other abnormalities of gait and mobility (R26.89);History of falling (Z91.81)                Time: 8584-8567 OT Time Calculation (min): 17 min Charges:  OT General Charges $OT Visit: 1 Visit OT Evaluation $OT Eval Low Complexity: 1 Low  Peterson Mathey OT/L Acute Rehabilitation Department  [phone]  04/08/2024, 5:07 PM

## 2024-04-08 NOTE — Plan of Care (Signed)
  Problem: Education: Goal: Knowledge of General Education information will improve Description: Including pain rating scale, medication(s)/side effects and non-pharmacologic comfort measures Outcome: Progressing   Problem: Clinical Measurements: Goal: Ability to maintain clinical measurements within normal limits will improve Outcome: Adequate for Discharge   Problem: Safety: Goal: Ability to remain free from injury will improve Outcome: Adequate for Discharge

## 2024-04-08 NOTE — Evaluation (Signed)
 Physical Therapy Evaluation Patient Details Name: Ronald [name] MRN: [id number] DOB: 01/10/1930 Today's Date: 04/08/2024  History of Present Illness  Ronald [name] is a 89 y.o. male presented to [facility] ED for evaluation after a fall. Dx with fall, dysequilibrium, syncope and collapse and transferred to [facility] for observation. PMH: HTN, HLD, CAD s/p CABG, Alzheimer's dementia, chronic back pain, foot drop, syncope, anxiety and depression  Clinical Impression  Pt admitted with above diagnosis.  Pt currently with functional limitations due to the deficits listed below (see PT Problem List). Pt will benefit from acute skilled PT to increase their independence and safety with mobility to allow discharge.  Pt reports waiting on his MRI and then he was anticipating d/c home today (RN notified).  Pt agreeable to work with PT and ambulated in hallway.  Pt able to ambulate 120 ft with RW with observed R foot drop (however chronic per history).  Pt reports falls lately due to feeling off balance however also with hx of syncopal episodes earlier this year and seen by cardiology which pt reports no significant findings.  Pt was did not describe dizziness or symptoms until returning to sitting after ambulating and stated he felt a little dizzy but denied faint, spinning, and world moving.  Dizziness was brief, no nystagmus observed, and vitals appeared stable (see mobility section below).   Since pt anticipated d/c later today, left OPPT vestibular referral form for pt as a follow up option.  He believes Ronald [name] would be able to drive him to OPPT.  Pt going for MRI upon returning to his room with this form.          If plan is discharge home, recommend the following:     Can travel by private vehicle        Equipment Recommendations None recommended by PT  Recommendations for Other Services       Functional Status Assessment Patient has had a recent decline in their functional status and  demonstrates the ability to make significant improvements in function in a reasonable and predictable amount of time.     Precautions / Restrictions Precautions Precautions: Fall Precaution/Restrictions Comments: hx R foot drop      Mobility  Bed Mobility Overal bed mobility: Needs Assistance Bed Mobility: Supine to Sit, Sit to Supine     Supine to sit: Supervision, HOB elevated Sit to supine: Supervision, HOB elevated        Transfers Overall transfer level: Needs assistance Equipment used: Rolling walker (2 wheels) Transfers: Sit to/from Stand Sit to Stand: Contact guard assist           General transfer comment: CGA for safety    Ambulation/Gait Ambulation/Gait assistance: Contact guard assist Gait Distance (Feet): 120 Feet Assistive device: Rolling walker (2 wheels) Gait Pattern/deviations: Steppage, Decreased dorsiflexion - right, Step-through pattern       General Gait Details: R steppage gait due to decr R DF, appears chronic but pt also reports no use of AFO, appears stable with use of RW, denies dizziness until returning to room; BP 136/74 mmHg, HR 55 bpm upon pt returning to bed  Stairs            Wheelchair Mobility     Tilt Bed    Modified Rankin (Stroke Patients Only)       Balance Overall balance assessment: History of Falls  Pertinent Vitals/Pain Pain Assessment Pain Assessment: No/denies pain    Home Living Family/patient expects to be discharged to:: Private residence Living Arrangements: Spouse/significant other Available Help at Discharge: Family;Available PRN/intermittently;Personal care attendant Type of Home: House Home Access: Level entry       Home Layout: One level Home Equipment: Rollator (4 wheels);Cane - single point;Shower seat;Hand held shower head;Adaptive equipment      Prior Function Prior Level of Function : Independent/Modified  Independent;History of Falls (last six months)             Mobility Comments: SPC and new rollator use mod I ADLs Comments: mod I and cares for wife who has been disabled but has caregivewrs and therapy     Extremity/Trunk Assessment        Lower Extremity Assessment Lower Extremity Assessment: RLE deficits/detail;Generalized weakness RLE Deficits / Details: hx of right foot drop per chart, pt reports he did have a little active DF prior to this admission but none present today (gait pattern also appears chronic with compensation for no active DFand pt reporting he does not use AFO)       Communication   Communication Communication: Impaired Factors Affecting Communication: Hearing impaired    Cognition Arousal: Alert Behavior During Therapy: WFL for tasks assessed/performed   PT - Cognitive impairments: No apparent impairments                         Following commands: Intact       Cueing Cueing Techniques: Verbal cues     General Comments General comments (skin integrity, edema, etc.): Pt reports syncopal episodes earlier this year, seen by cardiologist and without known cause per his report.  Pt now reports falls are due to feeling off balance.  He denies spinning.  He states earlier this admission when up with RN, he felt like the world was moving up/down however occurred with his return to bed and did not last very long per his report.    Exercises     Assessment/Plan    PT Assessment Patient needs continued PT services  PT Problem List Decreased mobility;Decreased balance       PT Treatment Interventions DME instruction;Gait training;Balance training;Therapeutic activities;Therapeutic exercise;Functional mobility training;Stair training;Patient/family education    PT Goals (Current goals can be found in the Care Plan section)  Acute Rehab PT Goals PT Goal Formulation: With patient Time For Goal Achievement: 04/22/24 Potential to Achieve  Goals: Good    Frequency Min 3X/week     Co-evaluation               AM-PAC PT 6 Clicks Mobility  Outcome Measure Help needed turning from your back to your side while in a flat bed without using bedrails?: None Help needed moving from lying on your back to sitting on the side of a flat bed without using bedrails?: None Help needed moving to and from a bed to a chair (including a wheelchair)?: A Little Help needed standing up from a chair using your arms (e.g., wheelchair or bedside chair)?: A Little Help needed to walk in hospital room?: A Little Help needed climbing 3-5 steps with a railing? : A Little 6 Click Score: 20    End of Session Equipment Utilized During Treatment: Gait belt Activity Tolerance: Patient tolerated treatment well Patient left: in bed;with call bell/phone within reach;with bed alarm set Nurse Communication: Mobility status PT Visit Diagnosis: Other abnormalities of gait and mobility (R26.89)  Time: 8564-8546 PT Time Calculation (min) (ACUTE ONLY): 18 min   Charges:   PT Evaluation $PT Eval Low Complexity: 1 Low   PT General Charges $$ ACUTE PT VISIT: 1 Visit        Tari PT, DPT Physical Therapist Acute Rehabilitation Services Office: [phone]   Tari CROME Payson 04/08/2024, 4:27 PM

## 2024-04-08 NOTE — Discharge Instructions (Signed)
 Please take all prescribed medications exactly as instructed.  Your home regimen of as needed Torsemide  has been discontinued. Please consume a regular diet Please increase your physical activity as tolerated.  Please use an assistive device such as a walker whenever ambulating. Please maintain all outpatient follow-up appointments including follow-up with your primary care provider. Please return to the emergency department if you develop recurrent falls, loss of consciousness,  fevers in excess of 100.4 F, weakness or inability to tolerate oral intake.

## 2024-04-08 NOTE — Progress Notes (Signed)
   04/08/24 1618  TOC Brief Assessment  Insurance and Status Reviewed  Patient has primary care physician Yes Vilinda, Lynwood ORN, MD)  Home environment has been reviewed From Home  Prior level of function: Independent  Prior/Current Home Services No current home services  Social Drivers of Health Review SDOH reviewed no interventions necessary  Readmission risk has been reviewed Yes  Transition of care needs no transition of care needs at this time

## 2024-04-08 NOTE — Care Management Obs Status (Signed)
 MEDICARE OBSERVATION STATUS NOTIFICATION   Patient Details  Name: Ronald [name] MRN: [id number] Date of Birth: 1930/01/26   Medicare Observation Status Notification Given:  Yes    Toy LITTIE Agar, RN 04/08/2024, 4:11 PM

## 2024-04-08 NOTE — Hospital Course (Addendum)
 89 y.o. male with medical history significant for HTN, HLD, CAD s/p CABG, Alzheimer's dementia, chronic back pain, foot drop, syncope, anxiety and depression who presented to [facility] ED for evaluation after a fall.   Upon evaluation in the ED patient was found to be bradycardic.  Initial troponin was found to be slightly elevated but down trended and patient remained chest pain-free throughout the evaluation.  A NS bolus was administered and the hospitalist group was then called to assess the patient for admission to the hospital.  PT evaluation was performed and found the patient would benefit from outpatient physical therapy services.  Orthostatic vital signs were obtained and found to be unremarkable.  Upon recent outpatient evaluation by cardiology and echocardiogram was obtained 10/2023 and found to be unremarkable.  Patient was monitored on telemetry without any evidence of arrhythmia.  MRI was additionally obtained to evaluate for any evidence of posterior circulation cerebrovascular disease and was additionally found to be unremarkable.  Review of patient's home medication for contributing causes was performed and patient's home regimen of as needed torsemide  was discontinued.  Patient was evaluated by physical therapy during the hospitalization and it was felt the patient would benefit from an outpatient physical therapy regimen.  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  This combined with the patient's advanced age and progressive debility is likely resulting in the patient's frequent falls.  Patient is manage ducted to  use a walker at all times going forward.    Patient was discharged home in improved and stable condition on 04/08/2024.SABRA

## 2024-04-08 NOTE — Progress Notes (Incomplete)
 PROGRESS NOTE   Ronald [name]  FMW:[id number] DOB: 1930/05/27 DOA: 04/07/2024 PCP: Norleen Lynwood ORN, MD   Date of Service: the patient was seen and examined on 04/08/2024  Brief Narrative:  89 y.o. male with medical history significant for HTN, HLD, CAD s/p CABG, Alzheimer's dementia, chronic back pain, foot drop, syncope, anxiety and depression who presented to [facility] ED for evaluation after a fall.   Upon evaluation in the ED patient was found to be bradycardic.  Initial troponin was found to be slightly elevated but down trended and patient remained chest pain-free throughout the evaluation.  A NS bolus was administered and the hospitalist group was then called to assess the patient for admission to the hospital.  PT evaluation was performed and found the patient would benefit from outpatient physical therapy services.  Orthostatic vital signs were obtained and found to be unremarkable.  Upon recent outpatient evaluation by cardiology and echocardiogram was obtained 10/2023 and found to be unremarkable.  Patient was monitored on telemetry without any evidence of arrhythmia.  MRI was additionally obtained to evaluate for any evidence of posterior circulation cerebrovascular disease.    Assessment & Plan Ambulatory dysfunction  Gait instability  Fall  Near syncope      Subjective:  ***  Physical Exam:  Vitals:   04/08/24 1303 04/08/24 1635 04/08/24 1637 04/08/24 1639  BP: 122/65 (!) 118/57 130/60 138/73  Pulse: 79 (!) 58 63 67  Resp: 16     Temp: 98.7 F (37.1 C)     TempSrc: Oral     SpO2: 93%     Weight:        *** Constitutional: Awake alert and oriented x3, no associated distress.   Skin: no rashes, no lesions, good skin turgor noted. Eyes: Pupils are equally reactive to light.  No evidence of scleral icterus or conjunctival pallor.  ENMT: Moist mucous membranes noted.  Posterior pharynx clear of any exudate or lesions.   Respiratory: clear to  auscultation bilaterally, no wheezing, no crackles. Normal respiratory effort. No accessory muscle use.  Cardiovascular: Regular rate and rhythm, no murmurs / rubs / gallops. No extremity edema. 2+ pedal pulses. No carotid bruits.  Abdomen: Abdomen is soft and nontender.  No evidence of intra-abdominal masses.  Positive bowel sounds noted in all quadrants.   Musculoskeletal: No joint deformity upper and lower extremities. Good ROM, no contractures. Normal muscle tone.    Data Reviewed:  I have personally reviewed and interpreted labs, imaging.  Significant findings are ***  CBC: Recent Labs  Lab 04/07/24 1656 04/08/24 0640  WBC 3.9* 3.8*  NEUTROABS 2.4  --   HGB 9.7* 9.4*  HCT 28.7* 28.7*  MCV 111.7* 116.2*  PLT 199 181   Basic Metabolic Panel: Recent Labs  Lab 04/07/24 1656 04/08/24 0640  NA 141 142  K 4.5 4.1  CL 108 109  CO2 25 26  GLUCOSE 113* 108*  BUN 38* 30*  CREATININE 1.12 1.11  CALCIUM  9.2 9.0  MG  --  2.2  PHOS  --  3.6   GFR: Estimated Creatinine Clearance: 42.8 mL/min (by C-G formula based on SCr of 1.11 mg/dL). Liver Function Tests: Recent Labs  Lab 04/07/24 1656  AST 31  ALT 30  ALKPHOS 67  BILITOT 0.5  PROT 6.2*  ALBUMIN 4.0    Coagulation Profile: No results for input(s): INR, PROTIME in the last 168 hours.   EKG/Telemetry: Personally reviewed.  Rhythm is *** with heart rate  of ***.  No dynamic ST segment changes appreciated.   Code Status:  {Palliative Code status:23503}.  Code status decision has been confirmed with: *** Family Communication: ***    Severity of Illness:  {Observation/Inpatient:21159}  Time spent:  *** minutes  Author:  Zachary JINNY Ba MD  04/08/2024 6:02 PM

## 2024-04-09 ENCOUNTER — Telehealth: Payer: Self-pay | Admitting: Internal Medicine

## 2024-04-09 NOTE — Telephone Encounter (Signed)
 Copied from CRM [id number]. Topic: Referral - Question >> Apr 09, 2024 10:57 AM Maisie BROCKS wrote: Reason for CRM: Pt's daughter in law, Rudell, called to request if Dr. Norleen could go ahead and send an order for home PT w/ [facility] since he scheduled a hospital f/u on 7/31. Pt was just recently discharged.  Please advise at [phone] darold).

## 2024-04-09 NOTE — Telephone Encounter (Signed)
 This is unlikely to be able as pt has not been seen by me within the last 90 days  Sorry for the rules they have

## 2024-04-10 DIAGNOSIS — G629 Polyneuropathy, unspecified: Secondary | ICD-10-CM

## 2024-04-10 NOTE — Discharge Summary (Signed)
 Physician Discharge Summary   Patient: Ronald [name] MRN: [id number] DOB: March 06, 1930  Admit date:     04/07/2024  Discharge date: 04/08/2024  Discharge Physician: Ronald [name]   PCP: Ronald [name]   Recommendations at discharge:   Please take all prescribed medications exactly as instructed.  Your home regimen of as needed Torsemide  has been discontinued. Please consume a regular diet Please increase your physical activity as tolerated.  Please use an assistive device such as a walker whenever ambulating. Please maintain all outpatient follow-up appointments including follow-up with your primary care provider. Please return to the emergency department if you develop recurrent falls, loss of consciousness,  fevers in excess of 100.4 F, weakness or inability to tolerate oral intake.  Discharge Diagnoses: Principal Problem:   Fall at home, initial encounter Active Problems:   Gait instability   Peripheral neuropathy   Ambulatory dysfunction   Near syncope  Resolved Problems:   * No resolved hospital problems. *   Hospital Course: 89 y.o. male with medical history significant for HTN, HLD, CAD s/p CABG, Alzheimer's dementia, chronic back pain, foot drop, syncope, anxiety and depression who presented to [facility] ED for evaluation after a fall.   Upon evaluation in the ED patient was found to be bradycardic.  Initial troponin was found to be slightly elevated but down trended and patient remained chest pain-free throughout the evaluation.  A NS bolus was administered and the hospitalist group was then called to assess the patient for admission to the hospital.  PT evaluation was performed and found the patient would benefit from outpatient physical therapy services.  Orthostatic vital signs were obtained and found to be unremarkable.  Upon recent outpatient evaluation by cardiology and echocardiogram was obtained 10/2023 and found to be unremarkable.  Patient was  monitored on telemetry without any evidence of arrhythmia.  MRI was additionally obtained to evaluate for any evidence of posterior circulation cerebrovascular disease and was additionally found to be unremarkable.  Review of patient's home medication for contributing causes was performed and patient's home regimen of as needed torsemide  was discontinued.  Patient was evaluated by physical therapy during the hospitalization and it was felt the patient would benefit from an outpatient physical therapy regimen.  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  This combined with the patient's advanced age and progressive debility is likely resulting in the patient's frequent falls.  Patient is manage ducted to use a walker at all times going forward.    Patient was discharged home in improved and stable condition on 04/08/2024.Ronald [name]      Pain control - [place]  Controlled Substance Reporting System database was reviewed. and patient was instructed, not to drive, operate heavy machinery, perform activities at heights, swimming or participation in water activities or provide baby-sitting services while on Pain, Sleep and Anxiety Medications; until their outpatient Physician has advised to do so again. Also recommended to not to take more than prescribed Pain, Sleep and Anxiety Medications.   Consultants: none Procedures performed: none good job Disposition: Home Diet recommendation:  Discharge Diet Orders (From admission, onward)     Start  Ordered   04/08/24 0000  Diet - low sodium heart healthy        04/08/24 1944            Cardiac diet  DISCHARGE MEDICATION: Allergies as of 04/08/2024   No Known Allergies      Medication List     STOP taking these medications    ketoconazole  2 % cream Commonly known as: NIZORAL    solifenacin  5 MG tablet Commonly known as: VESICARE    torsemide  10 MG tablet Commonly known as: DEMADEX        TAKE these medications    aspirin  81 MG tablet Take 81 mg by mouth daily.   atorvastatin  40 MG tablet Commonly known as: LIPITOR TAKE 1 TABLET BY MOUTH ONCE DAILY IN THE EVENING   b complex vitamins tablet Take 1 tablet by mouth daily.   citalopram  20 MG tablet Commonly known as: CELEXA  Take 1 tablet (20 mg total) by mouth daily.   donepezil  10 MG tablet Commonly known as: ARICEPT  Take 1 tablet (10 mg total) by mouth at bedtime.   Gemtesa 75 MG Tabs Generic drug: Vibegron Take 75 mg by mouth every evening.   levocetirizine 5 MG tablet Commonly known as: XYZAL  TAKE 1 TABLET BY MOUTH ONCE DAILY AS NEEDED FOR ALLERGIES What changed: See the new instructions.   memantine  10 MG tablet Commonly known as: NAMENDA  Take 1 tablet (10 mg total) by mouth 2 (two) times daily.   metoprolol  tartrate 25 MG tablet Commonly known as: LOPRESSOR  Take 0.5 tablets (12.5 mg total) by mouth in the morning. Hold if pulse is less than 60.   telmisartan  40 MG tablet Commonly known as: MICARDIS  Take 1 tablet by mouth once daily   triamcinolone  55 MCG/ACT Aero nasal inhaler Commonly known as: NASACORT  Place 2 sprays into the nose daily. What changed:  when to take this reasons to take this   Vitamin D  50 MCG (2000 UT) Caps Take 2,000 Units by mouth daily.        Follow-up Information     Ronald [name]. Schedule an appointment as soon as possible for a visit.   Specialties: Internal Medicine, Radiology Contact information: [address] [place] [address] [phone]                 Discharge Exam: Ronald [name]   04/07/24 2129   Weight: 71.2 kg   Weight  Constitutional: Awake alert and oriented x3, no associated distress.   Respiratory: clear to auscultation bilaterally, no wheezing, no crackles. Normal respiratory effort. No accessory muscle use.  Cardiovascular: Regular rate and rhythm, no murmurs / rubs / gallops. No extremity edema. 2+ pedal pulses. No carotid bruits.  Abdomen: Abdomen is soft and nontender.  No evidence of intra-abdominal masses.  Positive bowel sounds noted in all quadrants.   Musculoskeletal: No joint deformity upper and lower extremities. Good ROM, no contractures. Normal muscle tone.  Neuro:  severe distal lower extremity loss of sensation in a stocking like distribution.  Strength 5/5 all extremities in proximal and distal muscle groups.  Notable gait instability.      Condition at discharge: fair  The results of significant diagnostics from this hospitalization (including imaging, microbiology, ancillary and laboratory) are listed below for reference.   Imaging Studies: MR BRAIN WO CONTRAST Result Date: 04/08/2024 CLINICAL DATA:  Disequilibrium, poor balance, recurrent falls EXAM: MRI HEAD WITHOUT CONTRAST TECHNIQUE: Multiplanar, multiecho pulse sequences of the brain and surrounding structures were obtained without  intravenous contrast. COMPARISON:  CT head April 07, 2024. FINDINGS: Brain: No acute infarction, hemorrhage, hydrocephalus, extra-axial collection or mass lesion. Remote small lacunar infarct in the right caudate. Cerebral atrophy. Vascular: Normal flow voids. Skull and upper cervical spine: Normal marrow signal. Sinuses/Orbits: Negative. IMPRESSION: No evidence of acute intracranial abnormality Electronically Signed   By: Gilmore GORMAN Molt M.D.   On: 04/08/2024 19:24   DG Chest 2 View Result Date: 04/07/2024 CLINICAL DATA:  Fall EXAM: CHEST - 2 VIEW COMPARISON:  10/16/2023 FINDINGS: Post sternotomy changes. No acute airspace disease or pleural effusion. Normal cardiomediastinal  silhouette with aortic atherosclerosis. No pneumothorax IMPRESSION: No active cardiopulmonary disease. Electronically Signed   By: Luke Bun M.D.   On: 04/07/2024 18:05   CT Cervical Spine Wo Contrast Result Date: 04/07/2024 CLINICAL DATA:  Neck trauma fall EXAM: CT CERVICAL SPINE WITHOUT CONTRAST TECHNIQUE: Multidetector CT imaging of the cervical spine was performed without intravenous contrast. Multiplanar CT image reconstructions were also generated. RADIATION DOSE REDUCTION: This exam was performed according to the departmental dose-optimization program which includes automated exposure control, adjustment of the mA and/or kV according to patient size and/or use of iterative reconstruction technique. COMPARISON:  None Available. FINDINGS: Alignment: Trace retrolisthesis C3 on C4. Facet alignment is maintained. Skull base and vertebrae: No acute fracture. No primary bone lesion or focal pathologic process. Soft tissues and spinal canal: No prevertebral fluid or swelling. No visible canal hematoma. Disc levels: Fusion C4 through C6. Advanced disc space narrowing C3-C4, C6-C7. Multilevel facet degenerative changes. Posterior disc osteophyte and retrolisthesis at C3-C4 results in moderate severe canal stenosis. Bilateral foraminal narrowing at this level. Moderate canal stenosis C4-C5. additional multilevel foraminal narrowing, at least moderate at C6-C7. Upper chest: Negative Other: None IMPRESSION: 1. No CT evidence for acute osseous abnormality. 2. Status post fusion C4 through C6. Advanced degenerative changes at C3-C4 and C6-C7 with moderate to severe canal stenosis at C3-C4 due to retrolisthesis and posterior disc osteophyte complex. Electronically Signed   By: Luke Bun M.D.   On: 04/07/2024 18:04   CT Head Wo Contrast Result Date: 04/07/2024 CLINICAL DATA:  Fall with head trauma EXAM: CT HEAD WITHOUT CONTRAST TECHNIQUE: Contiguous axial images were obtained from the base of the skull through  the vertex without intravenous contrast. RADIATION DOSE REDUCTION: This exam was performed according to the departmental dose-optimization program which includes automated exposure control, adjustment of the mA and/or kV according to patient size and/or use of iterative reconstruction technique. COMPARISON:  CT 10/16/2023 FINDINGS: Brain: No acute territorial infarction, hemorrhage or intracranial mass. Advanced atrophy. Mild chronic small vessel ischemic changes of the white matter. Stable ventricle size. Chronic lacunar infarct in the right caudate Vascular: No hyperdense vessels.  Carotid vascular calcification Skull: Normal. Negative for fracture or focal lesion. Sinuses/Orbits: No acute finding. Other: None IMPRESSION: 1. No CT evidence for acute intracranial abnormality. 2. Advanced atrophy and mild chronic small vessel ischemic changes of the white matter. Chronic lacunar infarct in the right caudate. Electronically Signed   By: Luke Bun M.D.   On: 04/07/2024 17:58    Microbiology: Results for orders placed or performed in visit on 11/11/22  Clostridium difficile EIA     Status: None   Collection Time: 11/12/22  8:58 AM   Specimen: Stool   Stool  Result Value Ref Range Status   C difficile Toxins A+B, EIA Negative Negative Final  Stool culture     Status: None   Collection Time: 11/12/22  8:58 AM  Specimen: Stool   Stool  Result Value Ref Range Status   Salmonella/Shigella Screen Final report  Final   Stool Culture result 1 (RSASHR) Comment  Final    Comment: No Salmonella or Shigella recovered.   Campylobacter Culture Final report  Final   Stool Culture result 1 (CMPCXR) Comment  Final    Comment: No Campylobacter species isolated.   E coli, Shiga toxin Assay Negative Negative Final    Labs: CBC: Recent Labs  Lab 04/07/24 1656 04/08/24 0640  WBC 3.9* 3.8*  NEUTROABS 2.4  --   HGB 9.7* 9.4*  HCT 28.7* 28.7*  MCV 111.7* 116.2*  PLT 199 181   Basic Metabolic  Panel: Recent Labs  Lab 04/07/24 1656 04/08/24 0640  NA 141 142  K 4.5 4.1  CL 108 109  CO2 25 26  GLUCOSE 113* 108*  BUN 38* 30*  CREATININE 1.12 1.11  CALCIUM  9.2 9.0  MG  --  2.2  PHOS  --  3.6   Liver Function Tests: Recent Labs  Lab 04/07/24 1656  AST 31  ALT 30  ALKPHOS 67  BILITOT 0.5  PROT 6.2*  ALBUMIN 4.0   CBG: No results for input(s): GLUCAP in the last 168 hours.  Discharge time spent: greater than 30 minutes.  Signed: Zachary JINNY Ba, [name] 04/10/2024

## 2024-04-12 NOTE — Telephone Encounter (Signed)
 Called and spoke with Rudell and informed her of Dr.Safir's response. She stated she thought this would be the case and would make sure to follow up with us  on Thursday

## 2024-04-15 ENCOUNTER — Inpatient Hospital Stay: Admitting: Internal Medicine

## 2024-04-15 ENCOUNTER — Encounter: Payer: Self-pay | Admitting: Internal Medicine

## 2024-04-15 ENCOUNTER — Ambulatory Visit: Admitting: Internal Medicine

## 2024-04-15 VITALS — BP 118/62 | HR 57 | Temp 98.1°F | Ht 67.0 in | Wt 159.2 lb

## 2024-04-15 DIAGNOSIS — G309 Alzheimer's disease, unspecified: Secondary | ICD-10-CM | POA: Diagnosis not present

## 2024-04-15 DIAGNOSIS — F0280 Dementia in other diseases classified elsewhere without behavioral disturbance: Secondary | ICD-10-CM

## 2024-04-15 DIAGNOSIS — I10 Essential (primary) hypertension: Secondary | ICD-10-CM | POA: Diagnosis not present

## 2024-04-15 DIAGNOSIS — F03A4 Unspecified dementia, mild, with anxiety: Secondary | ICD-10-CM

## 2024-04-15 DIAGNOSIS — R2689 Other abnormalities of gait and mobility: Secondary | ICD-10-CM

## 2024-04-15 DIAGNOSIS — R2681 Unsteadiness on feet: Secondary | ICD-10-CM | POA: Diagnosis not present

## 2024-04-15 DIAGNOSIS — G6289 Other specified polyneuropathies: Secondary | ICD-10-CM

## 2024-04-15 MED ORDER — TELMISARTAN 20 MG PO TABS: 20.0000 mg | ORAL_TABLET | Freq: Every day | ORAL | 3 refills | Status: DC

## 2024-04-15 NOTE — Assessment & Plan Note (Signed)
 Overall stable, laments his wife health is worse than his.  Good med compliance

## 2024-04-15 NOTE — Assessment & Plan Note (Signed)
 Pt for HH with RN, PT OT and aide to start hopefully soon

## 2024-04-15 NOTE — Assessment & Plan Note (Signed)
 Pt to continue cane use, though also has walker available at home if needed

## 2024-04-15 NOTE — Assessment & Plan Note (Signed)
 We are concerned though pt feeling better, the BP today is low normal.  Ok to decrease the micardis  to 20 mg every day for safety and continue to monitor at home

## 2024-04-15 NOTE — Assessment & Plan Note (Signed)
 Pt to f/u neurology as planned

## 2024-04-15 NOTE — Patient Instructions (Signed)
 Ok to reduce the micardis  (telmisartan ) to 20 mg per day  Please continue all other medications as before, and refills have been done if requested.  Please have the pharmacy call with any other refills you may need.  Please keep your appointments with your specialists as you may have planned - Neurology soon  We can hold on lab testing today  You will be contacted regarding the referral for: [facility] with RN, PT, OT, and aide  Please make an Appointment to return in 6 months, or sooner if needed

## 2024-04-15 NOTE — Progress Notes (Signed)
 Patient ID: Ronald [name], male   DOB: [birth year], 89 y.o.   MRN: [id number]        Chief Complaint: follow up post hospn July 23 - 24 with gait disorder and falls       HPI:  Ronald [name] is a 89 y.o. male here with daughter who provides history and guidance; overall improved it seems after recent recurrent falls and worsening gait and balance difficulties.  Tx with IVF, and torsemide  stopped.  Does also have what sounds like vertigo with head movement such as turning over in bed, feels like something rattling around but only last a brief time.  Has New hearing aids and so bilat cataract so vision much improved.  Still walks with quad cane but needs PT at home with [facility] with [facility].  No other pain complaints.  Has ongoing tremor and peripheral neuropathy and plans to f/u with Neurology next month.  Dementia overall stable symptomatically, and not assoc with behavioral changes such as hallucinations, paranoia, or agitation.  Wt Readings from Last 3 Encounters:  04/15/24 159 lb 3.2 oz (72.2 kg)  04/07/24 156 lb 15.5 oz (71.2 kg)  01/05/24 159 lb (72.1 kg)   BP Readings from Last 3 Encounters:  04/15/24 118/62  04/08/24 138/73  04/07/24 131/65         Past Medical History:  Diagnosis Date   ALLERGIC RHINITIS 11/25/2007   ANXIETY 11/25/2007   COLONIC POLYPS, HX OF 11/25/2007   CORONARY ARTERY DISEASE 11/25/2007   Cough 01/04/2009   HEMORRHOIDS, INTERNAL 11/06/2010   HOARSENESS 01/04/2009   HYPERLIPIDEMIA 11/25/2007   HYPERTENSION 11/25/2007   Impaired glucose tolerance 04/21/2013   LOW BACK PAIN 11/25/2007   Lumbar radiculopathy, chronic    with right foot drop   PEPTIC ULCER DISEASE 11/25/2007   Peroneal nerve lesion of lower extremity, bilateral 05/08/2017   Preventative health care 04/07/2011   PSA, INCREASED 01/05/2009   Right foot drop    Spasmodic dysphonia    TREMOR, ESSENTIAL 11/25/2007   Past Surgical History:  Procedure Laterality Date   APPENDECTOMY     BACK SURGERY      CORONARY ARTERY BYPASS GRAFT     s/p lumbar laminectomy and fusion     TONSILLECTOMY      reports that he has never smoked. He has never used smokeless tobacco. He reports that he does not currently use alcohol. He reports that he does not use drugs. family history includes Colon cancer in his father. No Known Allergies Current Outpatient Medications on File Prior to Visit  Medication Sig Dispense Refill   aspirin  81 MG tablet Take 81 mg by mouth daily.     atorvastatin  (LIPITOR) 40 MG tablet TAKE 1 TABLET BY MOUTH ONCE DAILY IN THE EVENING 90 tablet 0   b complex vitamins tablet Take 1 tablet by mouth daily.     Cholecalciferol  (VITAMIN D ) 2000 UNITS CAPS Take 2,000 Units by mouth daily.     citalopram  (CELEXA ) 20 MG tablet Take 1 tablet (20 mg total) by mouth daily. 90 tablet 3   donepezil  (ARICEPT ) 10 MG tablet Take 1 tablet (10 mg total) by mouth at bedtime. 90 tablet 3   GEMTESA 75 MG TABS Take 75 mg by mouth every evening.     levocetirizine (XYZAL ) 5 MG tablet TAKE 1 TABLET BY MOUTH ONCE DAILY AS NEEDED FOR ALLERGIES (Patient taking differently: Take 5 mg by mouth daily as needed for allergies.) 90 tablet 0   memantine  (  NAMENDA ) 10 MG tablet Take 1 tablet (10 mg total) by mouth 2 (two) times daily. 60 tablet 11   metoprolol  tartrate (LOPRESSOR ) 25 MG tablet Take 0.5 tablets (12.5 mg total) by mouth in the morning. Hold if pulse is less than 60. 45 tablet 3   triamcinolone  (NASACORT ) 55 MCG/ACT AERO nasal inhaler Place 2 sprays into the nose daily. (Patient taking differently: Place 2 sprays into the nose daily as needed (allergies).) 1 each 12   No current facility-administered medications on file prior to visit.        ROS:  All others reviewed and negative.  Objective        PE:  BP 118/62   Pulse (!) 57   Temp 98.1 F (36.7 C)   Ht 5' 7 (1.702 m)   Wt 159 lb 3.2 oz (72.2 kg)   SpO2 95%   BMI 24.93 kg/m                 Constitutional: Pt appears in NAD                HENT: Head: NCAT.                Right Ear: External ear normal.                 Left Ear: External ear normal.                Eyes: . Pupils are equal, round, and reactive to light. Conjunctivae and EOM are normal               Nose: without d/c or deformity               Neck: Neck supple. Gross normal ROM               Cardiovascular: Normal rate and regular rhythm.                 Pulmonary/Chest: Effort normal and breath sounds without rales or wheezing.                Abd:  Soft, NT, ND, + BS, no organomegaly               Neurological: Pt is alert. At baseline orientation, motor grossly intact               Skin: Skin is warm. No rashes, no other new lesions, LE edema - trace ankle blateral               Psychiatric: Pt behavior is normal without agitation   Micro: none  Cardiac tracings I have personally interpreted today:  none  Pertinent Radiological findings (summarize): none   Lab Results  Component Value Date   WBC 3.8 (L) 04/08/2024   HGB 9.4 (L) 04/08/2024   HCT 28.7 (L) 04/08/2024   PLT 181 04/08/2024   GLUCOSE 108 (H) 04/08/2024   CHOL 134 10/08/2022   TRIG 56.0 10/08/2022   HDL 61.50 10/08/2022   LDLCALC 62 10/08/2022   ALT 30 04/07/2024   AST 31 04/07/2024   NA 142 04/08/2024   K 4.1 04/08/2024   CL 109 04/08/2024   CREATININE 1.11 04/08/2024   BUN 30 (H) 04/08/2024   CO2 26 04/08/2024   TSH 2.78 06/25/2023   PSA 2.00 01/25/2010   HGBA1C 6.5 10/08/2022   Assessment/Plan:  Ronald [name] is a 89 y.o. White or Caucasian [1] male with  has a past medical history of ALLERGIC RHINITIS (11/25/2007), ANXIETY (11/25/2007), COLONIC POLYPS, HX OF (11/25/2007), CORONARY ARTERY DISEASE (11/25/2007), Cough (01/04/2009), HEMORRHOIDS, INTERNAL (11/06/2010), HOARSENESS (01/04/2009), HYPERLIPIDEMIA (11/25/2007), HYPERTENSION (11/25/2007), Impaired glucose tolerance (04/21/2013), LOW BACK PAIN (11/25/2007), Lumbar radiculopathy, chronic, PEPTIC ULCER DISEASE (11/25/2007), Peroneal nerve  lesion of lower extremity, bilateral (05/08/2017), Preventative health care (04/07/2011), PSA, INCREASED (01/05/2009), Right foot drop, Spasmodic dysphonia, and TREMOR, ESSENTIAL (11/25/2007).  Gait instability Pt for HH with RN, PT OT and aide to start hopefully soon  Balance disorder Pt to continue cane use, though also has walker available at home if needed  Dementia due to Alzheimer's disease (HCC) Overall stable, laments his wife health is worse than his.  Good med compliance  Peripheral neuropathy Pt to f/u neurology as planned  Essential hypertension We are concerned though pt feeling better, the BP today is low normal.  Ok to decrease the micardis  to 20 mg every day for safety and continue to monitor at home  Followup: No follow-ups on file.  Lynwood Rush, MD 04/15/2024 12:56 PM [facility] Internal Medicine

## 2024-04-21 DIAGNOSIS — G309 Alzheimer's disease, unspecified: Secondary | ICD-10-CM | POA: Diagnosis not present

## 2024-04-21 DIAGNOSIS — I2510 Atherosclerotic heart disease of native coronary artery without angina pectoris: Secondary | ICD-10-CM | POA: Diagnosis not present

## 2024-04-21 DIAGNOSIS — R2681 Unsteadiness on feet: Secondary | ICD-10-CM | POA: Diagnosis not present

## 2024-04-21 DIAGNOSIS — Z7982 Long term (current) use of aspirin: Secondary | ICD-10-CM | POA: Diagnosis not present

## 2024-04-21 DIAGNOSIS — I10 Essential (primary) hypertension: Secondary | ICD-10-CM | POA: Diagnosis not present

## 2024-04-22 ENCOUNTER — Telehealth: Payer: Self-pay | Admitting: Internal Medicine

## 2024-04-22 NOTE — Telephone Encounter (Signed)
 Ok for [employer]

## 2024-04-22 NOTE — Telephone Encounter (Signed)
 Spoke to Deandra and made her aware of providers approval via verbal orders.

## 2024-04-22 NOTE — Telephone Encounter (Signed)
 Copied from CRM #[id number]. Topic: Clinical - Home Health Verbal Orders >> Apr 21, 2024  4:33 PM Harlene ORN wrote: Caller/Agency: Deandra - [facility] Callback Number: [phone] Service Requested: Skilled Nursing Frequency: once a week for four weeks Any new concerns about the patient? No

## 2024-04-23 NOTE — Telephone Encounter (Unsigned)
 Copied from CRM [id number]. Topic: Clinical - Home Health Verbal Orders >> Apr 23, 2024  3:47 PM Burnard DEL wrote: Caller/Agency: [facility] Callback Number: [phone]   Evaluation will be moved to Monday August 11,2025 instead of today 04/23/2024.

## 2024-04-23 NOTE — Telephone Encounter (Signed)
 Acknowledged.

## 2024-04-29 ENCOUNTER — Telehealth: Payer: Self-pay

## 2024-04-29 NOTE — Telephone Encounter (Signed)
 Copied from CRM [id number]. Topic: Clinical - Home Health Verbal Orders >> Apr 29, 2024 10:25 AM Daved SQUIBB wrote: Caller/Agency: [facility] Callback Number: [phone] Service Requested: Home health pt  Frequency: 2 times for 3 weeks and 1 for 3 weeks Any new concerns about the patient? No

## 2024-04-29 NOTE — Telephone Encounter (Unsigned)
 Copied from CRM [id number]. Topic: Clinical - Home Health Verbal Orders >> Apr 28, 2024  3:34 PM Taleah C wrote: Caller/Agency: [facility] w/ [facility] Callback Number: [phone] Service Requested: Physical Therapy Frequency: 2 week 3, 1 week 3  Any new concerns about the patient? No

## 2024-04-30 DIAGNOSIS — R2681 Unsteadiness on feet: Secondary | ICD-10-CM | POA: Diagnosis not present

## 2024-04-30 DIAGNOSIS — G309 Alzheimer's disease, unspecified: Secondary | ICD-10-CM | POA: Diagnosis not present

## 2024-04-30 DIAGNOSIS — I2510 Atherosclerotic heart disease of native coronary artery without angina pectoris: Secondary | ICD-10-CM | POA: Diagnosis not present

## 2024-04-30 DIAGNOSIS — Z7982 Long term (current) use of aspirin: Secondary | ICD-10-CM | POA: Diagnosis not present

## 2024-04-30 DIAGNOSIS — I10 Essential (primary) hypertension: Secondary | ICD-10-CM | POA: Diagnosis not present

## 2024-04-30 NOTE — Telephone Encounter (Signed)
 Verbal orders given

## 2024-04-30 NOTE — Telephone Encounter (Signed)
 Ok for [employer]

## 2024-05-05 DIAGNOSIS — I10 Essential (primary) hypertension: Secondary | ICD-10-CM | POA: Diagnosis not present

## 2024-05-05 DIAGNOSIS — Z7982 Long term (current) use of aspirin: Secondary | ICD-10-CM | POA: Diagnosis not present

## 2024-05-05 DIAGNOSIS — I2510 Atherosclerotic heart disease of native coronary artery without angina pectoris: Secondary | ICD-10-CM | POA: Diagnosis not present

## 2024-05-06 DIAGNOSIS — I10 Essential (primary) hypertension: Secondary | ICD-10-CM | POA: Diagnosis not present

## 2024-05-06 DIAGNOSIS — Z7982 Long term (current) use of aspirin: Secondary | ICD-10-CM | POA: Diagnosis not present

## 2024-05-06 DIAGNOSIS — G309 Alzheimer's disease, unspecified: Secondary | ICD-10-CM | POA: Diagnosis not present

## 2024-05-06 DIAGNOSIS — R2681 Unsteadiness on feet: Secondary | ICD-10-CM | POA: Diagnosis not present

## 2024-05-06 DIAGNOSIS — I2510 Atherosclerotic heart disease of native coronary artery without angina pectoris: Secondary | ICD-10-CM | POA: Diagnosis not present

## 2024-05-11 DIAGNOSIS — I2510 Atherosclerotic heart disease of native coronary artery without angina pectoris: Secondary | ICD-10-CM | POA: Diagnosis not present

## 2024-05-11 DIAGNOSIS — I10 Essential (primary) hypertension: Secondary | ICD-10-CM | POA: Diagnosis not present

## 2024-05-11 DIAGNOSIS — G309 Alzheimer's disease, unspecified: Secondary | ICD-10-CM | POA: Diagnosis not present

## 2024-05-11 DIAGNOSIS — Z7982 Long term (current) use of aspirin: Secondary | ICD-10-CM | POA: Diagnosis not present

## 2024-05-11 DIAGNOSIS — R2681 Unsteadiness on feet: Secondary | ICD-10-CM | POA: Diagnosis not present

## 2024-05-12 DIAGNOSIS — Z7982 Long term (current) use of aspirin: Secondary | ICD-10-CM | POA: Diagnosis not present

## 2024-05-12 DIAGNOSIS — R2681 Unsteadiness on feet: Secondary | ICD-10-CM | POA: Diagnosis not present

## 2024-05-12 DIAGNOSIS — G309 Alzheimer's disease, unspecified: Secondary | ICD-10-CM | POA: Diagnosis not present

## 2024-05-12 DIAGNOSIS — I10 Essential (primary) hypertension: Secondary | ICD-10-CM | POA: Diagnosis not present

## 2024-05-12 DIAGNOSIS — I2510 Atherosclerotic heart disease of native coronary artery without angina pectoris: Secondary | ICD-10-CM | POA: Diagnosis not present

## 2024-05-13 DIAGNOSIS — I2510 Atherosclerotic heart disease of native coronary artery without angina pectoris: Secondary | ICD-10-CM | POA: Diagnosis not present

## 2024-05-19 DIAGNOSIS — I2510 Atherosclerotic heart disease of native coronary artery without angina pectoris: Secondary | ICD-10-CM | POA: Diagnosis not present

## 2024-05-19 DIAGNOSIS — Z7982 Long term (current) use of aspirin: Secondary | ICD-10-CM | POA: Diagnosis not present

## 2024-05-19 DIAGNOSIS — G309 Alzheimer's disease, unspecified: Secondary | ICD-10-CM | POA: Diagnosis not present

## 2024-05-19 DIAGNOSIS — R2681 Unsteadiness on feet: Secondary | ICD-10-CM | POA: Diagnosis not present

## 2024-05-19 DIAGNOSIS — I10 Essential (primary) hypertension: Secondary | ICD-10-CM | POA: Diagnosis not present

## 2024-05-20 DIAGNOSIS — G309 Alzheimer's disease, unspecified: Secondary | ICD-10-CM | POA: Diagnosis not present

## 2024-05-20 DIAGNOSIS — I2510 Atherosclerotic heart disease of native coronary artery without angina pectoris: Secondary | ICD-10-CM | POA: Diagnosis not present

## 2024-05-20 DIAGNOSIS — Z7982 Long term (current) use of aspirin: Secondary | ICD-10-CM | POA: Diagnosis not present

## 2024-05-20 DIAGNOSIS — R2681 Unsteadiness on feet: Secondary | ICD-10-CM | POA: Diagnosis not present

## 2024-05-20 DIAGNOSIS — I10 Essential (primary) hypertension: Secondary | ICD-10-CM | POA: Diagnosis not present

## 2024-05-21 DIAGNOSIS — G309 Alzheimer's disease, unspecified: Secondary | ICD-10-CM | POA: Diagnosis not present

## 2024-05-21 DIAGNOSIS — I2510 Atherosclerotic heart disease of native coronary artery without angina pectoris: Secondary | ICD-10-CM | POA: Diagnosis not present

## 2024-05-21 DIAGNOSIS — I10 Essential (primary) hypertension: Secondary | ICD-10-CM | POA: Diagnosis not present

## 2024-05-21 DIAGNOSIS — R2681 Unsteadiness on feet: Secondary | ICD-10-CM | POA: Diagnosis not present

## 2024-05-26 DIAGNOSIS — G309 Alzheimer's disease, unspecified: Secondary | ICD-10-CM | POA: Diagnosis not present

## 2024-05-26 DIAGNOSIS — Z7982 Long term (current) use of aspirin: Secondary | ICD-10-CM | POA: Diagnosis not present

## 2024-05-26 DIAGNOSIS — R2681 Unsteadiness on feet: Secondary | ICD-10-CM | POA: Diagnosis not present

## 2024-05-26 DIAGNOSIS — I10 Essential (primary) hypertension: Secondary | ICD-10-CM | POA: Diagnosis not present

## 2024-05-26 DIAGNOSIS — I2510 Atherosclerotic heart disease of native coronary artery without angina pectoris: Secondary | ICD-10-CM | POA: Diagnosis not present

## 2024-05-28 NOTE — Progress Notes (Signed)
 Date:  06/04/2024   ID:  Ronald [name], DOB [birth year], MRN [id number]  Provider Location: Office  PCP:  Ronald Lynwood ORN, MD  Cardiologist:   [name]:  None   Evaluation Performed:  Follow-Up Visit  Chief Complaint:  CAD/CABG  History of Present Illness:    89 y.o. history of CABG 1998 Cath 2003 with occluded SVG RCA Rx medically Activity limited by bilateral foot drop. No angina. Wife is battling lung cancer. Uses lasix  for LE edema. 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 and exercising a couple of hours / day Seeing [name] for right hand trigger finger Wife has had lung cancer for 10 years on [place]   No angina  LDL at goal on statin 54 11/27/21   Has senile purura/bruising with poor balance using cane No falls PLT ok at 223   Sees [name] neurology for tremor and memory changes on Inderal  and declined cognitive testing   Doing very well with no angina Wife's health is very bad Has lung cancer and full time care has a son Ronald [name] who is a GP in [place] His two other sons have a 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 in [place]  Has had GI incontinence which is very bothersome to him Has seen GI and stool sample pending Tremors worse with TIC like movements on face   10/20/23 seen by PA for syncope.  Echo 11/10/23 EF 60-65% normal RV no significant valve dx. Carotids same day plaque no stenosis. Monitor 2/25  Average HR 74 SVT longest 19 seconds PAC;s 14.9% beats PVC;s < 1%. Started on beta blockers. Lopressor  12.5 mg bid No AV block He has chronic RBBB   Not postural in clinic today systolic BP standing 140 mmHg He stopped normadyne on his own which is fine Pre syncope always preceded by clicking noise in back of his head and not always related to position changes   Past Medical History:  Diagnosis Date   ALLERGIC RHINITIS 11/25/2007   ANXIETY 11/25/2007   COLONIC POLYPS,  HX OF 11/25/2007   CORONARY ARTERY DISEASE 11/25/2007   Cough 01/04/2009   HEMORRHOIDS, INTERNAL 11/06/2010   HOARSENESS 01/04/2009   HYPERLIPIDEMIA 11/25/2007   HYPERTENSION 11/25/2007   Impaired glucose tolerance 04/21/2013   LOW BACK PAIN 11/25/2007   Lumbar radiculopathy, chronic    with right foot drop   PEPTIC ULCER DISEASE 11/25/2007   Peroneal nerve lesion of lower extremity, 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   atorvastatin  (LIPITOR) 40 MG tablet TAKE 1 TABLET BY MOUTH ONCE DAILY IN THE EVENING   b complex vitamins tablet Take 1 tablet by mouth daily.   Cholecalciferol  (VITAMIN D ) 2000 UNITS CAPS Take 2,000 Units by mouth daily.   citalopram  (CELEXA ) 20 MG tablet Take 1 tablet (20 mg total) by mouth daily.   donepezil  (ARICEPT ) 10 MG tablet Take 1 tablet (10 mg total) by  mouth at bedtime.   GEMTESA 75 MG TABS Take 75 mg by mouth every evening.   levocetirizine (XYZAL ) 5 MG tablet TAKE 1 TABLET BY MOUTH ONCE DAILY AS NEEDED FOR ALLERGIES (Patient taking differently: Take 5 mg by mouth daily as needed for allergies.)     Allergies:   Patient has no known allergies.   Social History   Tobacco Use   Smoking status: Never   Smokeless tobacco: Never  Vaping Use   Vaping status: Never Used  Substance Use Topics   Alcohol use: Not Currently    Comment: Occ   Drug use: No     Family Hx: The patient's family history includes Colon cancer in his father.  ROS:   Please see the history of present illness.     All other systems reviewed and are negative.   Prior CV studies:   The following  studies were reviewed today:  Myvue 2008 Carotid 2012   Labs/Other Tests and Data Reviewed:    EKG:  SR LAD / RBBB rate 57  no acute changes 06/04/2024   Recent Labs: 06/25/2023: TSH 2.78 04/07/2024: ALT 30 04/08/2024: BUN 30; Creatinine, Ser 1.11; Hemoglobin 9.4; Magnesium 2.2; Platelets 181; Potassium 4.1; Sodium 142   Recent Lipid Panel Lab Results  Component Value Date/Time   CHOL 134 10/08/2022 10:54 AM   TRIG 56.0 10/08/2022 10:54 AM   HDL 61.50 10/08/2022 10:54 AM   CHOLHDL 2 10/08/2022 10:54 AM   LDLCALC 62 10/08/2022 10:54 AM    Wt Readings from Last 3 Encounters:  06/04/24 161 lb 3.2 oz (73.1 kg)  04/15/24 159 lb 3.2 oz (72.2 kg)  04/07/24 156 lb 15.5 oz (71.2 kg)     Objective:    Vital Signs:  BP (!) 144/70 (BP Location: Left Arm, Patient Position: Sitting, Cuff Size: Normal)   Pulse 70   Wt 161 lb 3.2 oz (73.1 kg)   SpO2 (!) 70%   BMI 25.25 kg/m    Affect appropriate Healthy:  appears stated age HEENT: normal Neck supple with no adenopathy JVP normal no bruits no thyromegaly Lungs clear with no wheezing and good diaphragmatic motion Heart:  S1/S2 no murmur, no rub, gallop or click PMI normal post sternotomy  Abdomen: benighn, BS positve, no tenderness, no AAA no bruit.  No HSM or HJR Distal pulses intact with no bruits Trace LE  edema Neuro non-focal head tremor in yes direction with TICS Skin warm and dry Chronic right foot drop  Right middle finger trigger finger    ASSESSMENT & PLAN:    1. CAD/CABG:  CABG 98 cath 2003 occluded RCA graft.  Myovue 2008 non ischemic Active working out at gym everyday no symptoms continue medical Rx stable   2. HTN: Well controlled.  Continue current medications and low sodium Dash type diet.  Inderal /Micardis     3. Chol:  - at goal LDL 61 11/16/19 continue statin   4. Bifasicular block:  stable no high grade AV block yearly ECG   5. Edema:  Dependant continue lasix  low sodium diet labs with primary   6.  Ortho:  F/u with Dr [name] for injured left shoulder still with some restriction to motion    7. Neuro:  F/u [name] tremor and right foot drop with some cognitive impairment and gait disorder with dementia and poor hearing, vertigo. Vision improved post cataract surgery   8. GI:  explosive urge incontinency ? Infection f/u GI  9. Syncope:  monitor 2/25 with  some SVT but should not cause syncope with normal EF and no valve Dx Chronic bifasicular block with no high grade AV block on monitor 11/11/23 I Beta blockers d/c F/U with Neuro Not postural in office      COVID-19 Education: The signs and symptoms of COVID-19 were discussed with the patient and how to seek care for testing (follow up with PCP or arrange E-visit).  The importance of social distancing was discussed today.   Medication Adjustments/Labs and Tests Ordered: Current medicines are reviewed at length with the patient today.  Concerns regarding medicines are outlined above.   Tests Ordered:  None   Medication Changes:  None   Disposition:  Follow up in a year needs f/u with [name] Neurology  Signed, Maude Emmer, MD  06/04/2024 10:20 AM    [facility]

## 2024-06-02 ENCOUNTER — Encounter (HOSPITAL_BASED_OUTPATIENT_CLINIC_OR_DEPARTMENT_OTHER): Payer: Self-pay

## 2024-06-02 ENCOUNTER — Telehealth: Payer: Self-pay

## 2024-06-02 NOTE — Telephone Encounter (Signed)
 Copied from CRM [id number]. Topic: Clinical - Medication Question >> Jun 02, 2024 11:32 AM Tinnie BROCKS wrote: Reason for CRM: Daughter Dagoberto (POA, on [place]) is wondering if they can stop taking pt to the urologist and have Dr. Norleen continue his prescription for GEMTESA 75 MG TABS. She says it is hard to get him to go to more appointments and difficult for him to leave urine samples with them due to his tremors and it would be a lot easier if they could see Dr. Norleen only. If you have any questions, please call daughter Dagoberto at [phone]. He will be out in a couple weeks and prefers: [employer] [facility] [address] [place], [place] - [address] [place] [address] Phone: [phone] Fax: [phone]

## 2024-06-04 ENCOUNTER — Ambulatory Visit: Attending: Cardiovascular Disease | Admitting: Cardiovascular Disease

## 2024-06-04 ENCOUNTER — Encounter: Payer: Self-pay | Admitting: Cardiovascular Disease

## 2024-06-04 VITALS — BP 144/70 | HR 70 | Wt 161.2 lb

## 2024-06-04 DIAGNOSIS — I452 Bifascicular block: Secondary | ICD-10-CM | POA: Diagnosis not present

## 2024-06-04 DIAGNOSIS — R55 Syncope and collapse: Secondary | ICD-10-CM | POA: Diagnosis not present

## 2024-06-04 DIAGNOSIS — Z951 Presence of aortocoronary bypass graft: Secondary | ICD-10-CM

## 2024-06-04 DIAGNOSIS — R001 Bradycardia, unspecified: Secondary | ICD-10-CM

## 2024-06-04 DIAGNOSIS — E782 Mixed hyperlipidemia: Secondary | ICD-10-CM

## 2024-06-04 NOTE — Patient Instructions (Addendum)
 Medication Instructions:  Your physician recommends that you continue on your current medications as directed. Please refer to the Current Medication list given to you today.  *If you need a refill on your cardiac medications before your next appointment, please call your pharmacy*  Lab Work: If you have labs (blood work) drawn today and your tests are completely normal, you will receive your results only by: MyChart Message (if you have MyChart) OR A paper copy in the mail If you have any lab test that is abnormal or we need to change your treatment, we will call you to review the results.  Testing/Procedures: None ordered today.  Follow-Up: At [facility], you and your health needs are our priority.  As part of our continuing mission to provide you with exceptional heart care, our providers are all part of one team.  This team includes your primary Cardiologist (physician) and Advanced Practice Providers or APPs (Physician Assistants and Nurse Practitioners) who all work together to provide you with the care you need, when you need it.  Your next appointment:   6 month(s)  Provider:   Janelle Mediate, MD    We recommend signing up for the patient portal called "MyChart".  Sign up information is provided on this After Visit Summary.  MyChart is used to connect with patients for Virtual Visits (Telemedicine).  Patients are able to view lab/test results, encounter notes, upcoming appointments, etc.  Non-urgent messages can be sent to your provider as well.   To learn more about what you can do with MyChart, go to [email or web address].   Other Instructions

## 2024-06-06 ENCOUNTER — Other Ambulatory Visit: Payer: Self-pay | Admitting: Internal Medicine

## 2024-06-08 DIAGNOSIS — Z7982 Long term (current) use of aspirin: Secondary | ICD-10-CM | POA: Diagnosis not present

## 2024-06-08 DIAGNOSIS — R2681 Unsteadiness on feet: Secondary | ICD-10-CM | POA: Diagnosis not present

## 2024-06-08 DIAGNOSIS — I2510 Atherosclerotic heart disease of native coronary artery without angina pectoris: Secondary | ICD-10-CM | POA: Diagnosis not present

## 2024-06-08 DIAGNOSIS — I10 Essential (primary) hypertension: Secondary | ICD-10-CM | POA: Diagnosis not present

## 2024-06-09 NOTE — Telephone Encounter (Signed)
 Ok we can do this, thanks

## 2024-06-14 ENCOUNTER — Other Ambulatory Visit: Payer: Self-pay

## 2024-06-14 MED ORDER — GEMTESA 75 MG PO TABS: 75.0000 mg | ORAL_TABLET | Freq: Every evening | ORAL | 3 refills | Status: DC

## 2024-06-14 NOTE — Telephone Encounter (Signed)
 Gemtesa has since been filled

## 2024-07-07 NOTE — Progress Notes (Signed)
 Assessment/Plan:   Dementia likely due to Alzheimer's disease, late onset, with behavioral disturbance***  Ronald [name] is a very pleasant 89 y.o. RH male with a history of hypertension, hyperlipidemia, essential tremors and a diagnosis of dementia likely due to Alzheimer's disease, late onset with behavioral disturbance seen today in follow up for memory loss. Patient is currently on donepezil  10 mg daily and memantine  10 mg twice daily, tolerating well..  Patient is able to participate on ADLs to his ability***.  Mood is good.    Follow up in   months. Continue episil 10 mg daily and memantine  10 mg twice daily, side effects discussed Recommend good control of her cardiovascular risk factors Continue to control mood as per PCP Continue to monitor hearing to improve comprehension, continue the use of hearing aids PT  Essential tremor, stable He has a history of bilateral DVT of the hands face.  Unfortunately, he knew Inderal  LA in view of his history of bradycardia and syncopal events. Tremors are stable at this time, he is still able to participate in all his ADLs to his ability    Subjective:    This patient is accompanied in the office by his wife, daughter-in-law who supplements the history.  Previous records as well as any outside records available were reviewed prior to todays visit. Patient was last seen on 01/05/2024***   Any changes in memory since last visit?   She continues to have difficulties with short-term memory, but long-term memory is fair. repeats oneself?  Endorsed Disoriented when walking into a room? Denies ***  Leaving objects?  May misplace things but not in unusual places***  Wandering behavior?  denies   Any personality changes since last visit?  Denies.   Any worsening depression?:  Denies.   Hallucinations or paranoia?  Occasionally he has visual hallucinations such as a man at the end of the bed and then goes away, but this is not frightening to  him. Seizures? denies    Any sleep changes?  Sleeps well unless he has to get up to urinate.  He does report vivid dreams denies REM behavior    or sleepwalking   Sleep apnea?   Denies.   Any hygiene concerns? Denies.  Independent of bathing and dressing?  Occasionally he has to be reminded. Does the patient needs help with medications?  Patient is in charge uses pillbox*** Who is in charge of the finances?  Patient is in charge   *** Any changes in appetite?  denies.  He is very consistent with his food intake.***   Patient have trouble swallowing? Denies.   Does the patient cook? No Any headaches?   denies   Any vision changes?*** Chronic back pain  denies   Ambulates with difficulty?  He has a history of right foot drop, chronic, need to ambulate.  *** Recent falls or head injuries?  On July 2025 the patient sustained a fall without LOC but with trauma to the neck, without acute findings.  Imaging Unilateral weakness, numbness or tingling?  He has peripheral neuropathy which may limit some mobility. Any tremors?  Yes history of VTE worse when he is trying to write or sign.  He also has a history of head tremor and mild right hand tremor*** Any anosmia?  Denies   Any incontinence of urine?  Endorsed followed by urology he is on Vesicare ***  Any bowel dysfunction?   Denies      Patient lives with his wife***HH with Center  well Does the patient drive? No longer drives after getting lost***  Initial visit 06/25/2023  How long did patient have memory difficulties?  For the last 2 years.  Patient has difficulty remembering new information, recent conversations and names of people, sometimes of his grandchildren. Cannot retain new information. LTM is good, able to recall details from the past repeats oneself?  Endorsed, frequently  disoriented when walking into a room? Denies  Sometimes I wake up and I don't know where he is.  Leaving objects in unusual places?   Denies.  Wandering  behavior? Denies.   Any personality changes, or depression, anxiety?  Has moments of irritability and depression but not often.  Hallucinations or paranoia?   I saw a man at the end of the bed and it went away, it happened several times at night but this is not frightening   Seizures? Denies.    Any sleep changes?  Sleeps well. He reports recent vivid dreams, REM behavior. He may have some sleepwalking, and can end up in the closet.   Sleep apnea? Denies.   Any hygiene concerns? He needs to be reminded at times Independent of bathing and dressing?  Denies  Who is in charge of the medications? Patient is in charge   Who is in charge of the finances?  Patient is in charge     Any changes in appetite?  Decreased. I eat the same everyday, except for dinner time  .I'm always full.  Patient have trouble swallowing?  Denies.   Does the patient cook?  No   Any headaches?  Denies.   Chronic back pain?  Denies.   Ambulates with difficulty?  Patient has a history of right foot drop (chronic needs a walker to ambulate) Takes a lot of tylenol  daily for pain.  Recent falls or head injuries? Clemens out of the bed with head injury but no LOC.   He wrestled in college I am sure I got kicked several times.  Vision changes? Denies. Stroke like symptoms?  Denies.   Any tremors?  He has a history of essential tremors, initially seen in our office, placed on Inderal  LA with good response.  His tremor is worse when trying to write or sign names he also has a history of head tremor and a history of mild rest tremor in the right hand. Any anosmia?  Denies.   Any incontinence of urine?  Endorsed, has urine frequency he takes Vesicare , he is awaiting urology appointment. Any bowel dysfunction? Denies.      Patient lives  with his wife  History of heavy alcohol intake? Denies.   History of heavy tobacco use? Denies.   Family history of dementia?  Mother had dementia ?type .   Does patient drive? 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    CT of the C-spine July 2025, without acute osseous abnormality, moderate severe canal stenosis at C3-C4 due to retrolisthesis exterior disc osteophyte complex  PREVIOUS MEDICATIONS:   CURRENT MEDICATIONS:  Outpatient Encounter Medications as of 07/08/2024  Medication Sig  aspirin  81 MG tablet Take 81 mg by mouth daily.   atorvastatin  (LIPITOR) 40 MG tablet TAKE 1 TABLET BY MOUTH ONCE DAILY IN THE EVENING   b complex vitamins tablet Take 1 tablet by mouth daily.   Cholecalciferol  (VITAMIN D ) 2000 UNITS CAPS Take 2,000 Units by mouth daily.   citalopram  (CELEXA ) 20 MG tablet Take 1 tablet (20 mg total) by mouth daily.   donepezil  (ARICEPT ) 10 MG tablet Take 1 tablet (10 mg total) by mouth at bedtime.   GEMTESA 75 MG TABS Take 1 tablet (75 mg total) by mouth every evening.   levocetirizine (XYZAL ) 5 MG tablet TAKE 1 TABLET BY MOUTH ONCE DAILY AS NEEDED FOR ALLERGIES (Patient taking differently: Take 5 mg by mouth daily as needed for allergies.)   memantine  (NAMENDA ) 10 MG tablet Take 1 tablet (10 mg total) by mouth 2 (two) times daily.   telmisartan  (MICARDIS ) 20 MG tablet Take 1 tablet (20 mg total) by mouth daily.   triamcinolone  (NASACORT ) 55 MCG/ACT AERO nasal inhaler Place 2 sprays into the nose daily. (Patient not taking: Reported on 06/04/2024)   No facility-administered encounter medications on file as of 07/08/2024.       11/06/2016    9:13 AM  MMSE - Mini Mental State Exam   Orientation to time 5   Orientation to Place 5   Registration 3   Attention/ Calculation 5   Recall 2   Language- name 2 objects 2   Language- repeat 1  Language- follow 3 step command 3   Language- read & follow direction 1   Write a sentence 1   Copy design 1   Total score 29      Data saved with a previous flowsheet row definition      06/25/2023    1:00 PM  Montreal Cognitive Assessment   Visuospatial/ Executive (0/5) 2  Naming (0/3) 0  Attention: Read list of digits (0/2) 2  Attention: Read list of letters (0/1) 1  Attention: Serial 7 subtraction starting at 100 (0/3) 0  Language: Repeat phrase (0/2) 0  Language : Fluency (0/1) 1  Abstraction (0/2) 1  Delayed Recall (0/5) 0  Orientation (0/6) 4  Total 11  Adjusted Score (based on education) 11    Objective:     PHYSICAL EXAMINATION:    VITALS:  There were no vitals filed for this visit.  GEN:  The patient appears stated age and is in NAD. HEENT:  Normocephalic, atraumatic.   Neurological examination:  General: NAD, well-groomed, appears stated age. Orientation: The patient is alert. Oriented to person, place and not date Cranial nerves: There is good facial symmetry.The speech is fluent and clear. No aphasia or dysarthria. Fund of knowledge is appropriate. Recent and remote memory are impaired. Attention and concentration are reduced. Able to name objects and repeat phrases.  Hearing is decreased to conversational tone. *** Sensation: Sensation is intact to light touch throughout Motor: Strength is at least antigravity x4. DTR's 2/4 in UE/LE     Movement examination: Tone: There is normal tone in the UE/LE Abnormal movements: Mild right hand resting tremor, bilateral mild intention tremor, resting head tremor.  No myoclonus.  No asterixis.   Coordination:  There is no decremation with RAM's. Normal finger to nose  Gait and Station: The patient has no*** difficulty arising out of a deep-seated chair  without the use of the hands.  He needs a walker to ambulate due to chronic right foot drop, right ataxia  noted, mild.  The patient's stride length is good.  Gait is cautious and narrow.    Thank you for allowing us  the opportunity to participate in the care of this nice patient. Please do not hesitate to contact us  for any questions or concerns.   Total time spent on today's visit was *** minutes dedicated to this patient today, preparing to see patient, examining the patient, ordering tests and/or medications and counseling the patient, documenting clinical information in the EHR or other health record, independently interpreting results and communicating results to the patient/family, discussing treatment and goals, answering patient's questions and coordinating care.  Cc:  Norleen Lynwood ORN, MD  Camie Sevin 07/07/2024 6:13 AM

## 2024-07-08 ENCOUNTER — Ambulatory Visit: Admitting: Physician Assistant

## 2024-07-08 ENCOUNTER — Encounter: Payer: Self-pay | Admitting: Physician Assistant

## 2024-07-08 VITALS — BP 118/65 | HR 90 | Ht 70.0 in | Wt 160.0 lb

## 2024-07-08 DIAGNOSIS — R269 Unspecified abnormalities of gait and mobility: Secondary | ICD-10-CM | POA: Diagnosis not present

## 2024-07-08 DIAGNOSIS — F0280 Dementia in other diseases classified elsewhere without behavioral disturbance: Secondary | ICD-10-CM

## 2024-07-08 DIAGNOSIS — G309 Alzheimer's disease, unspecified: Secondary | ICD-10-CM | POA: Diagnosis not present

## 2024-07-08 NOTE — Patient Instructions (Signed)
 It was a pleasure to see you today at our office.   Recommendations:    Follow up in 6 months Continue  memantine  10 mg twice daily.    Continue B12 daily  Recommend visiting the website :  Dementia Success Path to better understand some behaviors related to memory loss.  Referral to PT      [email or web address]/resource/neuro-rehabilitation-apps-[email or web address]-games/   RECOMMENDATIONS FOR ALL PATIENTS WITH MEMORY PROBLEMS: 1. Continue to exercise (Recommend 30 minutes of walking everyday, or 3 hours every week) 2. Increase social interactions - continue going to [place] and enjoy social gatherings with friends and family 3. Eat healthy, avoid fried foods and eat more fruits and vegetables 4. Maintain adequate blood pressure, blood sugar, and blood cholesterol level. Reducing the risk of stroke and cardiovascular disease also helps promoting better memory. 5. Avoid stressful situations. Live a simple life and avoid aggravations. Organize your time and prepare for the next day in anticipation. 6. Sleep well, avoid any interruptions of sleep and avoid any distractions in the bedroom that may interfere with adequate sleep quality 7. Avoid sugar, avoid sweets as there is a strong link between excessive sugar intake, diabetes, and cognitive impairment We discussed the Mediterranean diet, which has been shown to help patients reduce the risk of progressive memory disorders and reduces cardiovascular risk. This includes eating fish, eat fruits and green leafy vegetables, nuts like almonds and hazelnuts, walnuts, and also use olive oil. Avoid fast foods and fried foods as much as possible. Avoid sweets and sugar as sugar use has been linked to worsening of memory function.  There is always a concern of gradual progression of memory problems. If this is the case, then we may need to adjust level of care according to patient needs. Support, both to the patient and caregiver, should then be put into  place.      You have been referred for a neuropsychological evaluation (i.e., evaluation of memory and thinking abilities). Please bring someone with you to this appointment if possible, as it is helpful for the doctor to hear from both you and another adult who knows you well. Please bring eyeglasses and hearing aids if you wear them.    The evaluation will take approximately 3 hours and has two parts:   The first part is a clinical interview with the neuropsychologist (Dr. Richie or Dr. Gayland). During the interview, the neuropsychologist will speak with you and the individual you brought to the appointment.    The second part of the evaluation is testing with the doctor's technician Neal or Luke). During the testing, the technician will ask you to remember different types of material, solve problems, and answer some questionnaires. Your family member will not be present for this portion of the evaluation.   Please note: We must reserve several hours of the neuropsychologist's time and the psychometrician's time for your evaluation appointment. As such, there is a No-Show fee of $100. If you are unable to attend any of your appointments, please contact our office as soon as possible to reschedule.      DRIVING: Regarding driving, in patients with progressive memory problems, driving will be impaired. We advise to have someone else do the driving if trouble finding directions or if minor accidents are reported. Independent driving assessment is available to determine safety of driving.   If you are interested in the driving assessment, you can contact the following:  [employer] in [place] [phone]  [employer] [phone]  [facility] [phone]  [facility] [phone] or [phone]   FALL PRECAUTIONS: Be cautious when walking. Scan the area for obstacles that may increase the risk of trips and falls. When getting up in the  mornings, sit up at the edge of the bed for a few minutes before getting out of bed. Consider elevating the bed at the head end to avoid drop of blood pressure when getting up. Walk always in a well-lit room (use night lights in the walls). Avoid area rugs or power cords from appliances in the middle of the walkways. Use a walker or a cane if necessary and consider physical therapy for balance exercise. Get your eyesight checked regularly.  FINANCIAL OVERSIGHT: Supervision, especially oversight when making financial decisions or transactions is also recommended.  HOME SAFETY: Consider the safety of the kitchen when operating appliances like stoves, microwave oven, and blender. Consider having supervision and share cooking responsibilities until no longer able to participate in those. Accidents with firearms and other hazards in the house should be identified and addressed as well.   ABILITY TO BE LEFT ALONE: If patient is unable to contact 911 operator, consider using LifeLine, or when the need is there, arrange for someone to stay with patients. Smoking is a fire hazard, consider supervision or cessation. Risk of wandering should be assessed by caregiver and if detected at any point, supervision and safe proof recommendations should be instituted.  MEDICATION SUPERVISION: Inability to self-administer medication needs to be constantly addressed. Implement a mechanism to ensure safe administration of the medications.      Mediterranean Diet A Mediterranean diet refers to food and lifestyle choices that are based on the traditions of countries located on the [employer]. This way of eating has been shown to help prevent certain conditions and improve outcomes for people who have chronic diseases, like kidney disease and heart disease. What are tips for following this plan? Lifestyle  Cook and eat meals together with your family, when possible. Drink enough fluid to keep your urine clear or  pale yellow. Be physically active every day. This includes: Aerobic exercise like running or swimming. Leisure activities like gardening, walking, or housework. Get 7-8 hours of sleep each night. If recommended by your health care provider, drink red wine in moderation. This means 1 glass a day for nonpregnant women and 2 glasses a day for men. A glass of wine equals 5 oz (150 mL). Reading food labels  Check the serving size of packaged foods. For foods such as rice and pasta, the serving size refers to the amount of cooked product, not dry. Check the total fat in packaged foods. Avoid foods that have saturated fat or trans fats. Check the ingredients list for added sugars, such as corn syrup. Shopping  At the grocery store, buy most of your food from the areas near the walls of the store. This includes: Fresh fruits and vegetables (produce). Grains, beans, nuts, and seeds. Some of these may be available in unpackaged forms or large amounts (in bulk). Fresh seafood. Poultry and eggs. Low-fat dairy products. Buy whole ingredients instead of prepackaged foods. Buy fresh fruits and vegetables in-season from local farmers markets. Buy frozen fruits and vegetables in resealable bags. If you do not have access to quality fresh seafood, buy precooked frozen shrimp or canned fish, such as tuna, salmon, or sardines. Buy small amounts of raw or cooked vegetables, salads, or olives from the deli or salad bar at your store. Stock Water quality scientist  so you always have certain foods on hand, such as olive oil, canned tuna, canned tomatoes, rice, pasta, and beans. Cooking  Cook foods with extra-virgin olive oil instead of using butter or other vegetable oils. Have meat as a side dish, and have vegetables or grains as your main dish. This means having meat in small portions or adding small amounts of meat to foods like pasta or stew. Use beans or vegetables instead of meat in common dishes like chili or  lasagna. Experiment with different cooking methods. Try roasting or broiling vegetables instead of steaming or sauteing them. Add frozen vegetables to soups, stews, pasta, or rice. Add nuts or seeds for added healthy fat at each meal. You can add these to yogurt, salads, or vegetable dishes. Marinate fish or vegetables using olive oil, lemon juice, garlic, and fresh herbs. Meal planning  Plan to eat 1 vegetarian meal one day each week. Try to work up to 2 vegetarian meals, if possible. Eat seafood 2 or more times a week. Have healthy snacks readily available, such as: Vegetable sticks with hummus. Greek yogurt. Fruit and nut trail mix. Eat balanced meals throughout the week. This includes: Fruit: 2-3 servings a day Vegetables: 4-5 servings a day Low-fat dairy: 2 servings a day Fish, poultry, or lean meat: 1 serving a day Beans and legumes: 2 or more servings a week Nuts and seeds: 1-2 servings a day Whole grains: 6-8 servings a day Extra-virgin olive oil: 3-4 servings a day Limit red meat and sweets to only a few servings a month What are my food choices? Mediterranean diet Recommended Grains: Whole-grain pasta. Brown rice. Bulgar wheat. Polenta. Couscous. Whole-wheat bread. [place] Made[place]. Vegetables: Artichokes. Beets. Broccoli. Cabbage. Carrots. Eggplant. Green beans. Chard. Kale. Spinach. Onions. Leeks. Peas. Squash. Tomatoes. Peppers. Radishes. Fruits: Apples. Apricots. Avocado. Berries. Bananas. Cherries. Dates. Figs. Grapes. Lemons. Melon. Oranges. Peaches. Plums. Pomegranate. Meats and other protein foods: Beans. Almonds. Sunflower seeds. Pine nuts. Peanuts. Cod. Salmon. Scallops. Shrimp. Tuna. Tilapia. Clams. Oysters. Eggs. Dairy: Low-fat milk. Cheese. Greek yogurt. Beverages: Water. Red wine. Herbal tea. Fats and oils: Extra virgin olive oil. Avocado oil. Grape seed oil. Sweets and desserts: [place] yogurt with honey. Baked apples. Poached pears. Trail mix. Seasoning and  other foods: Basil. Cilantro. Coriander. Cumin. Mint. Parsley. Sage. Rosemary. Tarragon. Garlic. Oregano. Thyme. Pepper. Balsalmic vinegar. Tahini. Hummus. Tomato sauce. Olives. Mushrooms. Limit these Grains: Prepackaged pasta or rice dishes. Prepackaged cereal with added sugar. Vegetables: Deep fried potatoes (french fries). Fruits: Fruit canned in syrup. Meats and other protein foods: Beef. Pork. Lamb. Poultry with skin. Hot dogs. [place]. Dairy: Ice cream. Sour cream. Whole milk. Beverages: Juice. Sugar-sweetened soft drinks. Beer. Liquor and spirits. Fats and oils: Butter. Canola oil. Vegetable oil. Beef fat (tallow). Lard. Sweets and desserts: Cookies. Cakes. Pies. Candy. Seasoning and other foods: Mayonnaise. Premade sauces and marinades. The items listed may not be a complete list. Talk with your dietitian about what dietary choices are right for you. Summary The Mediterranean diet includes both food and lifestyle choices. Eat a variety of fresh fruits and vegetables, beans, nuts, seeds, and whole grains. Limit the amount of red meat and sweets that you eat. Talk with your health care provider about whether it is safe for you to drink red wine in moderation. This means 1 glass a day for nonpregnant women and 2 glasses a day for men. A glass of wine equals 5 oz (150 mL). This information is not intended to replace advice given to you  by your health care provider. Make sure you discuss any questions you have with your health care provider. Document Released: 04/25/2016 Document Revised: 05/28/2016 Document Reviewed: 04/25/2016 Elsevier Interactive Patient Education  2017 [employer].

## 2024-07-27 ENCOUNTER — Encounter: Payer: Self-pay | Admitting: Internal Medicine

## 2024-07-27 DIAGNOSIS — R7302 Impaired glucose tolerance (oral): Secondary | ICD-10-CM

## 2024-07-27 DIAGNOSIS — E611 Iron deficiency: Secondary | ICD-10-CM

## 2024-07-27 DIAGNOSIS — E538 Deficiency of other specified B group vitamins: Secondary | ICD-10-CM

## 2024-07-27 DIAGNOSIS — I10 Essential (primary) hypertension: Secondary | ICD-10-CM

## 2024-07-27 DIAGNOSIS — E559 Vitamin D deficiency, unspecified: Secondary | ICD-10-CM

## 2024-07-29 ENCOUNTER — Other Ambulatory Visit (INDEPENDENT_AMBULATORY_CARE_PROVIDER_SITE_OTHER)

## 2024-07-29 ENCOUNTER — Ambulatory Visit: Payer: Self-pay | Admitting: Internal Medicine

## 2024-07-29 ENCOUNTER — Ambulatory Visit (INDEPENDENT_AMBULATORY_CARE_PROVIDER_SITE_OTHER): Admitting: Internal Medicine

## 2024-07-29 ENCOUNTER — Encounter: Payer: Self-pay | Admitting: Internal Medicine

## 2024-07-29 VITALS — BP 122/66 | HR 65 | Temp 97.9°F | Ht 70.0 in | Wt 160.0 lb

## 2024-07-29 DIAGNOSIS — E785 Hyperlipidemia, unspecified: Secondary | ICD-10-CM

## 2024-07-29 DIAGNOSIS — E559 Vitamin D deficiency, unspecified: Secondary | ICD-10-CM

## 2024-07-29 DIAGNOSIS — R2681 Unsteadiness on feet: Secondary | ICD-10-CM

## 2024-07-29 DIAGNOSIS — E611 Iron deficiency: Secondary | ICD-10-CM | POA: Diagnosis not present

## 2024-07-29 DIAGNOSIS — I10 Essential (primary) hypertension: Secondary | ICD-10-CM | POA: Diagnosis not present

## 2024-07-29 DIAGNOSIS — Z23 Encounter for immunization: Secondary | ICD-10-CM | POA: Diagnosis not present

## 2024-07-29 DIAGNOSIS — E538 Deficiency of other specified B group vitamins: Secondary | ICD-10-CM

## 2024-07-29 DIAGNOSIS — R7302 Impaired glucose tolerance (oral): Secondary | ICD-10-CM | POA: Diagnosis not present

## 2024-07-29 DIAGNOSIS — Z0001 Encounter for general adult medical examination with abnormal findings: Secondary | ICD-10-CM | POA: Insufficient documentation

## 2024-07-29 DIAGNOSIS — Z0000 Encounter for general adult medical examination without abnormal findings: Secondary | ICD-10-CM | POA: Diagnosis not present

## 2024-07-29 LAB — BASIC METABOLIC PANEL WITH GFR
BUN: 26 mg/dL — ABNORMAL HIGH (ref 6–23)
CO2: 27 meq/L (ref 19–32)
Calcium: 9 mg/dL (ref 8.4–10.5)
Chloride: 102 meq/L (ref 96–112)
Creatinine, Ser: 1.11 mg/dL (ref 0.40–1.50)
GFR: 57.54 mL/min — ABNORMAL LOW (ref >60.00)
Glucose, Bld: 102 mg/dL — ABNORMAL HIGH (ref 70–99)
Potassium: 4.4 meq/L (ref 3.5–5.1)
Sodium: 138 meq/L (ref 135–145)

## 2024-07-29 LAB — URINALYSIS, ROUTINE W REFLEX MICROSCOPIC
Bilirubin Urine: NEGATIVE
Hgb urine dipstick: NEGATIVE
Ketones, ur: NEGATIVE
Leukocytes,Ua: NEGATIVE
Nitrite: NEGATIVE
RBC / HPF: NONE SEEN (ref 0–?)
Specific Gravity, Urine: 1.015 (ref 1.000–1.030)
Total Protein, Urine: NEGATIVE
Urine Glucose: NEGATIVE
Urobilinogen, UA: 0.2 (ref 0.0–1.0)
WBC, UA: NONE SEEN (ref 0–?)
pH: 5.5 (ref 5.0–8.0)

## 2024-07-29 LAB — CBC WITH DIFFERENTIAL/PLATELET
Basophils Absolute: 0 K/uL (ref 0.0–0.1)
Basophils Relative: 0.4 % (ref 0.0–3.0)
Eosinophils Absolute: 0.1 K/uL (ref 0.0–0.7)
Eosinophils Relative: 4.1 % (ref 0.0–5.0)
HCT: 32 % — ABNORMAL LOW (ref 39.0–52.0)
Hemoglobin: 11.2 g/dL — ABNORMAL LOW (ref 13.0–17.0)
Lymphocytes Relative: 18.1 % (ref 12.0–46.0)
Lymphs Abs: 0.6 K/uL — ABNORMAL LOW (ref 0.7–4.0)
MCHC: 34.9 g/dL (ref 30.0–36.0)
MCV: 110.5 fl — ABNORMAL HIGH (ref 78.0–100.0)
Monocytes Absolute: 0.5 K/uL (ref 0.1–1.0)
Monocytes Relative: 13.6 % — ABNORMAL HIGH (ref 3.0–12.0)
Neutro Abs: 2.2 K/uL (ref 1.4–7.7)
Neutrophils Relative %: 63.8 % (ref 43.0–77.0)
Platelets: 240 K/uL (ref 150.0–400.0)
RBC: 2.89 Mil/uL — ABNORMAL LOW (ref 4.22–5.81)
RDW: 13.9 % (ref 11.5–15.5)
WBC: 3.4 K/uL — ABNORMAL LOW (ref 4.0–10.5)

## 2024-07-29 LAB — IBC PANEL
Iron: 119 ug/dL (ref 42–165)
Saturation Ratios: 47.8 % (ref 20.0–50.0)
TIBC: 249.2 ug/dL — ABNORMAL LOW (ref 250.0–450.0)
Transferrin: 178 mg/dL — ABNORMAL LOW (ref 212.0–360.0)

## 2024-07-29 LAB — LIPID PANEL
Cholesterol: 116 mg/dL (ref 0–200)
HDL: 58.3 mg/dL (ref >39.00)
LDL Cholesterol: 47 mg/dL (ref 0–99)
NonHDL: 57.35
Total CHOL/HDL Ratio: 2
Triglycerides: 50 mg/dL (ref 0.0–149.0)
VLDL: 10 mg/dL (ref 0.0–40.0)

## 2024-07-29 LAB — HEPATIC FUNCTION PANEL
ALT: 25 U/L (ref 0–53)
AST: 26 U/L (ref 0–37)
Albumin: 3.9 g/dL (ref 3.5–5.2)
Alkaline Phosphatase: 60 U/L (ref 39–117)
Bilirubin, Direct: 0.2 mg/dL (ref 0.0–0.3)
Total Bilirubin: 1.2 mg/dL (ref 0.2–1.2)
Total Protein: 6.5 g/dL (ref 6.0–8.3)

## 2024-07-29 LAB — VITAMIN B12: Vitamin B-12: 265 pg/mL (ref 211–911)

## 2024-07-29 LAB — FERRITIN: Ferritin: 197.9 ng/mL (ref 22.0–322.0)

## 2024-07-29 LAB — TSH: TSH: 2.67 u[IU]/mL (ref 0.35–5.50)

## 2024-07-29 LAB — HEMOGLOBIN A1C: Hgb A1c MFr Bld: 6.1 % (ref 4.6–6.5)

## 2024-07-29 LAB — VITAMIN D 25 HYDROXY (VIT D DEFICIENCY, FRACTURES): VITD: 66.07 ng/mL (ref 30.00–100.00)

## 2024-07-29 MED ORDER — TELMISARTAN 20 MG PO TABS: 10.0000 mg | ORAL_TABLET | Freq: Every day | ORAL | 3 refills | Status: DC

## 2024-07-29 NOTE — Assessment & Plan Note (Signed)
 Lab Results  Component Value Date   LDLCALC 47 07/29/2024   Stable, pt to continue current statin lipitor 40 mg every day, also for lipoprotein a per daughter reqeust

## 2024-07-29 NOTE — Patient Instructions (Addendum)
 You had the flu shot today  Ok to take HALF of the Micardis  20 mg and continue to monitor to BP as you do  The lab testing is pending results, but as the Lipoprotein A test was not ordered, please go to the lab if you still want to do this.  Please continue all other medications as before, and refills have been done if requested.  Please have the pharmacy call with any other refills you may need.  Please continue your efforts at being more active, low cholesterol diet, and weight control.  You are otherwise up to date with prevention measures today.  Please keep your appointments with your specialists as you may have planned  Your lab work is pending.   Please make an Appointment to return in 6 months, or sooner if needed

## 2024-07-29 NOTE — Assessment & Plan Note (Addendum)
 Age and sex appropriate education and counseling updated with regular exercise and diet Referrals for preventative services - none needed Immunizations addressed - for shingrix at pharmacy, for flu shot today Smoking counseling  - none needed Evidence for depression or other mood disorder - none significant Most recent labs reviewed. I have personally reviewed and have noted: 1) the patient's medical and social history 2) The patient's current medications and supplements 3) The patient's height, weight, and BMI have been recorded in the chart

## 2024-07-29 NOTE — Assessment & Plan Note (Signed)
 Chronic stable, declines PT for now

## 2024-07-29 NOTE — Assessment & Plan Note (Signed)
 BP Readings from Last 3 Encounters:  07/29/24 122/66  07/08/24 118/65  06/04/24 (!) 144/70   Possible overcontrolled, pt to decrease micardis  20 mg to 10 mg qd

## 2024-07-29 NOTE — Progress Notes (Signed)
 Patient ID: Ronald [name], male   DOB: 1930/06/24, 89 y.o.   MRN: [id number]         Chief Complaint:: wellness exam and Annual Exam (Wants to get labwork , wants to see if he can come off some of his meds and want recommendations for a new PCP )  , hx of falls earlier this year, hld, htn, low b12       HPI:  Ronald [name] is a 89 y.o. male here for wellness exam; for shingrix at pharmacy, declines covid booster, for flu shot today o/w up to date                        Also with falls jan and July 2025. Due for flu shot.  Daughter concerned about low normal and risk of more falls.  Dementia overall stable symptomatically, and not assoc with behavioral changes such as hallucinations, paranoia, or agitation.  Pt denies chest pain, increased sob or doe, wheezing, orthopnea, PND, increased LE swelling, palpitations, dizziness or syncope.   Pt denies polydipsia, polyuria, or new focal neuro s/s.    Wt Readings from Last 3 Encounters:  07/29/24 160 lb (72.6 kg)  07/08/24 160 lb (72.6 kg)  06/04/24 161 lb 3.2 oz (73.1 kg)   BP Readings from Last 3 Encounters:  07/29/24 122/66  07/08/24 118/65  06/04/24 (!) 144/70   Immunization History  Administered Date(s) Administered   Fluad Quad(high Dose 65+) 05/14/2019, 05/29/2021, 05/30/2022   Fluad Trivalent(High Dose 65+) 05/30/2023   H1N1 06/16/2008   INFLUENZA, HIGH DOSE SEASONAL PF 06/23/2017, 06/16/2018, 08/05/2018, 07/29/2024   Influenza Split 05/28/2013   Influenza,inj,Quad PF,6+ Mos 06/21/2014, 05/07/2016   Influenza-Unspecified 06/15/2015   Moderna Sars-Covid-2 Vaccination 11/09/2019, 11/30/2019   PFIZER(Purple Top)SARS-COV-2 Vaccination 06/10/2020, 02/28/2021, 07/19/2021   Pneumococcal Conjugate-13 04/28/2014   Pneumococcal Polysaccharide-23 09/16/2004, 04/16/2011   Td 01/04/2009   Tdap 05/14/2019   Zoster, Live 08/17/2013   Health Maintenance Due  Topic Date Due   Zoster Vaccines- Shingrix (1 of 2) 10/21/1981   Medicare Annual  Wellness (AWV)  07/19/2023   COVID-19 Vaccine (6 - 2025-26 season) 05/17/2024      Past Medical History:  Diagnosis Date   ALLERGIC RHINITIS 11/25/2007   ANXIETY 11/25/2007   COLONIC POLYPS, HX OF 11/25/2007   CORONARY ARTERY DISEASE 11/25/2007   Cough 01/04/2009   HEMORRHOIDS, INTERNAL 11/06/2010   HOARSENESS 01/04/2009   HYPERLIPIDEMIA 11/25/2007   HYPERTENSION 11/25/2007   Impaired glucose tolerance 04/21/2013   LOW BACK PAIN 11/25/2007   Lumbar radiculopathy, chronic    with right foot drop   PEPTIC ULCER DISEASE 11/25/2007   Peroneal nerve lesion of lower extremity, 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 He has never used smokeless tobacco. He reports that he does not currently use alcohol. He reports that he does not use drugs. family history includes Colon cancer in his father. No Known Allergies Current Outpatient Medications on File Prior to Visit  Medication Sig Dispense Refill   aspirin  81 MG tablet Take 81 mg  by mouth daily.     atorvastatin  (LIPITOR) 40 MG tablet TAKE 1 TABLET BY MOUTH ONCE DAILY IN THE EVENING 90 tablet 0   b complex vitamins tablet Take 1 tablet by mouth daily.     Cholecalciferol  (VITAMIN D ) 2000 UNITS CAPS Take 2,000 Units by mouth daily.     citalopram  (CELEXA ) 20 MG tablet Take 1 tablet (20 mg total) by mouth daily. 90 tablet 3   GEMTESA 75 MG TABS Take 1 tablet (75 mg total) by mouth every evening. 90 tablet 3   memantine  (NAMENDA ) 10 MG tablet Take 1 tablet (10 mg total) by mouth 2 (two) times daily. 60 tablet 11   triamcinolone  (NASACORT ) 55 MCG/ACT AERO nasal inhaler Place 2 sprays into the nose daily. (Patient not  taking: Reported on 07/08/2024) 1 each 12   No current facility-administered medications on file prior to visit.        ROS:  All others reviewed and negative.  Objective        PE:  BP 122/66 (BP Location: Right Arm, Patient Position: Sitting, Cuff Size: Normal)   Pulse 65   Temp 97.9 F (36.6 C) (Oral)   Ht 5' 10 (1.778 m)   Wt 160 lb (72.6 kg)   SpO2 94%   BMI 22.96 kg/m                 Constitutional: Pt appears in NAD               HENT: Head: NCAT.                Right Ear: External ear normal.                 Left Ear: External ear normal.                Eyes: . Pupils are equal, round, and reactive to light. Conjunctivae and EOM are normal               Nose: without d/c or deformity               Neck: Neck supple. Gross normal ROM               Cardiovascular: Normal rate and regular rhythm.                 Pulmonary/Chest: Effort normal and breath sounds without rales or wheezing.                Abd:  Soft, NT, ND, + BS, no organomegaly               Neurological: Pt is alert. At baseline orientation, motor grossly intact               Skin: Skin is warm. No rashes, no other new lesions, LE edema - none               Psychiatric: Pt behavior is normal without agitation   Micro: none  Cardiac tracings I have personally interpreted today:  none  Pertinent Radiological findings (summarize): none   Lab Results  Component Value Date   WBC 3.4 (L) 07/29/2024   HGB 11.2 (L) 07/29/2024   HCT 32.0 (L) 07/29/2024   PLT 240.0 07/29/2024   GLUCOSE 102 (H) 07/29/2024   CHOL 116 07/29/2024   TRIG 50.0 07/29/2024   HDL 58.30 07/29/2024   LDLCALC 47 07/29/2024   ALT 25 07/29/2024   AST  26 07/29/2024   NA 138 07/29/2024   K 4.4 07/29/2024   CL 102 07/29/2024   CREATININE 1.11 07/29/2024   BUN 26 (H) 07/29/2024   CO2 27 07/29/2024   TSH 2.67 07/29/2024   PSA 2.00 01/25/2010   HGBA1C 6.1 07/29/2024   Assessment/Plan:  Ronald [name] is a 89 y.o. 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  Encounter for well adult exam with abnormal findings Age and sex appropriate education and counseling updated with regular exercise and diet Referrals for preventative services - none needed Immunizations addressed - for shingrix at pharmacy, for flu shot today Smoking counseling  - none needed Evidence for depression or other mood disorder - none significant Most recent labs reviewed. I have personally reviewed and have noted: 1) the patient's medical and social history 2) The patient's current medications and supplements 3) The patient's height, weight, and BMI have been recorded in the chart   Gait instability Chronic stable, declines PT for now  Essential hypertension BP Readings from Last 3 Encounters:  07/29/24 122/66  07/08/24 118/65  06/04/24 (!) 144/70   Possible overcontrolled, pt to decrease micardis  20 mg to 10 mg qd   Elevated lipids Lab Results  Component Value Date   LDLCALC 47 07/29/2024   Stable, pt to continue current statin lipitor 40 mg every day, also for lipoprotein a per daughter reqeust   B12 deficiency Lab Results  Component Value Date   VITAMINB12 265 07/29/2024   Low, to start oral replacement - b12 1000 mcg qd  Followup: Return in about 6 months (around 01/26/2025).  Lynwood Norleen, MD 07/29/2024 6:45 PM [facility]

## 2024-07-29 NOTE — Assessment & Plan Note (Signed)
 Lab Results  Component Value Date   VITAMINB12 265 07/29/2024   Low, to start oral replacement - b12 1000 mcg qd

## 2024-08-02 ENCOUNTER — Ambulatory Visit (INDEPENDENT_AMBULATORY_CARE_PROVIDER_SITE_OTHER): Admitting: Internal Medicine

## 2024-08-02 ENCOUNTER — Ambulatory Visit: Payer: Self-pay

## 2024-08-02 ENCOUNTER — Encounter: Payer: Self-pay | Admitting: Internal Medicine

## 2024-08-02 VITALS — BP 138/68 | HR 95 | Temp 97.7°F | Ht 70.0 in | Wt 161.0 lb

## 2024-08-02 DIAGNOSIS — I10 Essential (primary) hypertension: Secondary | ICD-10-CM

## 2024-08-02 DIAGNOSIS — N368 Other specified disorders of urethra: Secondary | ICD-10-CM | POA: Insufficient documentation

## 2024-08-02 DIAGNOSIS — E538 Deficiency of other specified B group vitamins: Secondary | ICD-10-CM | POA: Diagnosis not present

## 2024-08-02 DIAGNOSIS — G250 Essential tremor: Secondary | ICD-10-CM | POA: Diagnosis not present

## 2024-08-02 MED ORDER — TELMISARTAN 20 MG PO TABS: 10.0000 mg | ORAL_TABLET | Freq: Every day | ORAL | 3 refills | Status: DC

## 2024-08-02 MED ORDER — METOPROLOL SUCCINATE ER 50 MG PO TB24: 50.0000 mg | ORAL_TABLET | Freq: Every day | ORAL | 3 refills | Status: DC

## 2024-08-02 NOTE — Patient Instructions (Signed)
 Ok to take HALF of the telmisartan    Please take all new medication as prescribed - the metoprolol  xl 50 mg per day  Please see Pharmacy for [facility] coagulation powder or similar  Please continue all other medications as before, and refills have been done if requested.  Please have the pharmacy call with any other refills you may need.  Please keep your appointments with your specialists as you may have planned

## 2024-08-02 NOTE — Assessment & Plan Note (Signed)
 Lab Results  Component Value Date   VITAMINB12 265 07/29/2024   Low, to start oral replacement - b12 1000 mcg qd

## 2024-08-02 NOTE — Assessment & Plan Note (Signed)
 Worsening it seems, has tolerated BB before - ok to add toprolx l 50 every day, consider neuro f/u for mysoline  if not effective

## 2024-08-02 NOTE — Progress Notes (Signed)
 Patient ID: Ronald [name], male   DOB: August 14, 1930, 89 y.o.   MRN: [id number]        Chief Complaint: follow up glans penis bleeding, tremors, htn, low b12       HPI:  Ronald [name] is a 89 y.o. male here with daughter.  Pt was up at his bedside at home early this am using the urinal so he did not have to go to the BR.  Unfortunately his tremors have been worsening, and he accidentally struck the penile meatus on the plastic edge of the opening to the urinal.  Suffered a small laceration, they thought was done bleeding after, but has bled through 2 gauze pads over the day today.  Pt states at one point with no overt bleeding, his urine appeared typical color.  Also has neurology appt next April to follow up.         Wt Readings from Last 3 Encounters:  08/02/24 161 lb (73 kg)  07/29/24 160 lb (72.6 kg)  07/08/24 160 lb (72.6 kg)   BP Readings from Last 3 Encounters:  08/02/24 138/68  07/29/24 122/66  07/08/24 118/65         Past Medical History:  Diagnosis Date   ALLERGIC RHINITIS 11/25/2007   ANXIETY 11/25/2007   COLONIC POLYPS, HX OF 11/25/2007   CORONARY ARTERY DISEASE 11/25/2007   Cough 01/04/2009   HEMORRHOIDS, INTERNAL 11/06/2010   HOARSENESS 01/04/2009   HYPERLIPIDEMIA 11/25/2007   HYPERTENSION 11/25/2007   Impaired glucose tolerance 04/21/2013   LOW BACK PAIN 11/25/2007   Lumbar radiculopathy, chronic    with right foot drop   PEPTIC ULCER DISEASE 11/25/2007   Peroneal nerve lesion of lower extremity, 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 He has never used smokeless tobacco. He reports that he does not currently use alcohol. He reports that he does not  use drugs. family history includes Colon cancer in his father. No Known Allergies Current Outpatient Medications on File Prior to Visit  Medication Sig Dispense Refill   aspirin  81 MG tablet Take 81 mg by mouth daily.     atorvastatin  (LIPITOR) 40 MG tablet TAKE 1 TABLET BY MOUTH ONCE DAILY IN THE EVENING 90 tablet 0   b complex vitamins tablet Take 1 tablet by mouth daily.     Cholecalciferol  (VITAMIN D ) 2000 UNITS CAPS Take 2,000 Units by mouth daily.     citalopram  (CELEXA ) 20 MG tablet Take 1 tablet (20 mg total) by mouth daily. 90 tablet 3   GEMTESA 75 MG TABS Take 1 tablet (75 mg total) by mouth every evening. 90 tablet 3   memantine  (NAMENDA ) 10 MG tablet Take 1 tablet (10 mg total) by mouth 2 (two) times daily. 60 tablet 11   triamcinolone  (NASACORT ) 55 MCG/ACT AERO nasal inhaler Place 2 sprays into the nose daily. (Patient not taking: Reported on 08/02/2024) 1 each 12   No current facility-administered medications on file prior to visit.        ROS:  All others reviewed and negative.  Objective  PE:  BP 138/68   Pulse 95   Temp 97.7 F (36.5 C) (Temporal)   Ht 5' 10 (1.778 m)   Wt 161 lb (73 kg)   SpO2 99%   BMI 23.10 kg/m                 Constitutional: Pt appears in NAD               HENT: Head: NCAT.                Right Ear: External ear normal.                 Left Ear: External ear normal.                Eyes: . Pupils are equal, round, and reactive to light. Conjunctivae and EOM are normal               Nose: without d/c or deformity               Neck: Neck supple. Gross normal ROM               Cardiovascular: Normal rate and regular rhythm.                 Pulmonary/Chest: Effort normal and breath sounds without rales or wheezing.                Abd:  Soft, NT, ND, + BS, no organomegaly GU:  penile meatus with approx 10 mm area blood oozing laceration very shallow, located at the lowest edge of the meatus               Neurological: Pt is alert. At  baseline orientation, motor grossly intact, bilat hand tremors severe               Skin: Skin is warm. LE edema - none               Psychiatric: Pt behavior is normal without agitation   Micro: none  Cardiac tracings I have personally interpreted today:  none  Pertinent Radiological findings (summarize): none   Lab Results  Component Value Date   WBC 3.4 (L) 07/29/2024   HGB 11.2 (L) 07/29/2024   HCT 32.0 (L) 07/29/2024   PLT 240.0 07/29/2024   GLUCOSE 102 (H) 07/29/2024   CHOL 116 07/29/2024   TRIG 50.0 07/29/2024   HDL 58.30 07/29/2024   LDLCALC 47 07/29/2024   ALT 25 07/29/2024   AST 26 07/29/2024   NA 138 07/29/2024   K 4.4 07/29/2024   CL 102 07/29/2024   CREATININE 1.11 07/29/2024   BUN 26 (H) 07/29/2024   CO2 27 07/29/2024   TSH 2.67 07/29/2024   PSA 2.00 01/25/2010   HGBA1C 6.1 07/29/2024   Assessment/Plan:  Ronald [name] is a 89 y.o. White or Caucasian [1] male with  has a past medical history of ALLERGIC RHINITIS (11/25/2007), ANXIETY (11/25/2007), COLONIC POLYPS, HX OF (11/25/2007), CORONARY ARTERY DISEASE (11/25/2007), Cough (01/04/2009), HEMORRHOIDS, INTERNAL (11/06/2010), HOARSENESS (01/04/2009), HYPERLIPIDEMIA (11/25/2007), HYPERTENSION (11/25/2007), Impaired glucose tolerance (04/21/2013), LOW BACK PAIN (11/25/2007), Lumbar radiculopathy, chronic, PEPTIC ULCER DISEASE (11/25/2007), Peroneal nerve lesion of lower extremity, bilateral (05/08/2017), Preventative health care (04/07/2011), PSA, INCREASED (01/05/2009), Right foot drop, Spasmodic dysphonia, and TREMOR, ESSENTIAL (11/25/2007).  Irritation of urethral meatus Pt has very mild but intermittent oozing of very small approx 10 mm x 2 mm laceration to lower aspect of the penile  meatus; improved with direct pressure, but seems to readily start again - I advised OTC Bleedstop at [facility] as a coagulation powder to stop the oozing.    Essential hypertension Due to addition of BB, will ask pt to take Half of his 20 mg micardis   to avoid overcontrol and falling  B12 deficiency Lab Results  Component Value Date   VITAMINB12 265 07/29/2024   Low, to start oral replacement - b12 1000 mcg qd   Essential tremor Worsening it seems, has tolerated BB before - ok to add toprolx l 50 every day, consider neuro f/u for mysoline  if not effective  Followup: Return if symptoms worsen or fail to improve.  Lynwood Rush, MD 08/02/2024 7:30 PM [facility] - [facility]

## 2024-08-02 NOTE — Assessment & Plan Note (Signed)
 Due to addition of BB, will ask pt to take Half of his 20 mg micardis  to avoid overcontrol and falling

## 2024-08-02 NOTE — Telephone Encounter (Signed)
 FYI Only or Action Required?: Action required by provider: request for appointment.  Patient was last seen in primary care on 07/29/2024 by Norleen Lynwood ORN, MD.  Called Nurse Triage reporting Abrasion.  Symptoms began yesterday.  Interventions attempted: Rest, hydration, or home remedies.  Symptoms are: unchanged.Pt. Using new urinal which has caused skin injury, oozing blood.  Triage Disposition: See HCP Within 4 Hours (Or PCP Triage)  Patient/caregiver understands and will follow disposition?:      Copied from CRM #[id number]. Topic: Clinical - Red Word Triage >> Aug 02, 2024 10:08 AM Cleave MATSU wrote: Red Word that prompted transfer to Nurse Triage: I am speaking with rudell and she stated pt has cut his private area on a plastic urinating bottle Answer Assessment - Initial Assessment Questions 1. APPEARANCE What does the injury look like?      abrasion 2. ONSET: How long ago did the injury occur?      yesterday 3. LOCATION: Where is the injury located?      penis 4. SIZE: How large is the cut?      unsure 5. BLEEDING: Is it bleeding now? If Yes, ask: Is it difficult to stop?      oozing 6. PAIN: Is there any pain? If Yes, ask: How bad is the pain? (Scale 0-10; or none, mild, moderate, severe)     no 7. MECHANISM: Tell me how it happened.      Using urinal 8. TETANUS: When was your last tetanus booster?     unsure 9. PREGNANCY: Is there any chance you are pregnant? When was your last menstrual period?     N/a  Protocols used: Skin Injury-A-AH  Reason for Disposition  [1] SEVERE pain (e.g., excruciating) AND [2] not improved 2 hours after pain medicine/ice packs  Answer Assessment - Initial Assessment Questions 1. APPEARANCE What does the injury look like?      abrasion 2. ONSET: How long ago did the injury occur?      yesterday 3. LOCATION: Where is the injury located?      penis 4. SIZE: How large is the cut?      unsure 5.  BLEEDING: Is it bleeding now? If Yes, ask: Is it difficult to stop?      oozing 6. PAIN: Is there any pain? If Yes, ask: How bad is the pain? (Scale 0-10; or none, mild, moderate, severe)     no 7. MECHANISM: Tell me how it happened.      Using urinal 8. TETANUS: When was your last tetanus booster?     unsure 9. PREGNANCY: Is there any chance you are pregnant? When was your last menstrual period?     N/a  Protocols used: Skin Injury-A-AH

## 2024-08-02 NOTE — Assessment & Plan Note (Signed)
 Pt has very mild but intermittent oozing of very small approx 10 mm x 2 mm laceration to lower aspect of the penile meatus; improved with direct pressure, but seems to readily start again - I advised OTC Bleedstop at [facility] as a coagulation powder to stop the oozing.

## 2024-08-03 ENCOUNTER — Telehealth: Payer: Self-pay | Admitting: Physician Assistant

## 2024-08-03 ENCOUNTER — Other Ambulatory Visit: Payer: Self-pay | Admitting: Physician Assistant

## 2024-08-03 MED ORDER — PRIMIDONE 50 MG PO TABS: ORAL_TABLET | ORAL | 11 refills | Status: AC

## 2024-08-03 NOTE — Telephone Encounter (Signed)
 I advised Rx was sent to pharmacy. She wife is POA, thanked me for calling.

## 2024-08-03 NOTE — Telephone Encounter (Signed)
 Who's calling (name and relationship to patient) : Tyrian Peart: P.o.A  Best contact number: [phone]  Provider they see: Dina, PA-C  Reason for call: Dagoberto Lvm stating that he had an appt about a month a ago and should have asked about medication for Essential Tremors. His Essential Tremors has worsened, and PCP suggested that he get a Rx for Mysoline (?) through Neurology office.

## 2024-08-06 LAB — LIPOPROTEIN A (LPA): Lipoprotein (a): <10 nmol/L (ref <75)

## 2024-08-06 LAB — EXTRA LAV TOP TUBE

## 2024-08-16 DIAGNOSIS — R410 Disorientation, unspecified: Secondary | ICD-10-CM | POA: Insufficient documentation

## 2024-08-24 ENCOUNTER — Other Ambulatory Visit: Payer: Self-pay

## 2024-08-24 ENCOUNTER — Other Ambulatory Visit: Payer: Self-pay | Admitting: Internal Medicine

## 2024-09-01 ENCOUNTER — Other Ambulatory Visit: Payer: Self-pay | Admitting: Internal Medicine

## 2024-09-01 ENCOUNTER — Other Ambulatory Visit: Payer: Self-pay

## 2024-09-19 ENCOUNTER — Other Ambulatory Visit: Payer: Self-pay | Admitting: Internal Medicine

## 2024-09-20 ENCOUNTER — Other Ambulatory Visit: Payer: Self-pay

## 2024-10-12 ENCOUNTER — Ambulatory Visit: Payer: Self-pay

## 2024-10-12 NOTE — Telephone Encounter (Signed)
 FYI Only or Action Required?: Action required by provider: request for appointment.  Patient was last seen in primary care on 08/02/2024 by Norleen Lynwood ORN, MD.  Called Nurse Triage reporting Altered Mental Status.  Symptoms began several days ago.  Interventions attempted: Nothing.  Symptoms are: unchanged.Daughter reports over the weekend pt. Has been confused, mild hallucinations. Sleeping a lot. She is not with pt. Caregivers are in the home. Wife currently on hospice care and not doing well. States this is new for pt. O2 sat %93, pulse 63, BP good. Refused ED. We won't take him out of here today because of his wife. Appointment made, daughter backed up, maybe it's coming on awhile. Instructed to call EMS for any changes.Please advise .Notified CAL.  Triage Disposition: Go to ED Now (or PCP Triage)  Patient/caregiver understands and will follow disposition?: No, wishes to speak with PCP        Reason for Triage: really confused last few days, unsure where he is at, seeing things that are not, saying things that do not make sense. Acting very loopy. Did pulse ox oxygen was 93%, heart rate 63. daughter is very worried as he was doing very well and all of a sudden he has declined.  Reason for Disposition  Patient sounds very sick or weak to the triager  Answer Assessment - Initial Assessment Questions 1. LEVEL OF CONSCIOUSNESS: How are they (the patient) acting right now? (e.g., alert-oriented, confused, lethargic, stuporous, comatose)     Alert, but groggy, sleeping a lot 2. ONSET: When did the confusion start?  (e.g., minutes, hours, days)     2-3 days ago 3. PATTERN: Does this come and go, or has it been constant since it started?  Is it present now?     Present now 4. ALCOHOL or DRUGS: Have they been drinking alcohol or taking any drugs?      no 5. NARCOTIC MEDICINES: Have they been receiving any narcotic medications? (e.g., morphine, Vicodin)     no 6.  CAUSE: What do you think is causing the confusion?      unsure 7. OTHER SYMPTOMS: Are there any other symptoms? (e.g., difficulty breathing, fever, headache, weakness)     Maybe some hallucinations  Protocols used: Confusion - Delirium-A-AH

## 2024-10-14 ENCOUNTER — Ambulatory Visit: Payer: Self-pay | Admitting: Internal Medicine

## 2024-10-14 ENCOUNTER — Ambulatory Visit (INDEPENDENT_AMBULATORY_CARE_PROVIDER_SITE_OTHER): Admitting: Internal Medicine

## 2024-10-14 ENCOUNTER — Encounter: Payer: Self-pay | Admitting: Internal Medicine

## 2024-10-14 VITALS — BP 126/78 | HR 77 | Temp 98.6°F | Ht 70.0 in | Wt 171.0 lb

## 2024-10-14 DIAGNOSIS — R410 Disorientation, unspecified: Secondary | ICD-10-CM

## 2024-10-14 DIAGNOSIS — I10 Essential (primary) hypertension: Secondary | ICD-10-CM | POA: Diagnosis not present

## 2024-10-14 DIAGNOSIS — E538 Deficiency of other specified B group vitamins: Secondary | ICD-10-CM

## 2024-10-14 DIAGNOSIS — H6122 Impacted cerumen, left ear: Secondary | ICD-10-CM

## 2024-10-14 LAB — BASIC METABOLIC PANEL WITH GFR
BUN: 35 mg/dL — ABNORMAL HIGH (ref 6–23)
CO2: 28 meq/L (ref 19–32)
Calcium: 8.9 mg/dL (ref 8.4–10.5)
Chloride: 100 meq/L (ref 96–112)
Creatinine, Ser: 1.05 mg/dL (ref 0.40–1.50)
GFR: 61.42 mL/min
Glucose, Bld: 108 mg/dL — ABNORMAL HIGH (ref 70–99)
Potassium: 4.5 meq/L (ref 3.5–5.1)
Sodium: 134 meq/L — ABNORMAL LOW (ref 135–145)

## 2024-10-14 LAB — URINALYSIS, ROUTINE W REFLEX MICROSCOPIC
Bilirubin Urine: NEGATIVE
Hgb urine dipstick: NEGATIVE
Ketones, ur: NEGATIVE
Leukocytes,Ua: NEGATIVE
Nitrite: NEGATIVE
Specific Gravity, Urine: 1.015 (ref 1.000–1.030)
Total Protein, Urine: NEGATIVE
Urine Glucose: NEGATIVE
Urobilinogen, UA: 0.2 (ref 0.0–1.0)
pH: 5.5 (ref 5.0–8.0)

## 2024-10-14 LAB — CBC WITH DIFFERENTIAL/PLATELET
Basophils Absolute: 0 10*3/uL (ref 0.0–0.1)
Basophils Relative: 0.4 % (ref 0.0–3.0)
Eosinophils Absolute: 0.2 10*3/uL (ref 0.0–0.7)
Eosinophils Relative: 3.3 % (ref 0.0–5.0)
HCT: 30.3 % — ABNORMAL LOW (ref 39.0–52.0)
Hemoglobin: 10.4 g/dL — ABNORMAL LOW (ref 13.0–17.0)
Lymphocytes Relative: 17.2 % (ref 12.0–46.0)
Lymphs Abs: 0.8 10*3/uL (ref 0.7–4.0)
MCHC: 34.5 g/dL (ref 30.0–36.0)
MCV: 111.3 fl — ABNORMAL HIGH (ref 78.0–100.0)
Monocytes Absolute: 0.8 10*3/uL (ref 0.1–1.0)
Monocytes Relative: 16.7 % — ABNORMAL HIGH (ref 3.0–12.0)
Neutro Abs: 3 10*3/uL (ref 1.4–7.7)
Neutrophils Relative %: 62.4 % (ref 43.0–77.0)
Platelets: 213 10*3/uL (ref 150.0–400.0)
RBC: 2.73 Mil/uL — ABNORMAL LOW (ref 4.22–5.81)
RDW: 14.1 % (ref 11.5–15.5)
WBC: 4.8 10*3/uL (ref 4.0–10.5)

## 2024-10-14 LAB — VITAMIN B12: Vitamin B-12: 410 pg/mL (ref 211–911)

## 2024-10-14 LAB — HEPATIC FUNCTION PANEL
ALT: 23 U/L (ref 3–53)
AST: 26 U/L (ref 5–37)
Albumin: 3.7 g/dL (ref 3.5–5.2)
Alkaline Phosphatase: 66 U/L (ref 39–117)
Bilirubin, Direct: 0.1 mg/dL (ref 0.1–0.3)
Total Bilirubin: 0.5 mg/dL (ref 0.2–1.2)
Total Protein: 6.7 g/dL (ref 6.0–8.3)

## 2024-10-14 MED ORDER — ATORVASTATIN CALCIUM 40 MG PO TABS: 40.0000 mg | ORAL_TABLET | Freq: Every evening | ORAL | 0 refills | Status: AC

## 2024-10-14 MED ORDER — CITALOPRAM HYDROBROMIDE 20 MG PO TABS: 20.0000 mg | ORAL_TABLET | Freq: Every day | ORAL | 0 refills | Status: AC

## 2024-10-14 MED ORDER — GEMTESA 75 MG PO TABS: 75.0000 mg | ORAL_TABLET | Freq: Every evening | ORAL | 3 refills | Status: AC

## 2024-10-14 MED ORDER — MEMANTINE HCL 10 MG PO TABS: 10.0000 mg | ORAL_TABLET | Freq: Two times a day (BID) | ORAL | 0 refills | Status: AC

## 2024-10-14 MED ORDER — TELMISARTAN 20 MG PO TABS: 10.0000 mg | ORAL_TABLET | Freq: Every day | ORAL | 3 refills | Status: AC

## 2024-10-14 MED ORDER — METOPROLOL SUCCINATE ER 50 MG PO TB24: 50.0000 mg | ORAL_TABLET | Freq: Every day | ORAL | 3 refills | Status: AC

## 2024-10-14 NOTE — Assessment & Plan Note (Signed)
 Mild to mod, resolved with irrigation

## 2024-10-14 NOTE — Assessment & Plan Note (Signed)
 BP Readings from Last 3 Encounters:  10/14/24 126/78  08/02/24 138/68  07/29/24 122/66   Stable, pt to continue medical treatment toprol  xl 50 every day, micardis  10 qd

## 2024-10-14 NOTE — Progress Notes (Signed)
 Patient ID: Ronald [name], male   DOB: 1930/01/26, 89 y.o.   MRN: [id number]        Chief Complaint: follow up dementia worsening confusion, low b12, left cerumen impaction, htn       HPI:  Ronald [name] is a 89 y.o. male here with daughter who reports mild worsening confusion and memory, getting days and nights mixed up.  Has been seeing neurology with tremor and memory loss, but she is concerned that may be other secondary cause.   Pt denies fever, wt loss, night sweats, loss of appetite, or other constitutional symptoms  Pt denies chest pain, increased sob or doe, wheezing, orthopnea, PND, increased LE swelling, palpitations, dizziness or syncope.   Pt denies polydipsia, polyuria, or new focal neuro s/s.   No recent falls or trauma.  No fever or worsening urinary symptoms or cough. Does have reduced hearing in left ear despite new hearing aids  Tremor much improved with primidone .        Wt Readings from Last 3 Encounters:  10/14/24 171 lb (77.6 kg)  08/02/24 161 lb (73 kg)  07/29/24 160 lb (72.6 kg)   BP Readings from Last 3 Encounters:  10/14/24 126/78  08/02/24 138/68  07/29/24 122/66         Past Medical History:  Diagnosis Date   ALLERGIC RHINITIS 11/25/2007   ANXIETY 11/25/2007   COLONIC POLYPS, HX OF 11/25/2007   CORONARY ARTERY DISEASE 11/25/2007   Cough 01/04/2009   HEMORRHOIDS, INTERNAL 11/06/2010   HOARSENESS 01/04/2009   HYPERLIPIDEMIA 11/25/2007   HYPERTENSION 11/25/2007   Impaired glucose tolerance 04/21/2013   LOW BACK PAIN 11/25/2007   Lumbar radiculopathy, chronic    with right foot drop   PEPTIC ULCER DISEASE 11/25/2007   Peroneal nerve lesion of lower extremity, 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 He has never used smokeless tobacco. He reports that he does not currently use alcohol. He reports that he does not use drugs. family history includes Colon cancer in his father. Allergies[1] Medications Ordered Prior to Encounter[2]      ROS:  All others reviewed and negative.  Objective        PE:  BP 126/78 (BP Location: Right Arm, Patient Position: Sitting, Cuff Size: Normal)   Pulse 77   Temp 98.6 F (37 C) (Oral)   Ht 5' 10 (1.778 m)   Wt 171 lb (77.6 kg)   SpO2 95%   BMI 24.54 kg/m                 Constitutional: Pt appears in NAD               HENT: Head: NCAT.                Right Ear: External ear normal.                 Left Ear: External ear normal. Left cerumen impaction resolved               Eyes: . Pupils are equal,  round, and reactive to light. Conjunctivae and EOM are normal               Nose: without d/c or deformity               Neck: Neck supple. Gross normal ROM               Cardiovascular: Normal rate and regular rhythm.                 Pulmonary/Chest: Effort normal and breath sounds without rales or wheezing.                Abd:  Soft, NT, ND, + BS, no organomegaly               Neurological: Pt is alert. At baseline orientation, motor grossly intact               Skin: Skin is warm. No rashes, no other new lesions, LE edema - none               Psychiatric: Pt behavior is normal without agitation   Micro: none  Cardiac tracings I have personally interpreted today:  none  Pertinent Radiological findings (summarize): none   Lab Results  Component Value Date   WBC 4.8 10/14/2024   HGB 10.4 (L) 10/14/2024   HCT 30.3 (L) 10/14/2024   PLT 213.0 10/14/2024   GLUCOSE 108 (H) 10/14/2024   CHOL 116 07/29/2024   TRIG 50.0 07/29/2024   HDL 58.30 07/29/2024   LDLCALC 47 07/29/2024   ALT 23 10/14/2024   AST 26 10/14/2024   NA 134 (L) 10/14/2024   K 4.5 10/14/2024   CL 100 10/14/2024    CREATININE 1.05 10/14/2024   BUN 35 (H) 10/14/2024   CO2 28 10/14/2024   TSH 2.67 07/29/2024   PSA 2.00 01/25/2010   HGBA1C 6.1 07/29/2024   Assessment/Plan:  Ronald [name] is a 89 y.o. White or Caucasian [1] male with  has a past medical history of ALLERGIC RHINITIS (11/25/2007), ANXIETY (11/25/2007), COLONIC POLYPS, HX OF (11/25/2007), CORONARY ARTERY DISEASE (11/25/2007), Cough (01/04/2009), HEMORRHOIDS, INTERNAL (11/06/2010), HOARSENESS (01/04/2009), HYPERLIPIDEMIA (11/25/2007), HYPERTENSION (11/25/2007), Impaired glucose tolerance (04/21/2013), LOW BACK PAIN (11/25/2007), Lumbar radiculopathy, chronic, PEPTIC ULCER DISEASE (11/25/2007), Peroneal nerve lesion of lower extremity, bilateral (05/08/2017), Preventative health care (04/07/2011), PSA, INCREASED (01/05/2009), Right foot drop, Spasmodic dysphonia, and TREMOR, ESSENTIAL (11/25/2007).  B12 deficiency Lab Results  Component Value Date   VITAMINB12 410 10/14/2024   Stable, cont oral replacement - b12 1000 mcg qd   Confusion and disorientation I suspect mild worsening dementia, but for lab eval today per daughter request including cbc, ua, bmp  Essential hypertension BP Readings from Last 3 Encounters:  10/14/24 126/78  08/02/24 138/68  07/29/24 122/66   Stable, pt to continue medical treatment toprol  xl 50 every day, micardis  10 qd   Left ear impacted cerumen Mild to mod, resolved with irrigation  Followup: Return in about 6 months (around 04/13/2025).  Lynwood Norleen, MD 10/14/2024 8:10 PM [facility] [place] [facility] - [facility] Internal Medicine     [1] No Known Allergies [2]  Current Outpatient Medications on File Prior to Visit  Medication Sig Dispense Refill   aspirin  81 MG tablet Take 81 mg by mouth daily.     b complex vitamins tablet Take 1 tablet by mouth daily.     Cholecalciferol  (VITAMIN D ) 2000 UNITS CAPS Take 2,000 Units by mouth daily.  primidone  (MYSOLINE ) 50 MG tablet Take 1/2 tablet at  bedtime for 1 week and then increase to 1 tablet at bedtime thereafter. 30 tablet 11   triamcinolone  (NASACORT ) 55 MCG/ACT AERO nasal inhaler Place 2 sprays into the nose daily. (Patient not taking: Reported on 08/02/2024) 1 each 12   No current facility-administered medications on file prior to visit.

## 2024-10-14 NOTE — Patient Instructions (Signed)
 Your left ear was irrigated today  Please continue all other medications as before, and refills have been done if requested.  Please have the pharmacy call with any other refills you may need  Please keep your appointments with your specialists as you may have planned - Neurology  Please go to the LAB at the blood drawing area for the tests to be done  You will be contacted by phone if any changes need to be made immediately.  Otherwise, you will receive a letter about your results with an explanation, but please check with MyChart first.

## 2024-10-14 NOTE — Progress Notes (Signed)
 The test results show that your current treatment is OK, as the tests are stable.  Please continue the same plan.  There is no other need for change of treatment or further evaluation based on these results, at this time.  thanks

## 2024-10-14 NOTE — Assessment & Plan Note (Signed)
 Lab Results  Component Value Date   VITAMINB12 410 10/14/2024   Stable, cont oral replacement - b12 1000 mcg qd

## 2024-10-14 NOTE — Assessment & Plan Note (Signed)
 I suspect mild worsening dementia, but for lab eval today per daughter request including cbc, ua, bmp

## 2025-01-06 ENCOUNTER — Ambulatory Visit: Admitting: Physician Assistant
